# Patient Record
Sex: Male | Born: 1970 | Race: White | Hispanic: No | Marital: Single | Smoking: Former smoker
Health system: Southern US, Community
[De-identification: ages and names within clinical notes are randomized; demographics above are authoritative.]

## PROBLEM LIST (undated history)

## (undated) DIAGNOSIS — G893 Neoplasm related pain (acute) (chronic): Secondary | ICD-10-CM

## (undated) DIAGNOSIS — Z5111 Encounter for antineoplastic chemotherapy: Secondary | ICD-10-CM

## (undated) DIAGNOSIS — J189 Pneumonia, unspecified organism: Secondary | ICD-10-CM

## (undated) DIAGNOSIS — J9819 Other pulmonary collapse: Secondary | ICD-10-CM

## (undated) DIAGNOSIS — Z7189 Other specified counseling: Secondary | ICD-10-CM

## (undated) DIAGNOSIS — C801 Malignant (primary) neoplasm, unspecified: Secondary | ICD-10-CM

## (undated) HISTORY — PX: CHEST TUBE INSERTION: SHX231

## (undated) HISTORY — DX: Neoplasm related pain (acute) (chronic): G89.3

## (undated) HISTORY — DX: Encounter for antineoplastic chemotherapy: Z51.11

## (undated) HISTORY — DX: Other specified counseling: Z71.89

## (undated) HISTORY — PX: TYMPANOSTOMY TUBE PLACEMENT: SHX32

## (undated) HISTORY — PX: FRACTURE SURGERY: SHX138

## (undated) HISTORY — PX: HERNIA REPAIR: SHX51

## (undated) SURGERY — VIDEO BRONCHOSCOPY WITHOUT FLUORO
Anesthesia: Moderate Sedation

---

## 2001-09-20 ENCOUNTER — Emergency Department (HOSPITAL_COMMUNITY): Admission: EM | Admit: 2001-09-20 | Discharge: 2001-09-20 | Payer: Self-pay | Admitting: Emergency Medicine

## 2001-09-20 ENCOUNTER — Encounter: Payer: Self-pay | Admitting: Emergency Medicine

## 2002-08-23 ENCOUNTER — Emergency Department (HOSPITAL_COMMUNITY): Admission: EM | Admit: 2002-08-23 | Discharge: 2002-08-23 | Payer: Self-pay | Admitting: Emergency Medicine

## 2012-11-03 ENCOUNTER — Emergency Department (HOSPITAL_BASED_OUTPATIENT_CLINIC_OR_DEPARTMENT_OTHER)
Admission: EM | Admit: 2012-11-03 | Discharge: 2012-11-03 | Disposition: A | Discharge status: Home / Self Care | Payer: Self-pay | Attending: Emergency Medicine | Admitting: Emergency Medicine

## 2012-11-03 ENCOUNTER — Encounter (HOSPITAL_BASED_OUTPATIENT_CLINIC_OR_DEPARTMENT_OTHER): Payer: Self-pay | Admitting: *Deleted

## 2012-11-03 ENCOUNTER — Emergency Department (HOSPITAL_BASED_OUTPATIENT_CLINIC_OR_DEPARTMENT_OTHER): Payer: Self-pay

## 2012-11-03 DIAGNOSIS — R0781 Pleurodynia: Secondary | ICD-10-CM

## 2012-11-03 DIAGNOSIS — T148XXA Other injury of unspecified body region, initial encounter: Secondary | ICD-10-CM

## 2012-11-03 DIAGNOSIS — S62639A Displaced fracture of distal phalanx of unspecified finger, initial encounter for closed fracture: Secondary | ICD-10-CM | POA: Insufficient documentation

## 2012-11-03 DIAGNOSIS — S0190XA Unspecified open wound of unspecified part of head, initial encounter: Secondary | ICD-10-CM | POA: Insufficient documentation

## 2012-11-03 DIAGNOSIS — S6990XA Unspecified injury of unspecified wrist, hand and finger(s), initial encounter: Secondary | ICD-10-CM | POA: Insufficient documentation

## 2012-11-03 DIAGNOSIS — IMO0002 Reserved for concepts with insufficient information to code with codable children: Secondary | ICD-10-CM | POA: Insufficient documentation

## 2012-11-03 DIAGNOSIS — F17200 Nicotine dependence, unspecified, uncomplicated: Secondary | ICD-10-CM | POA: Insufficient documentation

## 2012-11-03 DIAGNOSIS — Z23 Encounter for immunization: Secondary | ICD-10-CM | POA: Insufficient documentation

## 2012-11-03 DIAGNOSIS — S298XXA Other specified injuries of thorax, initial encounter: Secondary | ICD-10-CM | POA: Insufficient documentation

## 2012-11-03 DIAGNOSIS — S0990XA Unspecified injury of head, initial encounter: Secondary | ICD-10-CM | POA: Insufficient documentation

## 2012-11-03 MED ORDER — TETANUS-DIPHTH-ACELL PERTUSSIS 5-2.5-18.5 LF-MCG/0.5 IM SUSP
0.5000 mL | Freq: Once | INTRAMUSCULAR | Status: AC
  Administered 2012-11-03: 0.5 mL via INTRAMUSCULAR
  Filled 2012-11-03: qty 0.5

## 2012-11-03 MED ORDER — OXYCODONE-ACETAMINOPHEN 5-325 MG PO TABS: 1.0000 | ORAL_TABLET | ORAL | Status: DC | PRN

## 2012-11-03 MED ORDER — OXYCODONE-ACETAMINOPHEN 5-325 MG PO TABS
1.0000 | ORAL_TABLET | Freq: Once | ORAL | Status: AC
  Administered 2012-11-03: 1 via ORAL
  Filled 2012-11-03 (×2): qty 1
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
## 2012-11-04 NOTE — ED Provider Notes (Signed)
Medical screening examination/treatment/procedure(s) were performed by non-physician practitioner and as supervising physician I was immediately available for consultation/collaboration.   Dierdre Mccalip W. Eleen Litz, MD 11/04/12 0927 

## 2016-04-15 ENCOUNTER — Encounter (HOSPITAL_BASED_OUTPATIENT_CLINIC_OR_DEPARTMENT_OTHER): Payer: Self-pay | Admitting: Emergency Medicine

## 2016-04-15 ENCOUNTER — Emergency Department (HOSPITAL_BASED_OUTPATIENT_CLINIC_OR_DEPARTMENT_OTHER): Payer: Medicaid Other

## 2016-04-15 ENCOUNTER — Inpatient Hospital Stay (HOSPITAL_BASED_OUTPATIENT_CLINIC_OR_DEPARTMENT_OTHER)
Admission: EM | Admit: 2016-04-15 | Discharge: 2016-04-23 | DRG: 871 | Disposition: A | Discharge status: Home / Self Care | Payer: Medicaid Other | Attending: Internal Medicine | Admitting: Internal Medicine

## 2016-04-15 DIAGNOSIS — D72829 Elevated white blood cell count, unspecified: Secondary | ICD-10-CM | POA: Diagnosis present

## 2016-04-15 DIAGNOSIS — R509 Fever, unspecified: Secondary | ICD-10-CM | POA: Diagnosis present

## 2016-04-15 DIAGNOSIS — F40240 Claustrophobia: Secondary | ICD-10-CM | POA: Diagnosis present

## 2016-04-15 DIAGNOSIS — J9819 Other pulmonary collapse: Secondary | ICD-10-CM | POA: Diagnosis present

## 2016-04-15 DIAGNOSIS — R918 Other nonspecific abnormal finding of lung field: Secondary | ICD-10-CM

## 2016-04-15 DIAGNOSIS — R05 Cough: Secondary | ICD-10-CM | POA: Diagnosis present

## 2016-04-15 DIAGNOSIS — J9809 Other diseases of bronchus, not elsewhere classified: Secondary | ICD-10-CM | POA: Diagnosis present

## 2016-04-15 DIAGNOSIS — R634 Abnormal weight loss: Secondary | ICD-10-CM | POA: Diagnosis present

## 2016-04-15 DIAGNOSIS — R627 Adult failure to thrive: Secondary | ICD-10-CM | POA: Diagnosis present

## 2016-04-15 DIAGNOSIS — R945 Abnormal results of liver function studies: Secondary | ICD-10-CM | POA: Diagnosis present

## 2016-04-15 DIAGNOSIS — F17210 Nicotine dependence, cigarettes, uncomplicated: Secondary | ICD-10-CM | POA: Diagnosis present

## 2016-04-15 DIAGNOSIS — J181 Lobar pneumonia, unspecified organism: Secondary | ICD-10-CM | POA: Diagnosis not present

## 2016-04-15 DIAGNOSIS — R7989 Other specified abnormal findings of blood chemistry: Secondary | ICD-10-CM | POA: Diagnosis present

## 2016-04-15 DIAGNOSIS — D473 Essential (hemorrhagic) thrombocythemia: Secondary | ICD-10-CM | POA: Diagnosis present

## 2016-04-15 DIAGNOSIS — A419 Sepsis, unspecified organism: Secondary | ICD-10-CM | POA: Diagnosis present

## 2016-04-15 DIAGNOSIS — Y95 Nosocomial condition: Secondary | ICD-10-CM | POA: Diagnosis present

## 2016-04-15 DIAGNOSIS — R0602 Shortness of breath: Secondary | ICD-10-CM | POA: Diagnosis not present

## 2016-04-15 DIAGNOSIS — D630 Anemia in neoplastic disease: Secondary | ICD-10-CM | POA: Diagnosis present

## 2016-04-15 DIAGNOSIS — C3492 Malignant neoplasm of unspecified part of left bronchus or lung: Secondary | ICD-10-CM

## 2016-04-15 DIAGNOSIS — J440 Chronic obstructive pulmonary disease with acute lower respiratory infection: Secondary | ICD-10-CM | POA: Diagnosis present

## 2016-04-15 DIAGNOSIS — G893 Neoplasm related pain (acute) (chronic): Secondary | ICD-10-CM | POA: Diagnosis present

## 2016-04-15 DIAGNOSIS — E43 Unspecified severe protein-calorie malnutrition: Secondary | ICD-10-CM | POA: Insufficient documentation

## 2016-04-15 DIAGNOSIS — C3482 Malignant neoplasm of overlapping sites of left bronchus and lung: Secondary | ICD-10-CM | POA: Diagnosis not present

## 2016-04-15 DIAGNOSIS — R059 Cough, unspecified: Secondary | ICD-10-CM | POA: Diagnosis present

## 2016-04-15 DIAGNOSIS — Z510 Encounter for antineoplastic radiation therapy: Secondary | ICD-10-CM | POA: Diagnosis present

## 2016-04-15 DIAGNOSIS — D649 Anemia, unspecified: Secondary | ICD-10-CM | POA: Diagnosis present

## 2016-04-15 DIAGNOSIS — D638 Anemia in other chronic diseases classified elsewhere: Secondary | ICD-10-CM | POA: Diagnosis present

## 2016-04-15 DIAGNOSIS — Z6823 Body mass index (BMI) 23.0-23.9, adult: Secondary | ICD-10-CM | POA: Diagnosis not present

## 2016-04-15 DIAGNOSIS — J189 Pneumonia, unspecified organism: Secondary | ICD-10-CM | POA: Diagnosis present

## 2016-04-15 DIAGNOSIS — F17200 Nicotine dependence, unspecified, uncomplicated: Secondary | ICD-10-CM | POA: Diagnosis present

## 2016-04-15 DIAGNOSIS — J9811 Atelectasis: Secondary | ICD-10-CM

## 2016-04-15 DIAGNOSIS — D72828 Other elevated white blood cell count: Secondary | ICD-10-CM

## 2016-04-15 HISTORY — DX: Pneumonia, unspecified organism: J18.9

## 2016-04-15 LAB — URINALYSIS, ROUTINE W REFLEX MICROSCOPIC
Bilirubin Urine: NEGATIVE
Glucose, UA: NEGATIVE mg/dL
Hgb urine dipstick: NEGATIVE
Ketones, ur: NEGATIVE mg/dL
Leukocytes, UA: NEGATIVE
Nitrite: NEGATIVE
Protein, ur: NEGATIVE mg/dL
Specific Gravity, Urine: >1.046 — ABNORMAL HIGH (ref 1.005–1.030)
pH: 7.5 (ref 5.0–8.0)

## 2016-04-15 LAB — TROPONIN I

## 2016-04-15 LAB — CBC WITH DIFFERENTIAL/PLATELET
BASOS PCT: 0 %
Basophils Absolute: 0.1 10*3/uL (ref 0.0–0.1)
Eosinophils Absolute: 0 10*3/uL (ref 0.0–0.7)
Eosinophils Relative: 0 %
HEMATOCRIT: 31.2 % — AB (ref 39.0–52.0)
Hemoglobin: 10.2 g/dL — ABNORMAL LOW (ref 13.0–17.0)
LYMPHS PCT: 8 %
Lymphs Abs: 1.8 10*3/uL (ref 0.7–4.0)
MCH: 30.4 pg (ref 26.0–34.0)
MCHC: 32.7 g/dL (ref 30.0–36.0)
MCV: 93.1 fL (ref 78.0–100.0)
MONO ABS: 1.4 10*3/uL — AB (ref 0.1–1.0)
MONOS PCT: 6 %
NEUTROS ABS: 20 10*3/uL — AB (ref 1.7–7.7)
Neutrophils Relative %: 86 %
Platelets: 585 10*3/uL — ABNORMAL HIGH (ref 150–400)
RBC: 3.35 MIL/uL — ABNORMAL LOW (ref 4.22–5.81)
RDW: 13.1 % (ref 11.5–15.5)
WBC: 23.2 10*3/uL — ABNORMAL HIGH (ref 4.0–10.5)

## 2016-04-15 LAB — CREATININE, SERUM: CREATININE: 0.74 mg/dL (ref 0.61–1.24)

## 2016-04-15 LAB — COMPREHENSIVE METABOLIC PANEL
ALT: 139 U/L — ABNORMAL HIGH (ref 17–63)
ANION GAP: 10 (ref 5–15)
AST: 74 U/L — ABNORMAL HIGH (ref 15–41)
Albumin: 2.3 g/dL — ABNORMAL LOW (ref 3.5–5.0)
Alkaline Phosphatase: 124 U/L (ref 38–126)
BILIRUBIN TOTAL: 0.4 mg/dL (ref 0.3–1.2)
BUN: 13 mg/dL (ref 6–20)
CO2: 24 mmol/L (ref 22–32)
Calcium: 8.5 mg/dL — ABNORMAL LOW (ref 8.9–10.3)
Chloride: 98 mmol/L — ABNORMAL LOW (ref 101–111)
Creatinine, Ser: 0.69 mg/dL (ref 0.61–1.24)
GFR calc Af Amer: >60 mL/min (ref >60)
Glucose, Bld: 170 mg/dL — ABNORMAL HIGH (ref 65–99)
POTASSIUM: 4.3 mmol/L (ref 3.5–5.1)
Sodium: 132 mmol/L — ABNORMAL LOW (ref 135–145)
TOTAL PROTEIN: 6.6 g/dL (ref 6.5–8.1)

## 2016-04-15 LAB — CBC
HEMATOCRIT: 27.8 % — AB (ref 39.0–52.0)
HEMOGLOBIN: 9.2 g/dL — AB (ref 13.0–17.0)
MCH: 30.6 pg (ref 26.0–34.0)
MCHC: 33.1 g/dL (ref 30.0–36.0)
MCV: 92.4 fL (ref 78.0–100.0)
Platelets: 541 10*3/uL — ABNORMAL HIGH (ref 150–400)
RBC: 3.01 MIL/uL — AB (ref 4.22–5.81)
RDW: 13.6 % (ref 11.5–15.5)
WBC: 20.7 10*3/uL — AB (ref 4.0–10.5)

## 2016-04-15 LAB — I-STAT CG4 LACTIC ACID, ED: Lactic Acid, Venous: 2.13 mmol/L (ref 0.5–1.9)

## 2016-04-15 MED ORDER — ALBUTEROL SULFATE (2.5 MG/3ML) 0.083% IN NEBU
5.0000 mg | INHALATION_SOLUTION | Freq: Once | RESPIRATORY_TRACT | Status: AC
  Administered 2016-04-15: 5 mg via RESPIRATORY_TRACT
  Filled 2016-04-15: qty 6

## 2016-04-15 MED ORDER — ENOXAPARIN SODIUM 40 MG/0.4ML ~~LOC~~ SOLN
40.0000 mg | SUBCUTANEOUS | Status: DC
  Administered 2016-04-16 – 2016-04-23 (×7): 40 mg via SUBCUTANEOUS
  Filled 2016-04-15 (×7): qty 0.4

## 2016-04-15 MED ORDER — VANCOMYCIN HCL 500 MG IV SOLR
INTRAVENOUS | Status: AC
  Filled 2016-04-15: qty 500

## 2016-04-15 MED ORDER — PIPERACILLIN-TAZOBACTAM 3.375 G IVPB
3.3750 g | Freq: Three times a day (TID) | INTRAVENOUS | Status: AC
  Administered 2016-04-15 – 2016-04-22 (×22): 3.375 g via INTRAVENOUS
  Filled 2016-04-15 (×24): qty 50

## 2016-04-15 MED ORDER — SODIUM CHLORIDE 0.9 % IV SOLN
1000.0000 mL | INTRAVENOUS | Status: DC
  Administered 2016-04-15 – 2016-04-18 (×7): 1000 mL via INTRAVENOUS

## 2016-04-15 MED ORDER — PIPERACILLIN-TAZOBACTAM 3.375 G IVPB 30 MIN
3.3750 g | Freq: Once | INTRAVENOUS | Status: AC
  Administered 2016-04-15: 3.375 g via INTRAVENOUS
  Filled 2016-04-15 (×2): qty 50

## 2016-04-15 MED ORDER — VANCOMYCIN HCL 500 MG IV SOLR
INTRAVENOUS | Status: AC
  Administered 2016-04-15: 500 mg
  Filled 2016-04-15: qty 500

## 2016-04-15 MED ORDER — OXYCODONE-ACETAMINOPHEN 5-325 MG PO TABS
1.0000 | ORAL_TABLET | ORAL | Status: DC | PRN
  Administered 2016-04-15 – 2016-04-17 (×7): 1 via ORAL
  Filled 2016-04-15 (×7): qty 1

## 2016-04-15 MED ORDER — VANCOMYCIN HCL IN DEXTROSE 1-5 GM/200ML-% IV SOLN
INTRAVENOUS | Status: AC
  Administered 2016-04-15: 1 g
  Filled 2016-04-15: qty 200

## 2016-04-15 MED ORDER — VANCOMYCIN HCL 10 G IV SOLR
20.0000 mg/kg | Freq: Once | INTRAVENOUS | Status: DC
Start: 2016-04-15 — End: 2016-04-16
  Filled 2016-04-15: qty 1270

## 2016-04-15 MED ORDER — SODIUM CHLORIDE 0.9 % IV BOLUS (SEPSIS)
1000.0000 mL | Freq: Once | INTRAVENOUS | Status: AC
  Administered 2016-04-15: 1000 mL via INTRAVENOUS

## 2016-04-15 MED ORDER — IOPAMIDOL (ISOVUE-370) INJECTION 76%
100.0000 mL | Freq: Once | INTRAVENOUS | Status: AC | PRN
  Administered 2016-04-15: 100 mL via INTRAVENOUS

## 2016-04-15 NOTE — ED Notes (Signed)
Report to Pamala Hurry, Therapist, sports at [employer].

## 2016-04-15 NOTE — Progress Notes (Signed)
Accepted this patient to [facility] from [facility], has no chron medical conditions, on no medications , hx PNA earlier this year, here now with fevers 103, cough and white-out L chest on CXR. CT shows prob lobar collapse on L and ? concern for endobronchial mass.  WBC 23k, prob has PNA as well.  BP's ok, HR up, admitting to tele bed/ inpatient, has rec'd IV vanc/ zosyn so far.   Kelly Splinter MD [facility] pgr [phone] 04/15/2016, 7:19 PM

## 2016-04-15 NOTE — ED Notes (Signed)
Patient transported to CT 

## 2016-04-15 NOTE — Progress Notes (Signed)
Admitted to room 1413 from [facility], patient A/O x4, noted on 3 liters of oxygen, dry cough noted. Patient reports " having high grade temps at night for at least a month.

## 2016-04-15 NOTE — ED Notes (Signed)
RESP 24, DISREGARD "0" in validated VS

## 2016-04-15 NOTE — ED Notes (Signed)
Critical Lactic Acid 2.13 results reported to MD and Vincente Liberty, RN

## 2016-04-15 NOTE — ED Triage Notes (Signed)
Patient states that he has had a cough x 1 month, lost 15 pounds in that last month. He is coughing up phglem. Patient reports that he has cut back on smoking because of this, Patient states that he is always SOB
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
## 2016-04-15 NOTE — ED Provider Notes (Signed)
[facility] DEPT [facility] Provider Note   CSN: [id number] Arrival date & time: 04/15/16  1548  By signing my name below, I, Emmanuella Mensah, attest that this documentation has been prepared under the direction and in the presence of [employer], PA-C. Electronically Signed: Judithann Sauger, ED Scribe. 04/15/16. 4:32 PM.   History   Chief Complaint Chief Complaint  Patient presents with  . Cough    HPI Comments: Douglas Kelly is a 45 y.o. male with a hx of pneumonia (May 2017) who presents to the Emergency Department complaining of gradually worsening persistent moderate productive cough with non-bloody, clear sputum onset one month ago. He reports associated left lower chest pain he describes as stabbing, shortness of breath, and persistent fever (highest 103.9 PTA). He notes that the left chest pain is worse with movement and deep breathing. He adds that he had two episodes non-bloody vomiting several days ago and since onset of his cough, he has a decreased appetite. No alleviating factors noted. Pt states that he has tried aspirin PTA with no relief. He has NKDA. He reports that he had these symptoms in May 2017 when he was diagnosed with pneumonia but these symptoms are worse than then. He denies any recent long car/plane trips, immobilizations, surgeries, or hx of cancer. He reports that he is a current smoker but has cut back from 2 pack/day to several cigarettes/day. He denies any abdominal pain, dysuria, hematuria, leg swelling, ear pain, generalized rash, or any other symptoms. Patient reports a 15 pound weight loss in the last month.  The history is provided by the patient. No language interpreter was used.    Past Medical History:  Diagnosis Date  . Pneumonia     Patient Active Problem List   Diagnosis Date Noted  . Cough 04/15/2016  . Fever 04/15/2016  . Leukocytosis 04/15/2016  . Pneumonia 04/15/2016  . Loss of weight 04/15/2016  . Normocytic anemia 04/15/2016  .  LFTs abnormal 04/15/2016  . Smoker 04/15/2016    History reviewed. No pertinent surgical history.     Home Medications    Prior to Admission medications   Medication Sig Start Date End Date Taking? Authorizing Provider  oxyCODONE-acetaminophen (PERCOCET/ROXICET) 5-325 MG per tablet Take 1 tablet by mouth every 4 (four) hours as needed for pain. Patient not taking: Reported on 04/15/2016 11/03/12   Glendell Docker, NP    Family History History reviewed. No pertinent family history.  Social History Social History  Substance Use Topics  . Smoking status: Current Every Day Smoker    Packs/day: 1.00    Types: Cigarettes  . Smokeless tobacco: Never Used  . Alcohol use No     Allergies   Patient has no known allergies.   Review of Systems Review of Systems  Constitutional: Positive for fever. Negative for chills.  HENT: Negative for ear pain, facial swelling and sore throat.   Respiratory: Positive for cough and shortness of breath.   Cardiovascular: Positive for chest pain. Negative for leg swelling.  Gastrointestinal: Positive for vomiting. Negative for abdominal pain and nausea.  Genitourinary: Negative for dysuria and hematuria.  Musculoskeletal: Positive for arthralgias. Negative for back pain.  Skin: Negative for rash and wound.  Neurological: Negative for headaches.  Psychiatric/Behavioral: The patient is not nervous/anxious.      Physical Exam Updated Vital Signs BP (!) 120/56 (BP Location: Right Arm)   Pulse 100   Temp (!) 100.5 F (38.1 C) (Oral)   Resp (!) 22   Ht  $'5\' 8"'G$  (1.727 m)   Wt 68.1 kg   SpO2 97%   BMI 22.83 kg/m   Physical Exam  Constitutional: He appears well-developed and well-nourished. No distress.  HENT:  Head: Normocephalic and atraumatic.  Mouth/Throat: Oropharynx is clear and moist. No oropharyngeal exudate.  Eyes: Conjunctivae are normal. Pupils are equal, round, and reactive to light. Right eye exhibits no discharge. Left eye  exhibits no discharge. No scleral icterus.  Neck: Normal range of motion. Neck supple. No thyromegaly present.  Cardiovascular: Normal rate, regular rhythm, normal heart sounds and intact distal pulses.  Exam reveals no gallop and no friction rub.   No murmur heard. Pulmonary/Chest: Effort normal. No stridor. No respiratory distress. He has decreased breath sounds (L lung). He has no wheezes. He has no rales.    Abdominal: Soft. Bowel sounds are normal. He exhibits no distension. There is no tenderness. There is no rebound and no guarding.  Musculoskeletal: He exhibits no edema.  Lymphadenopathy:    He has no cervical adenopathy.  Neurological: He is alert. Coordination normal.  Skin: Skin is warm and dry. No rash noted. He is not diaphoretic. No pallor.  Psychiatric: He has a normal mood and affect.  Nursing note and vitals reviewed.    ED Treatments / Results  DIAGNOSTIC STUDIES: Oxygen Saturation is 95% on RA, adequate by my interpretation.    COORDINATION OF CARE: 4:20 PM- Pt advised of plan for treatment and pt agrees. Pt will receive lab work, chest x-ray, and EKG for further evaluation.    Labs (all labs ordered are listed, but only abnormal results are displayed) Labs Reviewed  CBC WITH DIFFERENTIAL/PLATELET - Abnormal; Notable for the following:       Result Value   WBC 23.2 (*)    RBC 3.35 (*)    Hemoglobin 10.2 (*)    HCT 31.2 (*)    Platelets 585 (*)    Neutro Abs 20.0 (*)    Monocytes Absolute 1.4 (*)    All other components within normal limits  COMPREHENSIVE METABOLIC PANEL - Abnormal; Notable for the following:    Sodium 132 (*)    Chloride 98 (*)    Glucose, Bld 170 (*)    Calcium 8.5 (*)    Albumin 2.3 (*)    AST 74 (*)    ALT 139 (*)    All other components within normal limits  URINALYSIS, ROUTINE W REFLEX MICROSCOPIC - Abnormal; Notable for the following:    Specific Gravity, Urine >1.046 (*)    All other components within normal limits  CBC -  Abnormal; Notable for the following:    WBC 20.7 (*)    RBC 3.01 (*)    Hemoglobin 9.2 (*)    HCT 27.8 (*)    Platelets 541 (*)    All other components within normal limits  I-STAT CG4 LACTIC ACID, ED - Abnormal; Notable for the following:    Lactic Acid, Venous 2.13 (*)    All other components within normal limits  CULTURE, BLOOD (ROUTINE X 2)  CULTURE, BLOOD (ROUTINE X 2)  URINE CULTURE  CULTURE, BLOOD (ROUTINE X 2)  CULTURE, BLOOD (ROUTINE X 2)  CULTURE, EXPECTORATED SPUTUM-ASSESSMENT  GRAM STAIN  TROPONIN I  CREATININE, SERUM  HIV ANTIBODY (ROUTINE TESTING)  STREP PNEUMONIAE URINARY ANTIGEN  VITAMIN B12  FOLATE  IRON AND TIBC  FERRITIN  RETICULOCYTES  BASIC METABOLIC PANEL    EKG  EKG Interpretation None       Radiology  Dg Chest 2 View  Result Date: 04/15/2016 CLINICAL DATA:  Left-sided chest pain and dyspnea x5 months. History of pneumonia 7 months ago that filled entire left lung. EXAM: CHEST  2 VIEW COMPARISON:  11/03/2012 FINDINGS: There is volume loss with tracheal deviation to the left and near complete whiteout of the left hemithorax. Compensatory hyperinflation of the right lung. Only a few aerated portions of left upper lobe are noted near the apex. The entire heart is obscured as is the aorta. No suspicious osseous abnormality. IMPRESSION: Near complete whiteout of the left lung with volume loss and mediastinal shift to the left. Compensatory hyperinflation of the normal-appearing right lung. Electronically Signed   By: Ashley Royalty M.D.   On: 04/15/2016 16:19   Ct Angio Chest Pe W And/or Wo Contrast  Result Date: 04/15/2016 CLINICAL DATA:  Cough for 5 months with dyspnea and weight loss. Left-sided chest pain. Current smoker. EXAM: CT ANGIOGRAPHY CHEST WITH CONTRAST TECHNIQUE: Multidetector CT imaging of the chest was performed using the standard protocol during bolus administration of intravenous contrast. Multiplanar CT image reconstructions and MIPs were  obtained to evaluate the vascular anatomy. CONTRAST:  100 cc of Isovue 370 COMPARISON:  Same day CXR FINDINGS: Cardiovascular: The heart shifted toward the left hemithorax secondary to left lung collapse and volume loss. No acute pulmonary embolus. No aortic aneurysm or dissection. No pericardial effusion. Mediastinum/Nodes: There is prevascular lymphadenopathy measuring up to 12 mm short axis. No paratracheal, supraclavicular nor axillary lymphadenopathy. Lungs/Pleura: There is volume loss secondary to atelectasis and left lung collapse of the left upper lobe, lingula and left lower lobe. Tubular branching air like opacities are seen within the left lower consistent with air within bronchi and bronchioles. No confluent areas of cavitation noted to suggest pulmonary necrosis on current exam. Soft tissue densities are noted abruptly terminating the distal left main stem bronchus. An endoluminal mass or mucous causing the left lung collapse is not excluded. Faint ground-glass 7 mm in average and 5 mm in average nodular densities in the right upper lobe are noted, series 4 image 48 and 49. No effusion or pneumothorax on the right. Upper Abdomen: No acute abnormality within the abdomen. No enhancing hepatic mass. No definite adrenal nodule to the extent that they are included. Musculoskeletal: Nonacute Review of the MIP images confirms the above findings. IMPRESSION: 1. Left lung atelectasis/collapse with volume loss and shift of the mediastinum to the left. Soft tissue densities in the distal mainstem bronchus may be secondary to is inspissation. Endobronchial mass or lesion is not entirely excluded. No definite evidence of pulmonary necrosis. Mild prevascular lymphadenopathy. 2. No acute pulmonary embolus. 3. 7 mm and 5 mm right upper lobe ground-glass and nodular opacities. Initial follow-up with CT at 6-12 months is recommended to confirm persistence. If persistent, repeat CT is recommended every 2 years until 5  years of stability has been established. This recommendation follows the consensus statement: Guidelines for Management of Incidental Pulmonary Nodules Detected on CT Images: From the Fleischner Society 2017; Radiology 2017; 284:228-243. Electronically Signed   By: Ashley Royalty M.D.   On: 04/15/2016 18:17    Procedures Procedures (including critical care time)  Medications Ordered in ED Medications  vancomycin (VANCOCIN) 1,270 mg in sodium chloride 0.9 % 500 mL IVPB (1,270 mg Intravenous Not Given 04/15/16 1728)  vancomycin (VANCOCIN) 500 MG powder (not administered)  0.9 %  sodium chloride infusion (1,000 mLs Intravenous New Bag/Given 04/15/16 2333)  oxyCODONE-acetaminophen (PERCOCET/ROXICET) 5-325 MG per tablet  1 tablet (1 tablet Oral Given 04/15/16 2332)  enoxaparin (LOVENOX) injection 40 mg (not administered)  piperacillin-tazobactam (ZOSYN) IVPB 3.375 g (3.375 g Intravenous Given 04/15/16 2332)  albuterol (PROVENTIL) (2.5 MG/3ML) 0.083% nebulizer solution 5 mg (5 mg Nebulization Given 04/15/16 1648)  piperacillin-tazobactam (ZOSYN) IVPB 3.375 g (0 g Intravenous Stopped 04/15/16 1723)  vancomycin (VANCOCIN) 500 MG powder (500 mg  Given 04/15/16 1830)  vancomycin (VANCOCIN) 1-5 GM/200ML-% IVPB (  Stopped 04/15/16 1830)  iopamidol (ISOVUE-370) 76 % injection 100 mL (100 mLs Intravenous Contrast Given 04/15/16 1733)  sodium chloride 0.9 % bolus 1,000 mL (1,000 mLs Intravenous New Bag/Given 04/15/16 1905)    And  sodium chloride 0.9 % bolus 1,000 mL (1,000 mLs Intravenous Given 04/15/16 2110)     Initial Impression / Assessment and Plan / ED Course  Eliezer Mccoy, PA-C has reviewed the triage vital signs and the nursing notes.  Pertinent labs & imaging results that were available during my care of the patient were reviewed by me and considered in my medical decision making (see chart for details).  Clinical Course     CBC shows WBC 23.2. Hemoglobin 10.2. Lactate 2.13. CMP shows sodium 132,  chloride 98, glucose 170, AST 74, ALT 139, calcium 8.5, albumin 2.3. Troponin <0.03. UA shows specific gravity 1.046. CXR shows near complete white out of the left lung with volume loss and mediastinal shift to the left; compensatory hyperinflation of the normal-appearing right lung. CT angio shows "1. Left lung atelectasis/collapse with volume loss and shift of the mediastinum to the left. Soft tissue densities in the distal mainstem bronchus may be secondary to is inspissation. Endobronchial mass or lesion is not entirely excluded. No definite evidence of pulmonary necrosis. Mild prevascular lymphadenopathy. 2. No acute pulmonary embolus. 3. 7 mm and 5 mm right upper lobe ground-glass and nodular opacities. Initial follow-up with CT at 6-12 months is recommended to confirm persistence. If persistent, repeat CT is recommended every 2 years until 5 years of stability has been established." Vancomycin and Zosyn initiated in the ED. Concern for pneumonia versus possible underlying malignancy. I spoke with Dr. Lorrin Jackson who will admit the patient to [facility] telemetry for further evaluation and treatment. Patient will be transported via [place]. I discussed patient with Dr. Billy Fischer who guided the patient's management and agrees with plan.  Final Clinical Impressions(s) / ED Diagnoses   Final diagnoses:  Cough  Shortness of breath    New Prescriptions Current Discharge Medication List     I personally performed the services described in this documentation, which was scribed in my presence. The recorded information has been reviewed and is accurate.    [address] PA-C 04/16/16 5638    Gareth Morgan, MD 04/16/16 1329    Gareth Morgan, MD 04/16/16 1331

## 2016-04-16 DIAGNOSIS — J181 Lobar pneumonia, unspecified organism: Secondary | ICD-10-CM

## 2016-04-16 DIAGNOSIS — J9811 Atelectasis: Secondary | ICD-10-CM

## 2016-04-16 DIAGNOSIS — J189 Pneumonia, unspecified organism: Secondary | ICD-10-CM

## 2016-04-16 LAB — FOLATE: FOLATE: 14.1 ng/mL (ref >5.9)

## 2016-04-16 LAB — EXPECTORATED SPUTUM ASSESSMENT W GRAM STAIN, RFLX TO RESP C

## 2016-04-16 LAB — HIV ANTIBODY (ROUTINE TESTING W REFLEX): HIV Screen 4th Generation wRfx: NONREACTIVE

## 2016-04-16 LAB — BASIC METABOLIC PANEL
ANION GAP: 7 (ref 5–15)
BUN: 9 mg/dL (ref 6–20)
CALCIUM: 8 mg/dL — AB (ref 8.9–10.3)
CO2: 26 mmol/L (ref 22–32)
Chloride: 104 mmol/L (ref 101–111)
Creatinine, Ser: 0.72 mg/dL (ref 0.61–1.24)
GFR calc non Af Amer: >60 mL/min (ref >60)
Glucose, Bld: 156 mg/dL — ABNORMAL HIGH (ref 65–99)
POTASSIUM: 3.8 mmol/L (ref 3.5–5.1)
Sodium: 137 mmol/L (ref 135–145)

## 2016-04-16 LAB — IRON AND TIBC
IRON: 12 ug/dL — AB (ref 45–182)
SATURATION RATIOS: 10 % — AB (ref 17.9–39.5)
TIBC: 119 ug/dL — AB (ref 250–450)
UIBC: 107 ug/dL

## 2016-04-16 LAB — RETICULOCYTES
RBC.: 3.14 MIL/uL — AB (ref 4.22–5.81)
Retic Count, Absolute: 25.1 10*3/uL (ref 19.0–186.0)
Retic Ct Pct: 0.8 % (ref 0.4–3.1)

## 2016-04-16 LAB — STREP PNEUMONIAE URINARY ANTIGEN: STREP PNEUMO URINARY ANTIGEN: NEGATIVE

## 2016-04-16 LAB — FERRITIN: Ferritin: 918 ng/mL — ABNORMAL HIGH (ref 24–336)

## 2016-04-16 LAB — EXPECTORATED SPUTUM ASSESSMENT W REFEX TO RESP CULTURE

## 2016-04-16 LAB — APTT: APTT: 36 s (ref 24–36)

## 2016-04-16 LAB — PROTIME-INR
INR: 1.21
Prothrombin Time: 15.4 seconds — ABNORMAL HIGH (ref 11.4–15.2)

## 2016-04-16 LAB — VITAMIN B12: VITAMIN B 12: 330 pg/mL (ref 180–914)

## 2016-04-16 MED ORDER — ACETYLCYSTEINE 20 % IN SOLN
2.0000 mL | Freq: Three times a day (TID) | RESPIRATORY_TRACT | Status: DC
  Administered 2016-04-16 (×2): 2 mL via RESPIRATORY_TRACT
  Filled 2016-04-16 (×3): qty 4

## 2016-04-16 MED ORDER — BUDESONIDE 0.5 MG/2ML IN SUSP
0.5000 mg | Freq: Two times a day (BID) | RESPIRATORY_TRACT | Status: DC
  Administered 2016-04-16 – 2016-04-23 (×12): 0.5 mg via RESPIRATORY_TRACT
  Filled 2016-04-16 (×12): qty 2

## 2016-04-16 MED ORDER — NICOTINE 21 MG/24HR TD PT24
21.0000 mg | MEDICATED_PATCH | Freq: Every day | TRANSDERMAL | Status: DC
  Administered 2016-04-16 – 2016-04-23 (×7): 21 mg via TRANSDERMAL
  Filled 2016-04-16 (×7): qty 1

## 2016-04-16 MED ORDER — GUAIFENESIN ER 600 MG PO TB12
1200.0000 mg | ORAL_TABLET | Freq: Two times a day (BID) | ORAL | Status: DC
  Administered 2016-04-16 – 2016-04-23 (×14): 1200 mg via ORAL
  Filled 2016-04-16 (×14): qty 2

## 2016-04-16 MED ORDER — IPRATROPIUM-ALBUTEROL 0.5-2.5 (3) MG/3ML IN SOLN
3.0000 mL | RESPIRATORY_TRACT | Status: DC
  Administered 2016-04-16 (×2): 3 mL via RESPIRATORY_TRACT
  Filled 2016-04-16 (×2): qty 3

## 2016-04-16 MED ORDER — PREMIER PROTEIN SHAKE
11.0000 [oz_av] | Freq: Two times a day (BID) | ORAL | Status: DC
  Administered 2016-04-16 – 2016-04-23 (×9): 11 [oz_av] via ORAL
  Filled 2016-04-16 (×15): qty 325.31

## 2016-04-16 MED ORDER — ACETYLCYSTEINE 10 % IN SOLN
2.0000 mL | Freq: Three times a day (TID) | RESPIRATORY_TRACT | Status: DC
Start: 2016-04-16 — End: 2016-04-16
  Filled 2016-04-16: qty 4

## 2016-04-16 MED ORDER — VANCOMYCIN HCL IN DEXTROSE 1-5 GM/200ML-% IV SOLN
1000.0000 mg | Freq: Three times a day (TID) | INTRAVENOUS | Status: DC
  Administered 2016-04-16 – 2016-04-19 (×11): 1000 mg via INTRAVENOUS
  Filled 2016-04-16 (×10): qty 200

## 2016-04-16 NOTE — Consult Note (Signed)
Name: Douglas Kelly MRN: [id number] DOB: 11/09/1970    ADMISSION DATE:  04/15/2016 CONSULTATION DATE: 12/8  REFERRING MD :  [facility] Dr. 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 He was treated for Pna in 09/2015 with abx and dis improve but he has continued to cough with gray sputum, febrile to 103, night sweats, 20 lb weight loss.  Admitted and CT scan reveals near complete whiteout of left lung, air bronchogram's and 2 5-7 mm rt lung nodules.  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 He is relatively healthy otherwise. PCCM asked to evaluate. PAST MEDICAL HISTORY :   has a past medical history of Pneumonia.  has no past surgical history on file. Prior to Admission medications   Medication Sig Start Date End Date Taking? Authorizing Provider  oxyCODONE-acetaminophen (PERCOCET/ROXICET) 5-325 MG per tablet Take 1 tablet by mouth every 4 (four) hours as needed for pain. Patient not taking: Reported on 04/15/2016 11/03/12   Glendell Docker, NP   No Known Allergies  FAMILY HISTORY:  family history is not on file. SOCIAL HISTORY:  reports that he has been smoking Cigarettes.  He has been smoking about 1.00 pack per day. He has never used smokeless tobacco. He reports that he does not drink alcohol or use drugs.  REVIEW OF SYSTEMS:   10 point review of system taken, please see HPI for positives and negatives. .  SUBJECTIVE: NAD at rest  VITAL SIGNS: Temp:  [98 F (36.7 C)-101.4 F (38.6 C)] 98.4 F (36.9 C) (12/08 0630) Pulse Rate:  [80-118] 80 (12/08 0630) Resp:  [0-24] 22 (12/08 0630) BP: (110-124)/(56-70) 113/67 (12/08 0630) SpO2:  [90  %-98 %] 98 % (12/08 1020) Weight:  [140 lb (63.5 kg)-150 lb 2.1 oz (68.1 kg)] 150 lb 2.1 oz (68.1 kg) (12/07 2050)  PHYSICAL EXAMINATION: General:  Thin wm nad at rest Neuro:  No jvd/lan Cardiovascular:  HSR RRR Lungs:  Decreased bs left with dull to percussion left Abdomen:  Soft +bs Musculoskeletal:  Intact Skin:  Warm and dry   Recent Labs Lab 04/15/16 1616 04/15/16 2252 04/16/16 0454  NA 132*  --  137  K 4.3  --  3.8  CL 98*  --  104  CO2 24  --  26  BUN 13  --  9  CREATININE 0.69 0.74 0.72  GLUCOSE 170*  --  156*    Recent Labs Lab 04/15/16 1616 04/15/16 2252  HGB 10.2* 9.2*  HCT 31.2* 27.8*  WBC 23.2* 20.7*  PLT 585* 541*   Dg Chest 2 View  Result Date: 04/15/2016 CLINICAL DATA:  Left-sided chest pain and dyspnea x5 months. History of pneumonia 7 months ago that filled entire left lung. EXAM: CHEST  2 VIEW COMPARISON:  11/03/2012 FINDINGS: There is volume loss with tracheal deviation to the left and near complete whiteout of the left hemithorax. Compensatory hyperinflation of the right lung. Only a few aerated portions of left upper lobe are noted near the apex. The entire heart is obscured as is the aorta. No suspicious osseous abnormality. IMPRESSION: Near  complete whiteout of the left lung with volume loss and mediastinal shift to the left. Compensatory hyperinflation of the normal-appearing right lung. Electronically Signed   By: Ashley Royalty M.D.   On: 04/15/2016 16:19   Ct Angio Chest Pe W And/or Wo Contrast  Result Date: 04/15/2016 CLINICAL DATA:  Cough for 5 months with dyspnea and weight loss. Left-sided chest pain. Current smoker. EXAM: CT ANGIOGRAPHY CHEST WITH CONTRAST TECHNIQUE: Multidetector CT imaging of the chest was performed using the standard protocol during bolus administration of intravenous contrast. Multiplanar CT image reconstructions and MIPs were obtained to evaluate the vascular anatomy. CONTRAST:  100 cc of Isovue 370 COMPARISON:  Same day  CXR FINDINGS: Cardiovascular: The heart shifted toward the left hemithorax secondary to left lung collapse and volume loss. No acute pulmonary embolus. No aortic aneurysm or dissection. No pericardial effusion. Mediastinum/Nodes: There is prevascular lymphadenopathy measuring up to 12 mm short axis. No paratracheal, supraclavicular nor axillary lymphadenopathy. Lungs/Pleura: There is volume loss secondary to atelectasis and left lung collapse of the left upper lobe, lingula and left lower lobe. Tubular branching air like opacities are seen within the left lower consistent with air within bronchi and bronchioles. No confluent areas of cavitation noted to suggest pulmonary necrosis on current exam. Soft tissue densities are noted abruptly terminating the distal left main stem bronchus. An endoluminal mass or mucous causing the left lung collapse is not excluded. Faint ground-glass 7 mm in average and 5 mm in average nodular densities in the right upper lobe are noted, series 4 image 48 and 49. No effusion or pneumothorax on the right. Upper Abdomen: No acute abnormality within the abdomen. No enhancing hepatic mass. No definite adrenal nodule to the extent that they are included. Musculoskeletal: Nonacute Review of the MIP images confirms the above findings. IMPRESSION: 1. Left lung atelectasis/collapse with volume loss and shift of the mediastinum to the left. Soft tissue densities in the distal mainstem bronchus may be secondary to is inspissation. Endobronchial mass or lesion is not entirely excluded. No definite evidence of pulmonary necrosis. Mild prevascular lymphadenopathy. 2. No acute pulmonary embolus. 3. 7 mm and 5 mm right upper lobe ground-glass and nodular opacities. Initial follow-up with CT at 6-12 months is recommended to confirm persistence. If persistent, repeat CT is recommended every 2 years until 5 years of stability has been established. This recommendation follows the consensus statement:  Guidelines for Management of Incidental Pulmonary Nodules Detected on CT Images: From the Fleischner Society 2017; Radiology 2017; 284:228-243. Electronically Signed   By: Ashley Royalty M.D.   On: 04/15/2016 18:17    ASSESSMENT  Principal Problem:   Collapse of left lung, hx of pneumonia 5 months ago Active Problems:   Cough   Fever   Leukocytosis   Loss of weight   Normocytic anemia   LFTs abnormal   Smoker since age 14 up to 2 ppd now 5 cigarettes a day.   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 He was treated for Pna in 09/2015 with abx and dis improve but he has continued to cough with gray sputum, febrile to 103, night sweats, 20 lb weight loss.  Admitted and CT scan reveals near complete whiteout of left lung, air bronchogram's and 2 5-7 mm rt lung nodules.  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 He is relatively healthy otherwise. PCCM asked to evaluate.   PLAN: Agree with abx. Pulmonary toilet Sputum culture if obtainable He will FOB and possibly a Vats in future. MD to review.    Richardson Landry Minor ACNP Maryanna Shape PCCM Pager [phone] till 3 pm If no answer page [phone] 04/16/2016, 10:27 AM   ATTENDING NOTE / ATTESTATION NOTE :   I have discussed the case with the resident/APP  Richardson Landry Minor NP.   I agree with the resident/APP's  history, physical examination, assessment, and plans.    I have edited the above note and modified it according to our agreed history, physical examination, assessment and plan.   Briefly, patient with significant smoking history, 50 PY, down to 5 cigs/d,  treated with pneumonia as an outpatient in May 2017. He clinically improved but symptoms of cough and SOB recurred. He started coughing with yellow sputum last 2-3 months. He was more SOB than usual the last week  with associated  fevers.  30 lb weight loss over 2-3 mos 2/2 anorexia.  He presents with worsening shortness of breath. Chest CT scan with whiteout of the left lung. He has air bronchograms at the left mid to lower lung zones. Likely with mucous plug in the left upper lung zone. It's hard to decipher whether there is an endobronchial lesion in the left upper lung.   Patient seen, comfortable, not in distress. Vital signs stable. Decreased to absent breath sounds in the left lung. Good S1 and S2. Abdomen was benign. No edema. Rest of exam per Richardson Landry.   Assement/Plan : Left lung collapse in a smoker. History of pneumonia in May 2017. Differentials include: Underlying malignancy causing airway obstruction and postobstructive pneumonia. He can just be having mucous plug and subsequent PNA. Pt likely with COPD but not in exacerbation.  - Pt is currently comfortable and not requiring a lot of o2.  He is just on 2L and o2 sats are > 90%.  - Plan on doing diagnostic bronchoscopy on Monday, Dec 11, 9am c/o Dr. Lake Bells.  - Cont abx pending culture (Vanc/Zosyn) - CPT q4, Mucomyst q8, Duoneb q4, pulmicort BID - CXR on Mon day prior to bronch.  - will check coags now prior to bronch   Family :Family updated at length today.  Plan discussed with patient and his girlfriend and pt's sister.   PCCM will not follow him over the weekend unless if with clinical deterioration.   Monica Becton, MD 04/16/2016, 1:13 PM [place] [facility] Pager [phone] After 3 pm or if no answer, call [phone]

## 2016-04-16 NOTE — Progress Notes (Signed)
Initial Nutrition Assessment  DOCUMENTATION CODES:   Severe malnutrition in context of acute illness/injury  INTERVENTION:  - Will order Premier Protein BID, each supplement provides 160 kcal and 30 grams of protein.  - Continue to encourage PO intakes of meals and supplements. - RD will continue to monitor for additional nutrition-related needs.  NUTRITION DIAGNOSIS:   Unintentional weight loss related to poor appetite, chronic illness as evidenced by per patient/family report, percent weight loss.  GOAL:   Patient will meet greater than or equal to 90% of their needs  MONITOR:   PO intake, Supplement acceptance, Weight trends, Labs, I & O's  REASON FOR ASSESSMENT:   Malnutrition Screening Tool  ASSESSMENT:   45 y.o. male with no sig PMHx, on no medications presented to [facility] with 3-5 month history of cough, sweats , wt loss and poor appetite.  CXR showed complete white out of L chest with mediastinal L shift consistent with some lung collapse.  CT chest showed L-sided lobar collapse with L shift, air bronchograms.  Pt seen for MST. BMI indicates normal weight. No intakes documented since admission, but pt reports almost 100% completion of breakfast (omelete, grits, white toast, banana, strawberry yogurt, coffee, and hot chocolate). Pt received lunch tray as RD was entering his room. Significant other is at bedside and has ordered a guest tray for lunch; unsure if some of the intake of breakfast meal could have been shared rather than fully consumed by the patient.   He states that over the past "few months" he has had symptoms outlined above, from H&P. He states that he usually has a very good appetite and would eat a large breakfast and have 2 plates of food each for lunch and for dinner. He states that over the past 1 month he has lost 20 lbs d/t symptoms and decreasing appetite and intakes. He reports that for the past ~1 month he mainly has only eaten a few bites at  each meal which is very unusual for him. Pt reports that since admission appetite has begun to return. He denies abdominal pain or nausea with or without PO intakes PTA. He states he was having L-sided flank pain but that this did not affect abdominal area.   Physical assessment unable to be performed per request while pt was eating lunch; will complete at follow-up and document findings at that time. Per reported weight loss, pt has lost 12% body weight which is significant for both 1 month and 5 month time frames. No previous weight hx available for comparison to pt's report.   Medications reviewed. Labs reviewed; Ca: 8 mg/dL, AST and ALT elevated.   IVF: NS @ 125 mL/hr.    Diet Order:  Diet regular Room service appropriate? Yes; Fluid consistency: Thin  Skin:  Reviewed, no issues  Last BM:  12/7  Height:   Ht Readings from Last 1 Encounters:  04/15/16 '5\' 8"'$  (1.727 m)    Weight:   Wt Readings from Last 1 Encounters:  04/15/16 150 lb 2.1 oz (68.1 kg)    Ideal Body Weight:  70 kg  BMI:  Body mass index is 22.83 kg/m.  Estimated Nutritional Needs:   Kcal:  1700-1905 (25-28 kcal/kg)  Protein:  68-81 grams (1-1.2 grams/kg)  Fluid:  1.8 L/day  EDUCATION NEEDS:   No education needs identified at this time    Jarome Matin, MS, RD, LDN, CNSC Inpatient Clinical Dietitian Pager # [phone] After hours/weekend pager # [phone]

## 2016-04-16 NOTE — Progress Notes (Signed)
PROGRESS NOTE                                                                                                                                                                                                             Patient Demographics:    Douglas Kelly, is a 45 y.o. male, DOB - [birth year], QAS:[id number]  Admit date - 04/15/2016   Admitting Physician Roney Jaffe, MD  Outpatient Primary MD for the patient is No primary care provider on file.  LOS - 1   Chief Complaint  Patient presents with  . Cough       Brief Narrative   45 y.o. male with no sig PMHx, on no medications presents with of cough, sweats , wt loss and poor appetite.  CXR showed complete white out of L chest w mediastinal L shift c/w some lung collapse.  CT chest showed Lsided lobar collapse with L shift, air bronchograms.  Asked to see for admission.     Subjective:    Douglas Kelly today has, No headache, No chest pain, No abdominal pain - No Nausea, Report generalized weakness and cough.  Assessment  & Plan :    Principal Problem:   Pneumonia Active Problems:   Cough   Fever   Leukocytosis   Loss of weight   Normocytic anemia   LFTs abnormal   Smoker  Sepsis due to Pneumonia with left lung collapse - Patient presents with fever, tachycardia, tachypnea, elevated lactic acid and leukocytosis, sepsis criteria met on admission. - This is secondary to pneumonia with left lung collapse, follow on cultures and sputum cultures. - Continue with IV fluids,  - Continue with broad-spectrum antibiotics including IV vancomycin and Zosyn giving severity of illness. - reports weight loss, night sweats, and significant weight loss, pulmonary consulted for further evaluation. - Continue chest PT including flutter valve and fist, Mucinex.  Right lung nodules - Pulm  consulted  Tobacco abuse - Counseled, requesting a nicotine patch  Anemia - Anemia of chronic illness, B12 level is  borderline, will start on supplement  Elevated LFTs - Most likely to sepsis, will monitor closely      Code Status : Full  Family Communication  : wife at bedside  Disposition Plan  : pending further work up.   Consults  :  PCCM  Procedures  :  none  DVT Prophylaxis  :  Lovenox   Lab Results  Component Value Date   PLT 541 (H) 04/15/2016    Antibiotics  :   Anti-infectives    Start     Dose/Rate Route Frequency Ordered Stop   04/16/16 0600  vancomycin (VANCOCIN) IVPB 1000 mg/200 mL premix     1,000 mg 200 mL/hr over 60 Minutes Intravenous Every 8 hours 04/16/16 0242     04/15/16 2215  piperacillin-tazobactam (ZOSYN) IVPB 3.375 g     3.375 g 12.5 mL/hr over 240 Minutes Intravenous Every 8 hours 04/15/16 2210     04/15/16 1828  vancomycin (VANCOCIN) 500 MG powder    Comments:  Peel, Adrienne   : cabinet override      04/15/16 1828 04/16/16 0644   04/15/16 1649  vancomycin (VANCOCIN) 500 MG powder    Comments:  Peel, Adrienne   : cabinet override      04/15/16 1649 04/15/16 1830   04/15/16 1649  vancomycin (VANCOCIN) 1-5 GM/200ML-% IVPB    Comments:  Peel, Adrienne   : cabinet override      04/15/16 1649 04/15/16 1830   04/15/16 1645  piperacillin-tazobactam (ZOSYN) IVPB 3.375 g     3.375 g 100 mL/hr over 30 Minutes Intravenous  Once 04/15/16 1638 04/15/16 1723   04/15/16 1645  vancomycin (VANCOCIN) 1,270 mg in sodium chloride 0.9 % 500 mL IVPB  Status:  Discontinued     20 mg/kg  63.5 kg 250 mL/hr over 120 Minutes Intravenous  Once 04/15/16 1638 04/16/16 0241        Objective:   Vitals:   04/15/16 2050 04/15/16 2200 04/16/16 0207 04/16/16 0630  BP: (!) 120/56  120/68 113/67  Pulse: 100  89 80  Resp: (!) 22  (!) 21 (!) 22  Temp: (!) 101.1 F (38.4 C) (!) 100.5 F (38.1 C) 99.6 F (37.6 C) 98.4 F (36.9 C)  TempSrc: Oral Oral Oral Oral  SpO2: 97%  98% 97%  Weight: 68.1 kg (150 lb 2.1 oz)     Height: '5\' 8"'$  (1.727 m)       Wt Readings from Last 3  Encounters:  04/15/16 68.1 kg (150 lb 2.1 oz)  11/03/12 67.1 kg (148 lb)     Intake/Output Summary (Last 24 hours) at 04/16/16 1000 Last data filed at 04/16/16 0630  Gross per 24 hour  Intake          1606.25 ml  Output             1000 ml  Net           606.25 ml     Physical Exam  Awake Alert, Oriented X 3, No new F.N deficits, Normal affect Supple Neck,No JVD,  Symmetrical Chest wall movement, decreased  air movement on left, no wheezing. RRR,No Gallops,Rubs or new Murmurs, No Parasternal Heave +ve B.Sounds, Abd Soft, No tenderness, No rebound - guarding or rigidity. No Cyanosis, Clubbing or edema, No new Rash or bruise     Data Review:    CBC  Recent Labs Lab 04/15/16 1616 04/15/16 2252  WBC 23.2* 20.7*  HGB 10.2* 9.2*  HCT 31.2* 27.8*  PLT 585* 541*  MCV 93.1 92.4  MCH 30.4 30.6  MCHC 32.7 33.1  RDW 13.1 13.6  LYMPHSABS 1.8  --   MONOABS 1.4*  --   EOSABS 0.0  --   BASOSABS 0.1  --     Chemistries   Recent Labs Lab 04/15/16  1616 04/15/16 2252 04/16/16 0454  NA 132*  --  137  K 4.3  --  3.8  CL 98*  --  104  CO2 24  --  26  GLUCOSE 170*  --  156*  BUN 13  --  9  CREATININE 0.69 0.74 0.72  CALCIUM 8.5*  --  8.0*  AST 74*  --   --   ALT 139*  --   --   ALKPHOS 124  --   --   BILITOT 0.4  --   --    ------------------------------------------------------------------------------------------------------------------ No results for input(s): CHOL, HDL, LDLCALC, TRIG, CHOLHDL, LDLDIRECT in the last 72 hours.  No results found for: HGBA1C ------------------------------------------------------------------------------------------------------------------ No results for input(s): TSH, T4TOTAL, T3FREE, THYROIDAB in the last 72 hours.  Invalid input(s): FREET3 ------------------------------------------------------------------------------------------------------------------  Recent Labs  04/16/16 0454  VITAMINB12 330  FOLATE 14.1  FERRITIN 918*    TIBC 119*  IRON 12*  RETICCTPCT 0.8    Coagulation profile No results for input(s): INR, PROTIME in the last 168 hours.  No results for input(s): DDIMER in the last 72 hours.  Cardiac Enzymes  Recent Labs Lab 04/15/16 1616  TROPONINI <0.03   ------------------------------------------------------------------------------------------------------------------ No results found for: BNP  Inpatient Medications  Scheduled Meds: . enoxaparin (LOVENOX) injection  40 mg Subcutaneous Q24H  . guaiFENesin  1,200 mg Oral BID  . nicotine  21 mg Transdermal Daily  . piperacillin-tazobactam (ZOSYN)  IV  3.375 g Intravenous Q8H  . vancomycin  1,000 mg Intravenous Q8H   Continuous Infusions: . sodium chloride 1,000 mL (04/16/16 0802)   PRN Meds:.oxyCODONE-acetaminophen  Micro Results Recent Results (from the past 240 hour(s))  Blood culture (routine x 2)     Status: None (Preliminary result)   Collection Time: 04/15/16  4:40 PM  Result Value Ref Range Status   Specimen Description BLOOD LEFT HAND  Final   Special Requests BOTTLES DRAWN AEROBIC AND ANAEROBIC 5CC EACH  Final   Culture   Final    NO GROWTH < 24 HOURS Performed at [facility]    Report Status PENDING  Incomplete  Blood culture (routine x 2)     Status: None (Preliminary result)   Collection Time: 04/15/16  4:55 PM  Result Value Ref Range Status   Specimen Description BLOOD RIGHT HAND  Final   Special Requests BOTTLES DRAWN AEROBIC AND ANAEROBIC 5CC EACH  Final   Culture   Final    NO GROWTH < 24 HOURS Performed at [facility]    Report Status PENDING  Incomplete    Radiology Reports Dg Chest 2 View  Result Date: 04/15/2016 CLINICAL DATA:  Left-sided chest pain and dyspnea x5 months. History of pneumonia 7 months ago that filled entire left lung. EXAM: CHEST  2 VIEW COMPARISON:  11/03/2012 FINDINGS: There is volume loss with tracheal deviation to the left and near complete whiteout of the left  hemithorax. Compensatory hyperinflation of the right lung. Only a few aerated portions of left upper lobe are noted near the apex. The entire heart is obscured as is the aorta. No suspicious osseous abnormality. IMPRESSION: Near complete whiteout of the left lung with volume loss and mediastinal shift to the left. Compensatory hyperinflation of the normal-appearing right lung. Electronically Signed   By: Ashley Royalty M.D.   On: 04/15/2016 16:19   Ct Angio Chest Pe W And/or Wo Contrast  Result Date: 04/15/2016 CLINICAL DATA:  Cough for 5 months with dyspnea and weight loss. Left-sided  chest pain. Current smoker. EXAM: CT ANGIOGRAPHY CHEST WITH CONTRAST TECHNIQUE: Multidetector CT imaging of the chest was performed using the standard protocol during bolus administration of intravenous contrast. Multiplanar CT image reconstructions and MIPs were obtained to evaluate the vascular anatomy. CONTRAST:  100 cc of Isovue 370 COMPARISON:  Same day CXR FINDINGS: Cardiovascular: The heart shifted toward the left hemithorax secondary to left lung collapse and volume loss. No acute pulmonary embolus. No aortic aneurysm or dissection. No pericardial effusion. Mediastinum/Nodes: There is prevascular lymphadenopathy measuring up to 12 mm short axis. No paratracheal, supraclavicular nor axillary lymphadenopathy. Lungs/Pleura: There is volume loss secondary to atelectasis and left lung collapse of the left upper lobe, lingula and left lower lobe. Tubular branching air like opacities are seen within the left lower consistent with air within bronchi and bronchioles. No confluent areas of cavitation noted to suggest pulmonary necrosis on current exam. Soft tissue densities are noted abruptly terminating the distal left main stem bronchus. An endoluminal mass or mucous causing the left lung collapse is not excluded. Faint ground-glass 7 mm in average and 5 mm in average nodular densities in the right upper lobe are noted, series 4  image 48 and 49. No effusion or pneumothorax on the right. Upper Abdomen: No acute abnormality within the abdomen. No enhancing hepatic mass. No definite adrenal nodule to the extent that they are included. Musculoskeletal: Nonacute Review of the MIP images confirms the above findings. IMPRESSION: 1. Left lung atelectasis/collapse with volume loss and shift of the mediastinum to the left. Soft tissue densities in the distal mainstem bronchus may be secondary to is inspissation. Endobronchial mass or lesion is not entirely excluded. No definite evidence of pulmonary necrosis. Mild prevascular lymphadenopathy. 2. No acute pulmonary embolus. 3. 7 mm and 5 mm right upper lobe ground-glass and nodular opacities. Initial follow-up with CT at 6-12 months is recommended to confirm persistence. If persistent, repeat CT is recommended every 2 years until 5 years of stability has been established. This recommendation follows the consensus statement: Guidelines for Management of Incidental Pulmonary Nodules Detected on CT Images: From the Fleischner Society 2017; Radiology 2017; 284:228-243. Electronically Signed   By: Ashley Royalty M.D.   On: 04/15/2016 18:17     Alazia Crocket M.D on 04/16/2016 at 10:00 AM  Between 7am to 7pm - Pager - [phone]  After 7pm go to [email or web address] - password [facility]  [facility] -  Office  [phone]

## 2016-04-16 NOTE — Progress Notes (Signed)
Pharmacy Antibiotic Note  Douglas Kelly is a 44 y.o. male admitted on 04/15/2016 with pneumonia.  Pharmacy has been consulted for Vancomycin and Zosyn dosing.  Plan: Vancomycin '1000mg'$  IV every 8 hours.  Goal trough 15-20 mcg/mL. Zosyn 3.375g IV q8h (4 hour infusion).  Height: '5\' 8"'$  (172.7 cm) Weight: 150 lb 2.1 oz (68.1 kg) IBW/kg (Calculated) : 68.4  Temp (24hrs), Avg:100 F (37.8 C), Min:98 F (36.7 C), Max:101.4 F (38.6 C)   Recent Labs Lab 04/15/16 1616 04/15/16 1659 04/15/16 2252  WBC 23.2*  --  20.7*  CREATININE 0.69  --  0.74  LATICACIDVEN  --  2.13*  --     Estimated Creatinine Clearance: 110 mL/min (by C-G formula based on SCr of 0.74 mg/dL).    No Known Allergies  Antimicrobials this admission: Vancomycin 04/15/2016 >> Zosyn 04/15/2016 >>   Dose adjustments this admission: -  Microbiology results: pending  Thank you for allowing pharmacy to be a part of this patient's care.  Douglas Kelly 04/16/2016 2:42 AM

## 2016-04-17 DIAGNOSIS — E43 Unspecified severe protein-calorie malnutrition: Secondary | ICD-10-CM | POA: Insufficient documentation

## 2016-04-17 LAB — COMPREHENSIVE METABOLIC PANEL
ALK PHOS: 147 U/L — AB (ref 38–126)
ALT: 205 U/L — AB (ref 17–63)
ANION GAP: 7 (ref 5–15)
AST: 81 U/L — ABNORMAL HIGH (ref 15–41)
Albumin: 2.2 g/dL — ABNORMAL LOW (ref 3.5–5.0)
BILIRUBIN TOTAL: 0.3 mg/dL (ref 0.3–1.2)
BUN: 7 mg/dL (ref 6–20)
CALCIUM: 8.3 mg/dL — AB (ref 8.9–10.3)
CO2: 27 mmol/L (ref 22–32)
CREATININE: 0.71 mg/dL (ref 0.61–1.24)
Chloride: 103 mmol/L (ref 101–111)
Glucose, Bld: 124 mg/dL — ABNORMAL HIGH (ref 65–99)
Potassium: 4.1 mmol/L (ref 3.5–5.1)
Sodium: 137 mmol/L (ref 135–145)
TOTAL PROTEIN: 6.1 g/dL — AB (ref 6.5–8.1)

## 2016-04-17 LAB — CBC
HEMATOCRIT: 30.3 % — AB (ref 39.0–52.0)
HEMOGLOBIN: 9.9 g/dL — AB (ref 13.0–17.0)
MCH: 30.5 pg (ref 26.0–34.0)
MCHC: 32.7 g/dL (ref 30.0–36.0)
MCV: 93.2 fL (ref 78.0–100.0)
Platelets: 610 10*3/uL — ABNORMAL HIGH (ref 150–400)
RBC: 3.25 MIL/uL — ABNORMAL LOW (ref 4.22–5.81)
RDW: 13.5 % (ref 11.5–15.5)
WBC: 17.9 10*3/uL — ABNORMAL HIGH (ref 4.0–10.5)

## 2016-04-17 LAB — URINE CULTURE: Culture: NO GROWTH

## 2016-04-17 LAB — EXPECTORATED SPUTUM ASSESSMENT W REFEX TO RESP CULTURE

## 2016-04-17 LAB — EXPECTORATED SPUTUM ASSESSMENT W GRAM STAIN, RFLX TO RESP C

## 2016-04-17 MED ORDER — ALBUTEROL SULFATE (2.5 MG/3ML) 0.083% IN NEBU
2.5000 mg | INHALATION_SOLUTION | RESPIRATORY_TRACT | Status: DC | PRN
  Administered 2016-04-17: 2.5 mg via RESPIRATORY_TRACT
  Filled 2016-04-17: qty 3

## 2016-04-17 MED ORDER — IBUPROFEN 200 MG PO TABS
400.0000 mg | ORAL_TABLET | Freq: Four times a day (QID) | ORAL | Status: DC | PRN
  Administered 2016-04-17: 400 mg via ORAL
  Filled 2016-04-17: qty 2

## 2016-04-17 MED ORDER — OXYCODONE-ACETAMINOPHEN 5-325 MG PO TABS
1.0000 | ORAL_TABLET | ORAL | Status: DC | PRN
  Administered 2016-04-17 – 2016-04-19 (×12): 2 via ORAL
  Filled 2016-04-17 (×12): qty 2

## 2016-04-17 MED ORDER — ACETYLCYSTEINE 20 % IN SOLN: 2.0000 mL | Freq: Three times a day (TID) | RESPIRATORY_TRACT | Status: DC

## 2016-04-17 MED ORDER — ACETYLCYSTEINE 20 % IN SOLN
2.0000 mL | Freq: Three times a day (TID) | RESPIRATORY_TRACT | Status: DC
  Administered 2016-04-17: 15:00:00 via RESPIRATORY_TRACT
  Administered 2016-04-17 – 2016-04-19 (×5): 2 mL via RESPIRATORY_TRACT
  Filled 2016-04-17 (×8): qty 4

## 2016-04-17 MED ORDER — IPRATROPIUM-ALBUTEROL 0.5-2.5 (3) MG/3ML IN SOLN
3.0000 mL | Freq: Three times a day (TID) | RESPIRATORY_TRACT | Status: DC
  Administered 2016-04-17 – 2016-04-23 (×17): 3 mL via RESPIRATORY_TRACT
  Filled 2016-04-17 (×19): qty 3

## 2016-04-17 MED ORDER — ENSURE ENLIVE PO LIQD
237.0000 mL | Freq: Two times a day (BID) | ORAL | Status: DC
  Administered 2016-04-17 – 2016-04-21 (×6): 237 mL via ORAL

## 2016-04-17 NOTE — Progress Notes (Signed)
Midnight chest vest treatment held due to patient sleeping.

## 2016-04-17 NOTE — Progress Notes (Addendum)
PROGRESS NOTE                                                                                                                                                                                                             Patient Demographics:    Douglas Kelly, is a 45 y.o. male, DOB - Apr 03, 1971, KJZ:[id number]  Admit date - 04/15/2016   Admitting Physician Roney Jaffe, MD  Outpatient Primary MD for the patient is No primary care provider on file.  LOS - 2   Chief Complaint  Patient presents with  . Cough       Brief Narrative   45 y.o. male with no sig PMHx, on no medications presents with of cough, sweats , wt loss and poor appetite.  CXR showed complete white out of L chest w mediastinal L shift c/w some lung collapse.  CT chest showed Lsided lobar collapse with L shift, air bronchograms.  Asked to see for admission.     Subjective:    Douglas Kelly today has, No headache, No chest pain, No abdominal pain - No Nausea, Report generalized weakness and cough,   Assessment  & Plan :    Principal Problem:   CAP (community acquired pneumonia) Active Problems:   Cough   Fever   Leukocytosis   Loss of weight   Normocytic anemia   LFTs abnormal   Smoker   Collapse of left lung, hx of pneumonia 5 months ago   Pneumonia of left upper lobe due to infectious organism (HCC)   Protein-calorie malnutrition, severe  Sepsis due to Pneumonia with left lung collapse - Patient presents with fever, tachycardia, tachypnea, elevated lactic acid and leukocytosis, sepsis criteria met on admission. - This is secondary to pneumonia with left lung collapse, follow on cultures and sputum cultures. - Continue with IV fluids,  - Continue with broad-spectrum antibiotics including IV vancomycin and Zosyn giving severity of illness. - reports weight loss, night sweats, and significant weight loss, pulmonary consulted for further evaluation, plan for bronchoscopy on  Monday - follow on urine and sputum cultures, so far no growth to date. - Continue chest PT including flutter valve , vest and Mucinex.  Right lung nodules - Pulm  Consulted, plan for bronch on Monday.  Elevated LFTs - Most likely to sepsis, continue to monitor closely closely  Tobacco  abuse - Counseled, requesting a nicotine patch  Anemia - Anemia of chronic illness, B12 level is borderline, will start on supplement  Severe protein calorie malnutrition - we'll start on supplements    Code Status : Full  Family Communication  : wife at bedside  Disposition Plan  : pending further work up.   Consults  :  PCCM  Procedures  : none  DVT Prophylaxis  :  Lovenox   Lab Results  Component Value Date   PLT 610 (H) 04/17/2016    Antibiotics  :   Anti-infectives    Start     Dose/Rate Route Frequency Ordered Stop   04/16/16 0600  vancomycin (VANCOCIN) IVPB 1000 mg/200 mL premix     1,000 mg 200 mL/hr over 60 Minutes Intravenous Every 8 hours 04/16/16 0242     04/15/16 2215  piperacillin-tazobactam (ZOSYN) IVPB 3.375 g     3.375 g 12.5 mL/hr over 240 Minutes Intravenous Every 8 hours 04/15/16 2210     04/15/16 1828  vancomycin (VANCOCIN) 500 MG powder    Comments:  Peel, Adrienne   : cabinet override      04/15/16 1828 04/16/16 0644   04/15/16 1649  vancomycin (VANCOCIN) 500 MG powder    Comments:  Peel, Adrienne   : cabinet override      04/15/16 1649 04/15/16 1830   04/15/16 1649  vancomycin (VANCOCIN) 1-5 GM/200ML-% IVPB    Comments:  Peel, Adrienne   : cabinet override      04/15/16 1649 04/15/16 1830   04/15/16 1645  piperacillin-tazobactam (ZOSYN) IVPB 3.375 g     3.375 g 100 mL/hr over 30 Minutes Intravenous  Once 04/15/16 1638 04/15/16 1723   04/15/16 1645  vancomycin (VANCOCIN) 1,270 mg in sodium chloride 0.9 % 500 mL IVPB  Status:  Discontinued     20 mg/kg  63.5 kg 250 mL/hr over 120 Minutes Intravenous  Once 04/15/16 1638 04/16/16 0241         Objective:   Vitals:   04/16/16 1430 04/16/16 1712 04/16/16 1852 04/17/16 0849  BP: 113/63     Pulse: 88     Resp: 20     Temp: 98 F (36.7 C)  100 F (37.8 C)   TempSrc: Oral  Oral   SpO2: 96% 95%  96%  Weight:      Height:        Wt Readings from Last 3 Encounters:  04/15/16 68.1 kg (150 lb 2.1 oz)  11/03/12 67.1 kg (148 lb)     Intake/Output Summary (Last 24 hours) at 04/17/16 0946 Last data filed at 04/17/16 0600  Gross per 24 hour  Intake             3700 ml  Output                0 ml  Net             3700 ml     Physical Exam  Awake Alert, Oriented X 3, No new F.N deficits, Normal affect Supple Neck,No JVD,  Symmetrical Chest wall movement, decreased  air movement on left, no wheezing. RRR,No Gallops,Rubs or new Murmurs, No Parasternal Heave +ve B.Sounds, Abd Soft, No tenderness, No rebound - guarding or rigidity. No Cyanosis, Clubbing or edema, No new Rash or bruise     Data Review:    CBC  Recent Labs Lab 04/15/16 1616 04/15/16 2252 04/17/16 0530  WBC 23.2* 20.7* 17.9*  HGB 10.2* 9.2*  9.9*  HCT 31.2* 27.8* 30.3*  PLT 585* 541* 610*  MCV 93.1 92.4 93.2  MCH 30.4 30.6 30.5  MCHC 32.7 33.1 32.7  RDW 13.1 13.6 13.5  LYMPHSABS 1.8  --   --   MONOABS 1.4*  --   --   EOSABS 0.0  --   --   BASOSABS 0.1  --   --     Chemistries   Recent Labs Lab 04/15/16 1616 04/15/16 2252 04/16/16 0454 04/17/16 0530  NA 132*  --  137 137  K 4.3  --  3.8 4.1  CL 98*  --  104 103  CO2 24  --  26 27  GLUCOSE 170*  --  156* 124*  BUN 13  --  9 7  CREATININE 0.69 0.74 0.72 0.71  CALCIUM 8.5*  --  8.0* 8.3*  AST 74*  --   --  81*  ALT 139*  --   --  205*  ALKPHOS 124  --   --  147*  BILITOT 0.4  --   --  0.3   ------------------------------------------------------------------------------------------------------------------ No results for input(s): CHOL, HDL, LDLCALC, TRIG, CHOLHDL, LDLDIRECT in the last 72 hours.  No results found for:  HGBA1C ------------------------------------------------------------------------------------------------------------------ No results for input(s): TSH, T4TOTAL, T3FREE, THYROIDAB in the last 72 hours.  Invalid input(s): FREET3 ------------------------------------------------------------------------------------------------------------------  Recent Labs  04/16/16 0454  VITAMINB12 330  FOLATE 14.1  FERRITIN 918*  TIBC 119*  IRON 12*  RETICCTPCT 0.8    Coagulation profile  Recent Labs Lab 04/16/16 1421  INR 1.21    No results for input(s): DDIMER in the last 72 hours.  Cardiac Enzymes  Recent Labs Lab 04/15/16 1616  TROPONINI <0.03   ------------------------------------------------------------------------------------------------------------------ No results found for: BNP  Inpatient Medications  Scheduled Meds: . acetylcysteine  2 mL Nebulization TID  . budesonide (PULMICORT) nebulizer solution  0.5 mg Nebulization BID  . enoxaparin (LOVENOX) injection  40 mg Subcutaneous Q24H  . feeding supplement (ENSURE ENLIVE)  237 mL Oral BID BM  . guaiFENesin  1,200 mg Oral BID  . ipratropium-albuterol  3 mL Nebulization TID  . nicotine  21 mg Transdermal Daily  . piperacillin-tazobactam (ZOSYN)  IV  3.375 g Intravenous Q8H  . protein supplement shake  11 oz Oral BID BM  . vancomycin  1,000 mg Intravenous Q8H   Continuous Infusions: . sodium chloride 1,000 mL (04/17/16 0941)   PRN Meds:.albuterol, oxyCODONE-acetaminophen  Micro Results Recent Results (from the past 240 hour(s))  Blood culture (routine x 2)     Status: None (Preliminary result)   Collection Time: 04/15/16  4:40 PM  Result Value Ref Range Status   Specimen Description BLOOD LEFT HAND  Final   Special Requests BOTTLES DRAWN AEROBIC AND ANAEROBIC 5CC EACH  Final   Culture   Final    NO GROWTH < 24 HOURS Performed at [facility]    Report Status PENDING  Incomplete  Blood culture (routine x  2)     Status: None (Preliminary result)   Collection Time: 04/15/16  4:55 PM  Result Value Ref Range Status   Specimen Description BLOOD RIGHT HAND  Final   Special Requests BOTTLES DRAWN AEROBIC AND ANAEROBIC 5CC EACH  Final   Culture   Final    NO GROWTH < 24 HOURS Performed at [facility]    Report Status PENDING  Incomplete  Urine culture     Status: None   Collection Time: 04/15/16  7:08 PM  Result Value Ref Range Status   Specimen Description URINE, RANDOM  Final   Special Requests NONE  Final   Culture NO GROWTH Performed at [facility]   Final   Report Status 04/17/2016 FINAL  Final  Culture, sputum-assessment     Status: None   Collection Time: 04/15/16 10:01 PM  Result Value Ref Range Status   Specimen Description SPUTUM  Final   Special Requests NONE  Final   Sputum evaluation   Final    MICROSCOPIC FINDINGS SUGGEST THAT THIS SPECIMEN IS NOT REPRESENTATIVE OF LOWER RESPIRATORY SECRETIONS. PLEASE RECOLLECT. NOTIFIED RN LISA PATRAM @[id number]  ZANDO,C    Report Status 04/16/2016 FINAL  Final  Culture, expectorated sputum-assessment     Status: None   Collection Time: 04/17/16  5:58 AM  Result Value Ref Range Status   Specimen Description SPUTUM  Final   Special Requests NONE  Final   Sputum evaluation   Final    THIS SPECIMEN IS ACCEPTABLE. RESPIRATORY CULTURE REPORT TO FOLLOW.   Report Status 04/17/2016 FINAL  Final    Radiology Reports Dg Chest 2 View  Result Date: 04/15/2016 CLINICAL DATA:  Left-sided chest pain and dyspnea x5 months. History of pneumonia 7 months ago that filled entire left lung. EXAM: CHEST  2 VIEW COMPARISON:  11/03/2012 FINDINGS: There is volume loss with tracheal deviation to the left and near complete whiteout of the left hemithorax. Compensatory hyperinflation of the right lung. Only a few aerated portions of left upper lobe are noted near the apex. The entire heart is obscured as is the aorta. No suspicious osseous abnormality.  IMPRESSION: Near complete whiteout of the left lung with volume loss and mediastinal shift to the left. Compensatory hyperinflation of the normal-appearing right lung. Electronically Signed   By: Ashley Royalty M.D.   On: 04/15/2016 16:19   Ct Angio Chest Pe W And/or Wo Contrast  Result Date: 04/15/2016 CLINICAL DATA:  Cough for 5 months with dyspnea and weight loss. Left-sided chest pain. Current smoker. EXAM: CT ANGIOGRAPHY CHEST WITH CONTRAST TECHNIQUE: Multidetector CT imaging of the chest was performed using the standard protocol during bolus administration of intravenous contrast. Multiplanar CT image reconstructions and MIPs were obtained to evaluate the vascular anatomy. CONTRAST:  100 cc of Isovue 370 COMPARISON:  Same day CXR FINDINGS: Cardiovascular: The heart shifted toward the left hemithorax secondary to left lung collapse and volume loss. No acute pulmonary embolus. No aortic aneurysm or dissection. No pericardial effusion. Mediastinum/Nodes: There is prevascular lymphadenopathy measuring up to 12 mm short axis. No paratracheal, supraclavicular nor axillary lymphadenopathy. Lungs/Pleura: There is volume loss secondary to atelectasis and left lung collapse of the left upper lobe, lingula and left lower lobe. Tubular branching air like opacities are seen within the left lower consistent with air within bronchi and bronchioles. No confluent areas of cavitation noted to suggest pulmonary necrosis on current exam. Soft tissue densities are noted abruptly terminating the distal left main stem bronchus. An endoluminal mass or mucous causing the left lung collapse is not excluded. Faint ground-glass 7 mm in average and 5 mm in average nodular densities in the right upper lobe are noted, series 4 image 48 and 49. No effusion or pneumothorax on the right. Upper Abdomen: No acute abnormality within the abdomen. No enhancing hepatic mass. No definite adrenal nodule to the extent that they are included.  Musculoskeletal: Nonacute Review of the MIP images confirms the above findings. IMPRESSION: 1. Left lung atelectasis/collapse with volume loss and shift  of the mediastinum to the left. Soft tissue densities in the distal mainstem bronchus may be secondary to is inspissation. Endobronchial mass or lesion is not entirely excluded. No definite evidence of pulmonary necrosis. Mild prevascular lymphadenopathy. 2. No acute pulmonary embolus. 3. 7 mm and 5 mm right upper lobe ground-glass and nodular opacities. Initial follow-up with CT at 6-12 months is recommended to confirm persistence. If persistent, repeat CT is recommended every 2 years until 5 years of stability has been established. This recommendation follows the consensus statement: Guidelines for Management of Incidental Pulmonary Nodules Detected on CT Images: From the Fleischner Society 2017; Radiology 2017; 284:228-243. Electronically Signed   By: Ashley Royalty M.D.   On: 04/15/2016 18:17     Joshalyn Ancheta M.D on 04/17/2016 at 9:46 AM  Between 7am to 7pm - Pager - [phone]  After 7pm go to [email or web address] - password [email or web address]  [facility] -  Office  [phone]

## 2016-04-18 ENCOUNTER — Inpatient Hospital Stay (HOSPITAL_COMMUNITY): Payer: Medicaid Other

## 2016-04-18 LAB — COMPREHENSIVE METABOLIC PANEL
ALBUMIN: 2.2 g/dL — AB (ref 3.5–5.0)
ALK PHOS: 139 U/L — AB (ref 38–126)
ALT: 204 U/L — ABNORMAL HIGH (ref 17–63)
AST: 69 U/L — AB (ref 15–41)
Anion gap: 8 (ref 5–15)
BILIRUBIN TOTAL: 0.4 mg/dL (ref 0.3–1.2)
BUN: 8 mg/dL (ref 6–20)
CALCIUM: 8.5 mg/dL — AB (ref 8.9–10.3)
CO2: 26 mmol/L (ref 22–32)
CREATININE: 0.63 mg/dL (ref 0.61–1.24)
Chloride: 102 mmol/L (ref 101–111)
GFR calc Af Amer: >60 mL/min (ref >60)
GLUCOSE: 126 mg/dL — AB (ref 65–99)
POTASSIUM: 4.2 mmol/L (ref 3.5–5.1)
Sodium: 136 mmol/L (ref 135–145)
TOTAL PROTEIN: 6.1 g/dL — AB (ref 6.5–8.1)

## 2016-04-18 LAB — VANCOMYCIN, TROUGH: VANCOMYCIN TR: 14 ug/mL — AB (ref 15–20)

## 2016-04-18 MED ORDER — CYANOCOBALAMIN 1000 MCG/ML IJ SOLN
1000.0000 ug | Freq: Every day | INTRAMUSCULAR | Status: AC
  Administered 2016-04-20: 1000 ug via INTRAMUSCULAR
  Filled 2016-04-18 (×3): qty 1

## 2016-04-18 MED ORDER — SODIUM CHLORIDE 0.9 % IV SOLN
1000.0000 mL | INTRAVENOUS | Status: DC
  Administered 2016-04-18 – 2016-04-19 (×3): 1000 mL via INTRAVENOUS

## 2016-04-18 NOTE — Progress Notes (Signed)
Pharmacy Antibiotic Note  Douglas Kelly is a 45 y.o. male admitted on 04/15/2016 with pneumonia and L lung collapse. Pharmacy has been consulted for Vancomycin and Zosyn dosing.  Today, 04/18/2016:  BCx remain negative, early growth in resp Cx (after abx) but not suggestive of Staphylococcus  Satting > 95% on RA  Afebrile since 12/8  WBC elevated but trending down  Repeat CXR 12/10 with persistent near-complete collapse of L lung, R lung normal  Vanc trough slightly subtherapeutic on 1g q8 hr  Plan:  Continue vancomycin 1g IV q8 hr as no signs of MRSA, appearing overall improved, and I suspect vancomycin can be stopped in the next 48 hr as WBC normalize  Continue Zosyn by extended infusion as ordered  Consider MRSA PCR screen as this can help to r/o MRSA pneumonia with NPV > 98%   Height: '5\' 8"'$  (172.7 cm) Weight: 150 lb 2.1 oz (68.1 kg) IBW/kg (Calculated) : 68.4  Temp (24hrs), Avg:98 F (36.7 C), Min:97.6 F (36.4 C), Max:98.3 F (36.8 C)   Recent Labs Lab 04/15/16 1616 04/15/16 1659 04/15/16 2252 04/16/16 0454 04/17/16 0530 04/18/16 0525 04/18/16 1314  WBC 23.2*  --  20.7*  --  17.9*  --   --   CREATININE 0.69  --  0.74 0.72 0.71 0.63  --   LATICACIDVEN  --  2.13*  --   --   --   --   --   VANCOTROUGH  --   --   --   --   --   --  14*    Estimated Creatinine Clearance: 110 mL/min (by C-G formula based on SCr of 0.63 mg/dL).    No Known Allergies  Antimicrobials this admission:  Vancomycin 04/15/2016 >> Zosyn 04/15/2016 >>   Dose adjustments this admission:  ---  Microbiology results:  12/7 BCx: ng3d 12/7 BCx repeat: ng2d 12/7 UCx: NGF  12/7 Sputum: poor sample 12/9 Sputum: Rare GPC and GNC in pairs S. pneumo UAg: neg HIV: neg  Thank you for allowing pharmacy to be a part of this patient's care.  Reuel Boom, PharmD, BCPS Pager: [phone] 04/18/2016, 3:17 PM

## 2016-04-18 NOTE — Progress Notes (Signed)
PROGRESS NOTE                                                                                                                                                                                                             Patient Demographics:    Douglas Kelly, is a 45 y.o. male, DOB - 07-06-1970, MGQ:[id number]  Admit date - 04/15/2016   Admitting Physician Roney Jaffe, MD  Outpatient Primary MD for the patient is No primary care provider on file.  LOS - 3   Chief Complaint  Patient presents with  . Cough       Brief Narrative   45 y.o. male with no sig PMHx, on no medications presents with of cough, sweats , wt loss and poor appetite.  CXR showed complete white out of L chest w mediastinal L shift c/w some lung collapse.  CT chest showed Lsided lobar collapse with L shift, air bronchograms. Admitted for HCAP, PCCM Consulted and planned for bronchoscopy on 12/11     Subjective:    [name] today has, No headache, No chest pain, No abdominal pain - No Nausea, Report generalized weakness and cough,   Assessment  & Plan :    Principal Problem:   CAP (community acquired pneumonia) Active Problems:   Cough   Fever   Leukocytosis   Loss of weight   Normocytic anemia   LFTs abnormal   Smoker   Collapse of left lung, hx of pneumonia 5 months ago   Pneumonia of left upper lobe due to infectious organism ([place])   Protein-calorie malnutrition, severe  Sepsis due to Pneumonia with left lung collapse - Patient presents with fever, tachycardia, tachypnea, elevated lactic acid and leukocytosis, sepsis criteria met on admission. - This is secondary to pneumonia with left lung collapse, follow on cultures and sputum cultures. - Continue with IV fluids,  - Continue with broad-spectrum antibiotics including IV vancomycin and Zosyn giving severity of illness. - reports weight loss, night sweats, and significant weight loss, pulmonary consulted for further  evaluation, plan for bronchoscopy on Monday - follow on urine and sputum cultures, so far no growth to date. - Continue chest PT including flutter valve , vest and Mucinex.  Right lung nodules - Pulm  Consulted, plan for bronch on Monday.  Elevated LFTs - Most likely to sepsis, remained stable,  will check hepatitis panel, will check right upper quadrant ultrasound  Tobacco abuse - Counseled, requesting a nicotine patch  Anemia - Anemia of chronic illness, B12 level is borderline, active, I am supplement for next 3 days, then was transitioned to oral  Severe protein calorie malnutrition - started on supplements    Code Status : Full  Family Communication  : wife at bedside  Disposition Plan  : pending further work up.   Consults  :  PCCM  Procedures  : none  DVT Prophylaxis  :  Lovenox   Lab Results  Component Value Date   PLT 610 (H) 04/17/2016    Antibiotics  :   Anti-infectives    Start     Dose/Rate Route Frequency Ordered Stop   04/16/16 0600  vancomycin (VANCOCIN) IVPB 1000 mg/200 mL premix     1,000 mg 200 mL/hr over 60 Minutes Intravenous Every 8 hours 04/16/16 0242     04/15/16 2215  piperacillin-tazobactam (ZOSYN) IVPB 3.375 g     3.375 g 12.5 mL/hr over 240 Minutes Intravenous Every 8 hours 04/15/16 2210     04/15/16 1828  vancomycin (VANCOCIN) 500 MG powder    Comments:  Peel, Adrienne   : cabinet override      04/15/16 1828 04/16/16 0644   04/15/16 1649  vancomycin (VANCOCIN) 500 MG powder    Comments:  Peel, Adrienne   : cabinet override      04/15/16 1649 04/15/16 1830   04/15/16 1649  vancomycin (VANCOCIN) 1-5 GM/200ML-% IVPB    Comments:  Peel, Adrienne   : cabinet override      04/15/16 1649 04/15/16 1830   04/15/16 1645  piperacillin-tazobactam (ZOSYN) IVPB 3.375 g     3.375 g 100 mL/hr over 30 Minutes Intravenous  Once 04/15/16 1638 04/15/16 1723   04/15/16 1645  vancomycin (VANCOCIN) 1,270 mg in sodium chloride 0.9 % 500 mL IVPB  Status:   Discontinued     20 mg/kg  63.5 kg 250 mL/hr over 120 Minutes Intravenous  Once 04/15/16 1638 04/16/16 0241        Objective:   Vitals:   04/17/16 1459 04/17/16 2052 04/17/16 2147 04/18/16 0514  BP:   121/70 115/70  Pulse: 88  87 72  Resp: _0 Temp:   98.3 F (36.8 C) 98.2 F (36.8 C)  TempSrc:   Oral Oral  SpO2: 97% 94% 96% 98%  Weight:      Height:        Wt Readings from Last 3 Encounters:  04/15/16 68.1 kg (150 lb 2.1 oz)  11/03/12 67.1 kg (148 lb)     Intake/Output Summary (Last 24 hours) at 04/18/16 1106 Last data filed at 04/18/16 0200  Gross per 24 hour  Intake             3000 ml  Output                0 ml  Net             3000 ml     Physical Exam  Awake Alert, Oriented X 3, No new F.N deficits, Normal affect Supple Neck,No JVD,  Symmetrical Chest wall movement, decreased  air movement on left, no wheezing. RRR,No Gallops,Rubs or new Murmurs, No Parasternal Heave +ve B.Sounds, Abd Soft, No tenderness, No rebound - guarding or rigidity. No Cyanosis, Clubbing or edema, No new Rash or bruise     Data Review:  CBC  Recent Labs Lab 04/15/16 1616 04/15/16 2252 04/17/16 0530  WBC 23.2* 20.7* 17.9*  HGB 10.2* 9.2* 9.9*  HCT 31.2* 27.8* 30.3*  PLT 585* 541* 610*  MCV 93.1 92.4 93.2  MCH 30.4 30.6 30.5  MCHC 32.7 33.1 32.7  RDW 13.1 13.6 13.5  LYMPHSABS 1.8  --   --   MONOABS 1.4*  --   --   EOSABS 0.0  --   --   BASOSABS 0.1  --   --     Chemistries   Recent Labs Lab 04/15/16 1616 04/15/16 2252 04/16/16 0454 04/17/16 0530 04/18/16 0525  NA 132*  --  137 137 136  K 4.3  --  3.8 4.1 4.2  CL 98*  --  104 103 102  CO2 24  --  _0 GLUCOSE 170*  --  156* 124* 126*  BUN 13  --  _1 CREATININE 0.69 0.74 0.72 0.71 0.63  CALCIUM 8.5*  --  8.0* 8.3* 8.5*  AST 74*  --   --  81* 69*  ALT 139*  --   --  205* 204*  ALKPHOS 124  --   --  147* 139*  BILITOT 0.4  --   --  0.3 0.4    ------------------------------------------------------------------------------------------------------------------ No results for input(s): CHOL, HDL, LDLCALC, TRIG, CHOLHDL, LDLDIRECT in the last 72 hours.  No results found for: HGBA1C ------------------------------------------------------------------------------------------------------------------ No results for input(s): TSH, T4TOTAL, T3FREE, THYROIDAB in the last 72 hours.  Invalid input(s): FREET3 ------------------------------------------------------------------------------------------------------------------  Recent Labs  04/16/16 0454  VITAMINB12 330  FOLATE 14.1  FERRITIN 918*  TIBC 119*  IRON 12*  RETICCTPCT 0.8    Coagulation profile  Recent Labs Lab 04/16/16 1421  INR 1.21    No results for input(s): DDIMER in the last 72 hours.  Cardiac Enzymes  Recent Labs Lab 04/15/16 1616  TROPONINI <0.03   ------------------------------------------------------------------------------------------------------------------ No results found for: BNP  Inpatient Medications  Scheduled Meds: . acetylcysteine  2 mL Nebulization TID  . budesonide (PULMICORT) nebulizer solution  0.5 mg Nebulization BID  . enoxaparin (LOVENOX) injection  40 mg Subcutaneous Q24H  . feeding supplement (ENSURE ENLIVE)  237 mL Oral BID BM  . guaiFENesin  1,200 mg Oral BID  . ipratropium-albuterol  3 mL Nebulization TID  . nicotine  21 mg Transdermal Daily  . piperacillin-tazobactam (ZOSYN)  IV  3.375 g Intravenous Q8H  . protein supplement shake  11 oz Oral BID BM  . vancomycin  1,000 mg Intravenous Q8H   Continuous Infusions: . sodium chloride 1,000 mL (04/18/16 0238)   PRN Meds:.albuterol, ibuprofen, oxyCODONE-acetaminophen  Micro Results Recent Results (from the past 240 hour(s))  Blood culture (routine x 2)     Status: None (Preliminary result)   Collection Time: 04/15/16  4:40 PM  Result Value Ref Range Status   Specimen  Description BLOOD LEFT HAND  Final   Special Requests BOTTLES DRAWN AEROBIC AND ANAEROBIC 5CC EACH  Final   Culture   Final    NO GROWTH 2 DAYS Performed at [facility]    Report Status PENDING  Incomplete  Blood culture (routine x 2)     Status: None (Preliminary result)   Collection Time: 04/15/16  4:55 PM  Result Value Ref Range Status   Specimen Description BLOOD RIGHT HAND  Final   Special Requests BOTTLES DRAWN AEROBIC AND ANAEROBIC 5CC EACH  Final   Culture   Final    NO GROWTH  2 DAYS Performed at [facility]    Report Status PENDING  Incomplete  Urine culture     Status: None   Collection Time: 04/15/16  7:08 PM  Result Value Ref Range Status   Specimen Description URINE, RANDOM  Final   Special Requests NONE  Final   Culture NO GROWTH Performed at [facility]   Final   Report Status 04/17/2016 FINAL  Final  Culture, sputum-assessment     Status: None   Collection Time: 04/15/16 10:01 PM  Result Value Ref Range Status   Specimen Description SPUTUM  Final   Special Requests NONE  Final   Sputum evaluation   Final    MICROSCOPIC FINDINGS SUGGEST THAT THIS SPECIMEN IS NOT REPRESENTATIVE OF LOWER RESPIRATORY SECRETIONS. PLEASE RECOLLECT. NOTIFIED RN LISA PATRAM _0  ZANDO,C    Report Status 04/16/2016 FINAL  Final  Culture, blood (routine x 2) Call MD if unable to obtain prior to antibiotics being given     Status: None (Preliminary result)   Collection Time: 04/15/16 10:52 PM  Result Value Ref Range Status   Specimen Description BLOOD RIGHT ARM  Final   Special Requests BOTTLES DRAWN AEROBIC AND ANAEROBIC 6 CC  Final   Culture   Final    NO GROWTH 1 DAY Performed at [facility]    Report Status PENDING  Incomplete  Culture, blood (routine x 2) Call MD if unable to obtain prior to antibiotics being given     Status: None (Preliminary result)   Collection Time: 04/15/16 10:52 PM  Result Value Ref Range Status   Specimen Description  BLOOD RIGHT HAND  Final   Special Requests BOTTLES DRAWN AEROBIC AND ANAEROBIC 6 CC  Final   Culture   Final    NO GROWTH 1 DAY Performed at [facility]    Report Status PENDING  Incomplete  Culture, expectorated sputum-assessment     Status: None   Collection Time: 04/17/16  5:58 AM  Result Value Ref Range Status   Specimen Description SPUTUM  Final   Special Requests NONE  Final   Sputum evaluation   Final    THIS SPECIMEN IS ACCEPTABLE. RESPIRATORY CULTURE REPORT TO FOLLOW.   Report Status 04/17/2016 FINAL  Final  Culture, respiratory (NON-Expectorated)     Status: None (Preliminary result)   Collection Time: 04/17/16  5:58 AM  Result Value Ref Range Status   Specimen Description SPUTUM  Final   Special Requests NONE  Final   Gram Stain   Final    ABUNDANT WBC PRESENT, PREDOMINANTLY PMN FEW SQUAMOUS EPITHELIAL CELLS PRESENT RARE GRAM POSITIVE COCCI IN PAIRS RARE GRAM NEGATIVE COCCI IN PAIRS    Culture   Final    TOO YOUNG TO READ Performed at [facility]    Report Status PENDING  Incomplete    Radiology Reports Dg Chest 2 View  Result Date: 04/18/2016 CLINICAL DATA:  April 15, 2016 EXAM: CHEST  2 VIEW COMPARISON:  None. FINDINGS: Near complete collapse of left lung with minimal aeration in the left apex, similar in the interval. Abrupt cut off of the left mainstem bronchus, as appreciated on recent CT. The heart mediastinum are shifted into the left chest. Hyperexpansion of the right lung is compensatory. The right lung is otherwise clear. No other interval changes. IMPRESSION: Near complete collapse left lung with abrupt cut off of left mainstem bronchus, similar in the interval. Electronically Signed   By: Dorise Bullion III M.D  On: 04/18/2016 09:05   Dg Chest 2 View  Result Date: 04/15/2016 CLINICAL DATA:  Left-sided chest pain and dyspnea x5 months. History of pneumonia 7 months ago that filled entire left lung. EXAM: CHEST  2 VIEW COMPARISON:   11/03/2012 FINDINGS: There is volume loss with tracheal deviation to the left and near complete whiteout of the left hemithorax. Compensatory hyperinflation of the right lung. Only a few aerated portions of left upper lobe are noted near the apex. The entire heart is obscured as is the aorta. No suspicious osseous abnormality. IMPRESSION: Near complete whiteout of the left lung with volume loss and mediastinal shift to the left. Compensatory hyperinflation of the normal-appearing right lung. Electronically Signed   By: Ashley Royalty M.D.   On: 04/15/2016 16:19   Ct Angio Chest Pe W And/or Wo Contrast  Result Date: 04/15/2016 CLINICAL DATA:  Cough for 5 months with dyspnea and weight loss. Left-sided chest pain. Current smoker. EXAM: CT ANGIOGRAPHY CHEST WITH CONTRAST TECHNIQUE: Multidetector CT imaging of the chest was performed using the standard protocol during bolus administration of intravenous contrast. Multiplanar CT image reconstructions and MIPs were obtained to evaluate the vascular anatomy. CONTRAST:  100 cc of Isovue 370 COMPARISON:  Same day CXR FINDINGS: Cardiovascular: The heart shifted toward the left hemithorax secondary to left lung collapse and volume loss. No acute pulmonary embolus. No aortic aneurysm or dissection. No pericardial effusion. Mediastinum/Nodes: There is prevascular lymphadenopathy measuring up to 12 mm short axis. No paratracheal, supraclavicular nor axillary lymphadenopathy. Lungs/Pleura: There is volume loss secondary to atelectasis and left lung collapse of the left upper lobe, lingula and left lower lobe. Tubular branching air like opacities are seen within the left lower consistent with air within bronchi and bronchioles. No confluent areas of cavitation noted to suggest pulmonary necrosis on current exam. Soft tissue densities are noted abruptly terminating the distal left main stem bronchus. An endoluminal mass or mucous causing the left lung collapse is not excluded.  Faint ground-glass 7 mm in average and 5 mm in average nodular densities in the right upper lobe are noted, series 4 image 48 and 49. No effusion or pneumothorax on the right. Upper Abdomen: No acute abnormality within the abdomen. No enhancing hepatic mass. No definite adrenal nodule to the extent that they are included. Musculoskeletal: Nonacute Review of the MIP images confirms the above findings. IMPRESSION: 1. Left lung atelectasis/collapse with volume loss and shift of the mediastinum to the left. Soft tissue densities in the distal mainstem bronchus may be secondary to is inspissation. Endobronchial mass or lesion is not entirely excluded. No definite evidence of pulmonary necrosis. Mild prevascular lymphadenopathy. 2. No acute pulmonary embolus. 3. 7 mm and 5 mm right upper lobe ground-glass and nodular opacities. Initial follow-up with CT at 6-12 months is recommended to confirm persistence. If persistent, repeat CT is recommended every 2 years until 5 years of stability has been established. This recommendation follows the consensus statement: Guidelines for Management of Incidental Pulmonary Nodules Detected on CT Images: From the Fleischner Society 2017; Radiology 2017; 284:228-243. Electronically Signed   By: Ashley Royalty M.D.   On: 04/15/2016 18:17     ,  M.D on 04/18/2016 at 11:06 AM  Between 7am to 7pm - Pager - [phone]  After 7pm go to [email or web address] - password [email or web address]  [facility] -  Office  [phone]

## 2016-04-19 ENCOUNTER — Encounter (HOSPITAL_COMMUNITY)
Admission: EM | Disposition: A | Discharge status: Home / Self Care | Payer: Self-pay | Source: Home / Self Care | Attending: Internal Medicine

## 2016-04-19 ENCOUNTER — Inpatient Hospital Stay (HOSPITAL_COMMUNITY): Payer: Medicaid Other

## 2016-04-19 DIAGNOSIS — J189 Pneumonia, unspecified organism: Secondary | ICD-10-CM

## 2016-04-19 HISTORY — PX: VIDEO BRONCHOSCOPY: SHX5072

## 2016-04-19 LAB — CULTURE, RESPIRATORY

## 2016-04-19 LAB — BASIC METABOLIC PANEL
ANION GAP: 8 (ref 5–15)
BUN: 10 mg/dL (ref 6–20)
CALCIUM: 8.6 mg/dL — AB (ref 8.9–10.3)
CO2: 28 mmol/L (ref 22–32)
CREATININE: 0.72 mg/dL (ref 0.61–1.24)
Chloride: 101 mmol/L (ref 101–111)
Glucose, Bld: 114 mg/dL — ABNORMAL HIGH (ref 65–99)
Potassium: 4.8 mmol/L (ref 3.5–5.1)
Sodium: 137 mmol/L (ref 135–145)

## 2016-04-19 LAB — CBC
HCT: 31.7 % — ABNORMAL LOW (ref 39.0–52.0)
Hemoglobin: 10.1 g/dL — ABNORMAL LOW (ref 13.0–17.0)
MCH: 29.8 pg (ref 26.0–34.0)
MCHC: 31.9 g/dL (ref 30.0–36.0)
MCV: 93.5 fL (ref 78.0–100.0)
PLATELETS: 677 10*3/uL — AB (ref 150–400)
RBC: 3.39 MIL/uL — ABNORMAL LOW (ref 4.22–5.81)
RDW: 13.8 % (ref 11.5–15.5)
WBC: 12.5 10*3/uL — AB (ref 4.0–10.5)

## 2016-04-19 LAB — CULTURE, RESPIRATORY W GRAM STAIN: Culture: NORMAL

## 2016-04-19 LAB — HEPATITIS PANEL, ACUTE
HEP A IGM: NEGATIVE
HEP B C IGM: NEGATIVE
HEP B S AG: NEGATIVE

## 2016-04-19 SURGERY — VIDEO BRONCHOSCOPY WITHOUT FLUORO
Anesthesia: Moderate Sedation | Laterality: Bilateral

## 2016-04-19 MED ORDER — FENTANYL CITRATE (PF) 100 MCG/2ML IJ SOLN
INTRAMUSCULAR | Status: DC | PRN
  Administered 2016-04-19 (×4): 25 ug via INTRAVENOUS
  Administered 2016-04-19: 50 ug via INTRAVENOUS

## 2016-04-19 MED ORDER — PHENYLEPHRINE HCL 0.25 % NA SOLN
NASAL | Status: DC | PRN
  Administered 2016-04-19: 2 via NASAL

## 2016-04-19 MED ORDER — LIDOCAINE HCL 2 % EX GEL
CUTANEOUS | Status: DC | PRN
  Administered 2016-04-19: 1

## 2016-04-19 MED ORDER — OXYCODONE-ACETAMINOPHEN 5-325 MG PO TABS
1.0000 | ORAL_TABLET | Freq: Four times a day (QID) | ORAL | Status: DC | PRN
  Administered 2016-04-19 – 2016-04-21 (×8): 2 via ORAL
  Administered 2016-04-22: 1 via ORAL
  Administered 2016-04-23 (×2): 2 via ORAL
  Filled 2016-04-19: qty 1
  Filled 2016-04-19 (×6): qty 2
  Filled 2016-04-19: qty 1
  Filled 2016-04-19 (×4): qty 2

## 2016-04-19 MED ORDER — LIDOCAINE HCL 1 % IJ SOLN
INTRAMUSCULAR | Status: DC | PRN
  Administered 2016-04-19: 6 mL via RESPIRATORY_TRACT

## 2016-04-19 MED ORDER — FENTANYL CITRATE (PF) 100 MCG/2ML IJ SOLN
INTRAMUSCULAR | Status: AC
  Filled 2016-04-19: qty 4

## 2016-04-19 MED ORDER — IOPAMIDOL (ISOVUE-300) INJECTION 61%
100.0000 mL | Freq: Once | INTRAVENOUS | Status: AC | PRN
  Administered 2016-04-19: 100 mL via INTRAVENOUS

## 2016-04-19 MED ORDER — MORPHINE SULFATE (PF) 2 MG/ML IV SOLN
2.0000 mg | INTRAVENOUS | Status: DC | PRN
  Administered 2016-04-19: 4 mg via INTRAVENOUS
  Administered 2016-04-20 (×4): 2 mg via INTRAVENOUS
  Administered 2016-04-21 – 2016-04-22 (×6): 4 mg via INTRAVENOUS
  Administered 2016-04-22 – 2016-04-23 (×3): 2 mg via INTRAVENOUS
  Filled 2016-04-19: qty 2
  Filled 2016-04-19 (×2): qty 1
  Filled 2016-04-19: qty 2
  Filled 2016-04-19: qty 1
  Filled 2016-04-19 (×2): qty 2
  Filled 2016-04-19: qty 1
  Filled 2016-04-19 (×3): qty 2
  Filled 2016-04-19: qty 1
  Filled 2016-04-19: qty 2
  Filled 2016-04-19: qty 1

## 2016-04-19 MED ORDER — MIDAZOLAM HCL 10 MG/2ML IJ SOLN
INTRAMUSCULAR | Status: DC | PRN
Start: 2016-04-19 — End: 2016-04-19
  Administered 2016-04-19: 1 mg via INTRAVENOUS
  Administered 2016-04-19: 2 mg via INTRAVENOUS
  Administered 2016-04-19 (×3): 1 mg via INTRAVENOUS

## 2016-04-19 MED ORDER — LIDOCAINE HCL 2 % EX GEL: 1.0000 "application " | Freq: Once | CUTANEOUS | Status: DC

## 2016-04-19 MED ORDER — SODIUM CHLORIDE 0.9 % IJ SOLN
INTRAMUSCULAR | Status: AC
  Filled 2016-04-19: qty 50

## 2016-04-19 MED ORDER — SODIUM CHLORIDE 0.9 % IV SOLN
INTRAVENOUS | Status: DC
  Administered 2016-04-19: 09:00:00 via INTRAVENOUS

## 2016-04-19 MED ORDER — IOPAMIDOL (ISOVUE-300) INJECTION 61%: 30.0000 mL | Freq: Once | INTRAVENOUS | Status: DC | PRN

## 2016-04-19 MED ORDER — IOPAMIDOL (ISOVUE-300) INJECTION 61%
INTRAVENOUS | Status: AC
  Filled 2016-04-19: qty 100

## 2016-04-19 MED ORDER — BUTAMBEN-TETRACAINE-BENZOCAINE 2-2-14 % EX AERO: 1.0000 | INHALATION_SPRAY | Freq: Once | CUTANEOUS | Status: DC

## 2016-04-19 MED ORDER — IOPAMIDOL (ISOVUE-300) INJECTION 61%
INTRAVENOUS | Status: AC
  Filled 2016-04-19: qty 30

## 2016-04-19 MED ORDER — MIDAZOLAM HCL 5 MG/ML IJ SOLN
INTRAMUSCULAR | Status: AC
  Filled 2016-04-19: qty 2

## 2016-04-19 MED ORDER — PHENYLEPHRINE HCL 0.25 % NA SOLN: 1.0000 | Freq: Four times a day (QID) | NASAL | Status: DC | PRN

## 2016-04-19 NOTE — Progress Notes (Signed)
PROGRESS NOTE                                                                                                                                                                                                             Patient Demographics:    Douglas Kelly, is a 45 y.o. male, DOB - 13-Jul-1970, RWE:[id number]  Admit date - 04/15/2016   Admitting Physician Roney Jaffe, MD  Outpatient Primary MD for the patient is No primary care provider on file.  LOS - 4   Chief Complaint  Patient presents with  . Cough       Brief Narrative   45 y.o. male with no sig PMHx, on no medications presents with of cough, sweats , wt loss and poor appetite.  CXR showed complete white out of L chest w mediastinal L shift c/w some lung collapse.  CT chest showed Lsided lobar collapse with L shift, air bronchograms. Admitted for HCAP, PCCM Consulted and planned for bronchoscopy on 12/11     Subjective:    [name] today has, No headache, No chest pain, No abdominal pain - No Nausea, Report generalized weakness and cough,   Assessment  & Plan :    Principal Problem:   CAP (community acquired pneumonia) Active Problems:   Cough   Fever   Leukocytosis   Loss of weight   Normocytic anemia   LFTs abnormal   Smoker   Collapse of left lung, hx of pneumonia 5 months ago   Pneumonia of left upper lobe due to infectious organism ([place])   Protein-calorie malnutrition, severe  Sepsis due to Postobstructive Pneumonia with left lung mass - Patient presents with fever, tachycardia, tachypnea, elevated lactic acid and leukocytosis, sepsis criteria met on admission. - This is secondary to pneumonia with left lung collapse, follow on cultures and sputum cultures. - Continue with broad-spectrum antibiotics including IV vancomycin and Zosyn giving severity of illness. Will discontinue IV vancomycin 12/11 - reports weight loss, night sweats, and significant weight loss, pulmonary  consulted for further evaluation, high probability for malignancy, awaiting brush biopsy results, meanwhile will obtain MRI brain and CT abdomen and pelvis to rule out metastasis - Status post bronchoscopy 12/11, with evidence of mass in the left bronchus. - Continue chest PT including flutter valve , vest and Mucinex.  Right lung nodules - Pulm  Consulted  Elevated LFTs - Most likely to sepsis, remained stable, negative hepatitis panel, no acute finding in right upper quadrant ultrasound .   Tobacco abuse - Counseled, requesting a nicotine patch  Anemia - Anemia of chronic illness, B12 level is borderline, active, will supplement for t 3 days, then was transitioned to oral  Severe protein calorie malnutrition - started on supplements    Code Status : Full  Family Communication  : wife at bedside  Disposition Plan  : pending further work up.   Consults  :  PCCM  Procedures  :  bronchoscopy 12/11 by Dr. Curt Jews  DVT Prophylaxis  :  Lovenox   Lab Results  Component Value Date   PLT 677 (H) 04/19/2016    Antibiotics  :   Anti-infectives    Start     Dose/Rate Route Frequency Ordered Stop   04/16/16 0600  vancomycin (VANCOCIN) IVPB 1000 mg/200 mL premix  Status:  Discontinued     1,000 mg 200 mL/hr over 60 Minutes Intravenous Every 8 hours 04/16/16 0242 04/19/16 1513   04/15/16 2215  piperacillin-tazobactam (ZOSYN) IVPB 3.375 g     3.375 g 12.5 mL/hr over 240 Minutes Intravenous Every 8 hours 04/15/16 2210     04/15/16 1828  vancomycin (VANCOCIN) 500 MG powder    Comments:  Peel, Adrienne   : cabinet override      04/15/16 1828 04/16/16 0644   04/15/16 1649  vancomycin (VANCOCIN) 500 MG powder    Comments:  Peel, Adrienne   : cabinet override      04/15/16 1649 04/15/16 1830   04/15/16 1649  vancomycin (VANCOCIN) 1-5 GM/200ML-% IVPB    Comments:  Peel, Adrienne   : cabinet override      04/15/16 1649 04/15/16 1830   04/15/16 1645  piperacillin-tazobactam (ZOSYN)  IVPB 3.375 g     3.375 g 100 mL/hr over 30 Minutes Intravenous  Once 04/15/16 1638 04/15/16 1723   04/15/16 1645  vancomycin (VANCOCIN) 1,270 mg in sodium chloride 0.9 % 500 mL IVPB  Status:  Discontinued     20 mg/kg  63.5 kg 250 mL/hr over 120 Minutes Intravenous  Once 04/15/16 1638 04/16/16 0241        Objective:   Vitals:   04/19/16 1010 04/19/16 1015 04/19/16 1020 04/19/16 1447  BP: 118/61 (!) 91/55 105/66 113/62  Pulse:    93  Resp: 16 16 (!) 24 18  Temp:    97.7 F (36.5 C)  TempSrc:    Oral  SpO2: 94% 94% 92% 96%  Weight:      Height:        Wt Readings from Last 3 Encounters:  04/15/16 68.1 kg (150 lb 2.1 oz)  11/03/12 67.1 kg (148 lb)     Intake/Output Summary (Last 24 hours) at 04/19/16 1514 Last data filed at 04/19/16 1013  Gross per 24 hour  Intake             2550 ml  Output                0 ml  Net             2550 ml     Physical Exam  Awake Alert, Oriented X 3, No new F.N deficits, Normal affect Supple Neck,No JVD,  Symmetrical Chest wall movement, decreased  air movement on left, no wheezing. RRR,No Gallops,Rubs or new Murmurs, No Parasternal Heave +ve B.Sounds, Abd Soft, No tenderness, No rebound - guarding  or rigidity. No Cyanosis, Clubbing or edema, No new Rash or bruise     Data Review:    CBC  Recent Labs Lab 04/15/16 1616 04/15/16 2252 04/17/16 0530 04/19/16 0520  WBC 23.2* 20.7* 17.9* 12.5*  HGB 10.2* 9.2* 9.9* 10.1*  HCT 31.2* 27.8* 30.3* 31.7*  PLT 585* 541* 610* 677*  MCV 93.1 92.4 93.2 93.5  MCH 30.4 30.6 30.5 29.8  MCHC 32.7 33.1 32.7 31.9  RDW 13.1 13.6 13.5 13.8  LYMPHSABS 1.8  --   --   --   MONOABS 1.4*  --   --   --   EOSABS 0.0  --   --   --   BASOSABS 0.1  --   --   --     Chemistries   Recent Labs Lab 04/15/16 1616 04/15/16 2252 04/16/16 0454 04/17/16 0530 04/18/16 0525 04/19/16 0520  NA 132*  --  137 137 136 137  K 4.3  --  3.8 4.1 4.2 4.8  CL 98*  --  104 103 102 101  CO2 24  --  26 27 26  28   GLUCOSE 170*  --  156* 124* 126* 114*  BUN 13  --  9 7 8 10   CREATININE 0.69 0.74 0.72 0.71 0.63 0.72  CALCIUM 8.5*  --  8.0* 8.3* 8.5* 8.6*  AST 74*  --   --  81* 69*  --   ALT 139*  --   --  205* 204*  --   ALKPHOS 124  --   --  147* 139*  --   BILITOT 0.4  --   --  0.3 0.4  --    ------------------------------------------------------------------------------------------------------------------ No results for input(s): CHOL, HDL, LDLCALC, TRIG, CHOLHDL, LDLDIRECT in the last 72 hours.  No results found for: HGBA1C ------------------------------------------------------------------------------------------------------------------ No results for input(s): TSH, T4TOTAL, T3FREE, THYROIDAB in the last 72 hours.  Invalid input(s): FREET3 ------------------------------------------------------------------------------------------------------------------ No results for input(s): VITAMINB12, FOLATE, FERRITIN, TIBC, IRON, RETICCTPCT in the last 72 hours.  Coagulation profile  Recent Labs Lab 04/16/16 1421  INR 1.21    No results for input(s): DDIMER in the last 72 hours.  Cardiac Enzymes  Recent Labs Lab 04/15/16 1616  TROPONINI <0.03   ------------------------------------------------------------------------------------------------------------------ No results found for: BNP  Inpatient Medications  Scheduled Meds: . budesonide (PULMICORT) nebulizer solution  0.5 mg Nebulization BID  . cyanocobalamin  1,000 mcg Intramuscular Daily  . enoxaparin (LOVENOX) injection  40 mg Subcutaneous Q24H  . feeding supplement (ENSURE ENLIVE)  237 mL Oral BID BM  . guaiFENesin  1,200 mg Oral BID  . ipratropium-albuterol  3 mL Nebulization TID  . nicotine  21 mg Transdermal Daily  . piperacillin-tazobactam (ZOSYN)  IV  3.375 g Intravenous Q8H  . protein supplement shake  11 oz Oral BID BM   Continuous Infusions: . sodium chloride 1,000 mL (04/19/16 1253)   PRN Meds:.albuterol, ibuprofen,  oxyCODONE-acetaminophen  Micro Results Recent Results (from the past 240 hour(s))  Blood culture (routine x 2)     Status: None (Preliminary result)   Collection Time: 04/15/16  4:40 PM  Result Value Ref Range Status   Specimen Description BLOOD LEFT HAND  Final   Special Requests BOTTLES DRAWN AEROBIC AND ANAEROBIC 5CC EACH  Final   Culture   Final    NO GROWTH 4 DAYS Performed at [facility]    Report Status PENDING  Incomplete  Blood culture (routine x 2)     Status: None (Preliminary result)  Collection Time: 04/15/16  4:55 PM  Result Value Ref Range Status   Specimen Description BLOOD RIGHT HAND  Final   Special Requests BOTTLES DRAWN AEROBIC AND ANAEROBIC 5CC EACH  Final   Culture   Final    NO GROWTH 4 DAYS Performed at [facility]    Report Status PENDING  Incomplete  Urine culture     Status: None   Collection Time: 04/15/16  7:08 PM  Result Value Ref Range Status   Specimen Description URINE, RANDOM  Final   Special Requests NONE  Final   Culture NO GROWTH Performed at [facility]   Final   Report Status 04/17/2016 FINAL  Final  Culture, sputum-assessment     Status: None   Collection Time: 04/15/16 10:01 PM  Result Value Ref Range Status   Specimen Description SPUTUM  Final   Special Requests NONE  Final   Sputum evaluation   Final    MICROSCOPIC FINDINGS SUGGEST THAT THIS SPECIMEN IS NOT REPRESENTATIVE OF LOWER RESPIRATORY SECRETIONS. PLEASE RECOLLECT. NOTIFIED RN LISA PATRAM @2336  ZANDO,C    Report Status 04/16/2016 FINAL  Final  Culture, blood (routine x 2) Call MD if unable to obtain prior to antibiotics being given     Status: None (Preliminary result)   Collection Time: 04/15/16 10:52 PM  Result Value Ref Range Status   Specimen Description BLOOD RIGHT ARM  Final   Special Requests BOTTLES DRAWN AEROBIC AND ANAEROBIC 6 CC  Final   Culture   Final    NO GROWTH 3 DAYS Performed at [facility]    Report Status PENDING   Incomplete  Culture, blood (routine x 2) Call MD if unable to obtain prior to antibiotics being given     Status: None (Preliminary result)   Collection Time: 04/15/16 10:52 PM  Result Value Ref Range Status   Specimen Description BLOOD RIGHT HAND  Final   Special Requests BOTTLES DRAWN AEROBIC AND ANAEROBIC 6 CC  Final   Culture   Final    NO GROWTH 3 DAYS Performed at [facility]    Report Status PENDING  Incomplete  Culture, expectorated sputum-assessment     Status: None   Collection Time: 04/17/16  5:58 AM  Result Value Ref Range Status   Specimen Description SPUTUM  Final   Special Requests NONE  Final   Sputum evaluation   Final    THIS SPECIMEN IS ACCEPTABLE. RESPIRATORY CULTURE REPORT TO FOLLOW.   Report Status 04/17/2016 FINAL  Final  Culture, respiratory (NON-Expectorated)     Status: None   Collection Time: 04/17/16  5:58 AM  Result Value Ref Range Status   Specimen Description SPUTUM  Final   Special Requests NONE  Final   Gram Stain   Final    ABUNDANT WBC PRESENT, PREDOMINANTLY PMN FEW SQUAMOUS EPITHELIAL CELLS PRESENT RARE GRAM POSITIVE COCCI IN PAIRS RARE GRAM NEGATIVE COCCI IN PAIRS    Culture   Final    Consistent with normal respiratory flora. Performed at [facility]    Report Status 04/19/2016 FINAL  Final    Radiology Reports Dg Chest 2 View  Result Date: 04/18/2016 CLINICAL DATA:  April 15, 2016 EXAM: CHEST  2 VIEW COMPARISON:  None. FINDINGS: Near complete collapse of left lung with minimal aeration in the left apex, similar in the interval. Abrupt cut off of the left mainstem bronchus, as appreciated on recent CT. The heart mediastinum are shifted into the left chest. Hyperexpansion  of the right lung is compensatory. The right lung is otherwise clear. No other interval changes. IMPRESSION: Near complete collapse left lung with abrupt cut off of left mainstem bronchus, similar in the interval. Electronically Signed   By: Dorise Bullion III M.D   On: 04/18/2016 09:05   Dg Chest 2 View  Result Date: 04/15/2016 CLINICAL DATA:  Left-sided chest pain and dyspnea x5 months. History of pneumonia 7 months ago that filled entire left lung. EXAM: CHEST  2 VIEW COMPARISON:  11/03/2012 FINDINGS: There is volume loss with tracheal deviation to the left and near complete whiteout of the left hemithorax. Compensatory hyperinflation of the right lung. Only a few aerated portions of left upper lobe are noted near the apex. The entire heart is obscured as is the aorta. No suspicious osseous abnormality. IMPRESSION: Near complete whiteout of the left lung with volume loss and mediastinal shift to the left. Compensatory hyperinflation of the normal-appearing right lung. Electronically Signed   By: Ashley Royalty M.D.   On: 04/15/2016 16:19   Ct Angio Chest Pe W And/or Wo Contrast  Result Date: 04/15/2016 CLINICAL DATA:  Cough for 5 months with dyspnea and weight loss. Left-sided chest pain. Current smoker. EXAM: CT ANGIOGRAPHY CHEST WITH CONTRAST TECHNIQUE: Multidetector CT imaging of the chest was performed using the standard protocol during bolus administration of intravenous contrast. Multiplanar CT image reconstructions and MIPs were obtained to evaluate the vascular anatomy. CONTRAST:  100 cc of Isovue 370 COMPARISON:  Same day CXR FINDINGS: Cardiovascular: The heart shifted toward the left hemithorax secondary to left lung collapse and volume loss. No acute pulmonary embolus. No aortic aneurysm or dissection. No pericardial effusion. Mediastinum/Nodes: There is prevascular lymphadenopathy measuring up to 12 mm short axis. No paratracheal, supraclavicular nor axillary lymphadenopathy. Lungs/Pleura: There is volume loss secondary to atelectasis and left lung collapse of the left upper lobe, lingula and left lower lobe. Tubular branching air like opacities are seen within the left lower consistent with air within bronchi and bronchioles. No confluent  areas of cavitation noted to suggest pulmonary necrosis on current exam. Soft tissue densities are noted abruptly terminating the distal left main stem bronchus. An endoluminal mass or mucous causing the left lung collapse is not excluded. Faint ground-glass 7 mm in average and 5 mm in average nodular densities in the right upper lobe are noted, series 4 image 48 and 49. No effusion or pneumothorax on the right. Upper Abdomen: No acute abnormality within the abdomen. No enhancing hepatic mass. No definite adrenal nodule to the extent that they are included. Musculoskeletal: Nonacute Review of the MIP images confirms the above findings. IMPRESSION: 1. Left lung atelectasis/collapse with volume loss and shift of the mediastinum to the left. Soft tissue densities in the distal mainstem bronchus may be secondary to is inspissation. Endobronchial mass or lesion is not entirely excluded. No definite evidence of pulmonary necrosis. Mild prevascular lymphadenopathy. 2. No acute pulmonary embolus. 3. 7 mm and 5 mm right upper lobe ground-glass and nodular opacities. Initial follow-up with CT at 6-12 months is recommended to confirm persistence. If persistent, repeat CT is recommended every 2 years until 5 years of stability has been established. This recommendation follows the consensus statement: Guidelines for Management of Incidental Pulmonary Nodules Detected on CT Images: From the Fleischner Society 2017; Radiology 2017; 284:228-243. Electronically Signed   By: Ashley Royalty M.D.   On: 04/15/2016 18:17   US Abdomen Limited Ruq  Result Date: 04/18/2016 CLINICAL DATA:  Elevated LFTs. EXAM: US ABDOMEN LIMITED - RIGHT UPPER QUADRANT COMPARISON:  None. FINDINGS: Gallbladder: No gallstones or wall thickening visualized. No sonographic Murphy sign noted by sonographer. Two small nonshadowing and non mobile echogenic foci along the gallbladder wall near the gallbladder neck with the largest measuring 4 mm likely polyps.  Common bile duct: Diameter: 1.7 mm. Liver: No focal lesion identified. Within normal limits in parenchymal echogenicity. IMPRESSION: No acute hepatobiliary disease. Two probable small polyps near the gallbladder base with the larger measuring 4 mm. Electronically Signed   By: Marin Olp M.D.   On: 04/18/2016 14:40     Timberlee Roblero M.D on 04/19/2016 at 3:14 PM  Between 7am to 7pm - Pager - [phone]  After 7pm go to [email or web address] - password [facility]  [facility] -  Office  [phone]

## 2016-04-19 NOTE — Care Management Note (Signed)
Case Management Note  Patient Details  Name: Douglas Kelly MRN: [id number] Date of Birth: 1970/09/09  Subjective/Objective:  45 y/o m admitted w/PNA. From home.                  Action/Plan:d/c plan home.  Expected Discharge Date:                Expected Discharge Plan:  Home/Self Care  In-House Referral:     Discharge planning Services  CM Consult  Post Acute Care Choice:    Choice offered to:     DME Arranged:    DME Agency:     HH Arranged:    HH Agency:     Status of Service:  In process, will continue to follow  If discussed at Long Length of Stay Meetings, dates discussed:    Additional Comments:  Dessa Phi, RN 04/19/2016, 3:10 PM

## 2016-04-19 NOTE — H&P (Signed)
  [facility] PCCM  HPI: Douglas Kelly presents with left lung collapse in the setting of cough, weight loss, and heavy tobacco use prior. He is here for a bronchoscopy. He feels OK now.  Past Medical History:  Diagnosis Date  . Pneumonia      History reviewed. No pertinent family history.   Social History   Social History  . Marital status: Single    Spouse name: N/A  . Number of children: N/A  . Years of education: N/A   Occupational History  . Not on file.   Social History Main Topics  . Smoking status: Current Every Day Smoker    Packs/day: 1.00    Types: Cigarettes  . Smokeless tobacco: Never Used  . Alcohol use No  . Drug use: No  . Sexual activity: No   Other Topics Concern  . Not on file   Social History Narrative  . No narrative on file     No Known Allergies   '@encmedstart'$ @  Vitals:   04/19/16 0855 04/19/16 0900 04/19/16 0905 04/19/16 0910  BP: 119/72 123/70 119/71 120/66  Pulse:      Resp: '20 16 20 '$ (!) 21  Temp:      TempSrc:      SpO2: 93% 96% 96% 96%  Weight:      Height:       RA  Gen: well appearing HENT: OP clear, neck supple PULM: no breaths sounds left lung CV: RRR, no mgr, trace edema GI: BS+, soft, nontender Derm: no cyanosis or rash Psyche: normal mood and affect  CBC    Component Value Date/Time   WBC 12.5 (H) 04/19/2016 0520   RBC 3.39 (L) 04/19/2016 0520   HGB 10.1 (L) 04/19/2016 0520   HCT 31.7 (L) 04/19/2016 0520   PLT 677 (H) 04/19/2016 0520   MCV 93.5 04/19/2016 0520   MCH 29.8 04/19/2016 0520   MCHC 31.9 04/19/2016 0520   RDW 13.8 04/19/2016 0520   LYMPHSABS 1.8 04/15/2016 1616   MONOABS 1.4 (H) 04/15/2016 1616   EOSABS 0.0 04/15/2016 1616   BASOSABS 0.1 04/15/2016 1616   Images from his CT chest and CXR reviewed> left lung white out, no clear mass but abrupt cut off of left mainstem bronchus noted  Impression/Plan: Left lung/mainstem bronchus obstruction in a smoker, very worrisome for lung cancer, ddx includes mucus  plugging but this seems less likely.  Plan bronchoscopy today, he was informed of the risks and benefits.  Roselie Awkward, MD [place] P[facility]M Pager: [phone] Cell: [phone] After 3pm or if no response, call [phone]

## 2016-04-19 NOTE — Progress Notes (Signed)
Video bronchoscopy performed Intervention bronchial washings Intervention bronchial brushings Intervention bronchial biopsies Pt tolerated well  Geanna Divirgilio David RRT  

## 2016-04-19 NOTE — Op Note (Signed)
[facility] Cardiopulmonary Patient Name: Douglas Kelly Procedure Date: 04/19/2016 MRN: [id number] Attending MD: Juanito Doom , MD Date of Birth: 11-27-1970 CSN: [id number] Age: 45 Admit Type: Inpatient Ethnicity: Not Hispanic or Latino Procedure:            Bronchoscopy Indications:          Left mainstem mass, Lung collapse Providers:            Nathaneil Canary B. Lake Bells, MD, Andre Lefort RRT,RCP,                        Ashley Mariner RRT,RCP Referring MD:          Medicines:            Lidocaine 1% applied to cords 6 mL, Fentanyl 150 mcg                        IV, Midazolam 6 mg IV, Lidocaine 1% subglottic space 6                        mL Complications:        No immediate complications Estimated Blood Loss: Estimated blood loss was minimal. Procedure:      Pre-Anesthesia Assessment:      - A History and Physical has been performed. Patient meds and allergies       have been reviewed. The risks and benefits of the procedure and the       sedation options and risks were discussed with the patient. All       questions were answered and informed consent was obtained. Patient       identification and proposed procedure were verified prior to the       procedure by the physician and the technician in the procedure room.       Mental Status Examination: alert and oriented. Airway Examination:       normal oropharyngeal airway. Respiratory Examination: poor air movement       in the left lung. CV Examination: normal. ASA Grade Assessment: II - A       patient with mild systemic disease. After reviewing the risks and       benefits, the patient was deemed in satisfactory condition to undergo       the procedure. The anesthesia plan was to use moderate sedation /       analgesia (conscious sedation). Immediately prior to administration of       medications, the patient was re-assessed for adequacy to receive       sedatives. The heart rate, respiratory rate, oxygen  saturations, blood       pressure, adequacy of pulmonary ventilation, and response to care were       monitored throughout the procedure. The physical status of the patient       was re-assessed after the procedure.      After obtaining informed consent, the bronchoscope was passed under       direct vision. Throughout the procedure, the patient's blood pressure,       pulse, and oxygen saturations were monitored continuously. the [id number]       [id number] scope was introduced through the mouth and advanced to the       tracheobronchial tree of both lungs. The procedure was accomplished       without difficulty. The patient tolerated the procedure fairly well.  The       total duration of the procedure was 17 minutes. Findings:      The nasopharynx/oropharynx appears normal. The larynx appears normal.       The vocal cords appear normal. The subglottic space is normal. The       trachea is of normal caliber. The carina is sharp. The tracheobronchial       tree of the right lung was examined to at least the first subsegmental       level. Bronchial mucosa and anatomy in the right lung are normal; there       are no endobronchial lesions, and no secretions.      Left Lung Abnormalities: A completely obstructing mass was found       distally in the left mainstem bronchus. The mass was large and       exophytic, friable and polypoid. The lesion was not traversed.      Washings were obtained in the left mainstem bronchus and sent for       routine cytology. The return was blood-tinged.      Brushings of a mass were obtained in the left mainstem bronchus with a       cytology brush and sent for routine cytology. Two samples were obtained.      Endobronchial biopsies of a mass were performed in the left mainstem       bronchus using a forceps and sent for histopathology examination. Three       samples were obtained. Impression:      - Left mainstem mass      - Lung collapse      - The right lung  was normal.      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      - Washings were obtained.      - Brushings were obtained.      - An endobronchial biopsy was performed. Moderate Sedation:      Moderate (conscious) sedation was personally administered by the       endoscopist. The following parameters were monitored: oxygen saturation,       heart rate, blood pressure, and response to care. Total physician       intraservice time was 22 minutes. Recommendation:      - Await test results. Procedure Code(s):      --- Professional ---      [phone], Bronchoscopy, rigid or flexible, including fluoroscopic guidance,       when performed; with bronchial or endobronchial biopsy(s), single or       multiple sites      31623, Bronchoscopy, rigid or flexible, including fluoroscopic guidance,       when performed; with brushing or protected brushings      99152, Moderate sedation services provided by the same physician or       other qualified health care professional performing the diagnostic or       therapeutic service that the sedation supports, requiring the presence       of an independent trained observer to assist in the monitoring of the       patient's level of consciousness and physiological status; initial 15       minutes of intraservice time, patient age 83 years or older Diagnosis Code(s):      --- Professional ---      R91.8, Other  nonspecific abnormal finding of lung field      J98.19, Other pulmonary collapse      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 All rights reserved. The codes documented in this report are preliminary and upon coder review may  be revised to meet current compliance requirements. Norlene Campbell, MD Juanito Doom, MD 04/19/2016 10:00:56 AM This report has been signed electronically. Number of Addenda: 0 Scope In:  9:31:16 AM Scope Out: 9:48:47 AM

## 2016-04-19 NOTE — Progress Notes (Signed)
Pt refuse CPT vest at this time. Breath sounds decrease aeration throughout. Pt has a strong cough noted, non-productive cough, no secretions noted at this time. Family at bedside

## 2016-04-20 ENCOUNTER — Encounter (HOSPITAL_COMMUNITY): Payer: Self-pay | Admitting: Pulmonary Disease

## 2016-04-20 ENCOUNTER — Inpatient Hospital Stay (HOSPITAL_COMMUNITY): Payer: Medicaid Other

## 2016-04-20 DIAGNOSIS — C3482 Malignant neoplasm of overlapping sites of left bronchus and lung: Secondary | ICD-10-CM

## 2016-04-20 DIAGNOSIS — C3492 Malignant neoplasm of unspecified part of left bronchus or lung: Secondary | ICD-10-CM

## 2016-04-20 DIAGNOSIS — R05 Cough: Secondary | ICD-10-CM

## 2016-04-20 DIAGNOSIS — R918 Other nonspecific abnormal finding of lung field: Secondary | ICD-10-CM

## 2016-04-20 DIAGNOSIS — R0602 Shortness of breath: Secondary | ICD-10-CM

## 2016-04-20 LAB — COMPREHENSIVE METABOLIC PANEL
ALK PHOS: 107 U/L (ref 38–126)
ALT: 117 U/L — AB (ref 17–63)
AST: 41 U/L (ref 15–41)
Albumin: 2.4 g/dL — ABNORMAL LOW (ref 3.5–5.0)
Anion gap: 7 (ref 5–15)
BUN: 12 mg/dL (ref 6–20)
CALCIUM: 8.4 mg/dL — AB (ref 8.9–10.3)
CHLORIDE: 102 mmol/L (ref 101–111)
CO2: 26 mmol/L (ref 22–32)
CREATININE: 0.67 mg/dL (ref 0.61–1.24)
GFR calc non Af Amer: >60 mL/min (ref >60)
GLUCOSE: 142 mg/dL — AB (ref 65–99)
Potassium: 4.2 mmol/L (ref 3.5–5.1)
SODIUM: 135 mmol/L (ref 135–145)
Total Bilirubin: 0.6 mg/dL (ref 0.3–1.2)
Total Protein: 6.4 g/dL — ABNORMAL LOW (ref 6.5–8.1)

## 2016-04-20 LAB — CULTURE, BLOOD (ROUTINE X 2)
Culture: NO GROWTH
Culture: NO GROWTH

## 2016-04-20 LAB — CBC
HCT: 31.7 % — ABNORMAL LOW (ref 39.0–52.0)
Hemoglobin: 10.2 g/dL — ABNORMAL LOW (ref 13.0–17.0)
MCH: 29.9 pg (ref 26.0–34.0)
MCHC: 32.2 g/dL (ref 30.0–36.0)
MCV: 93 fL (ref 78.0–100.0)
PLATELETS: 679 10*3/uL — AB (ref 150–400)
RBC: 3.41 MIL/uL — ABNORMAL LOW (ref 4.22–5.81)
RDW: 13.8 % (ref 11.5–15.5)
WBC: 13.3 10*3/uL — ABNORMAL HIGH (ref 4.0–10.5)

## 2016-04-20 MED ORDER — GADOBENATE DIMEGLUMINE 529 MG/ML IV SOLN: 15.0000 mL | Freq: Once | INTRAVENOUS | Status: DC | PRN

## 2016-04-20 MED ORDER — SODIUM CHLORIDE 0.9 % IJ SOLN
INTRAMUSCULAR | Status: AC
  Filled 2016-04-20: qty 50

## 2016-04-20 MED ORDER — IOPAMIDOL (ISOVUE-300) INJECTION 61%
INTRAVENOUS | Status: AC
  Filled 2016-04-20: qty 100

## 2016-04-20 MED ORDER — HYDROCOD POLST-CPM POLST ER 10-8 MG/5ML PO SUER
5.0000 mL | Freq: Once | ORAL | Status: AC
  Administered 2016-04-20: 5 mL via ORAL
  Filled 2016-04-20: qty 5

## 2016-04-20 MED ORDER — HYDROCOD POLST-CPM POLST ER 10-8 MG/5ML PO SUER
5.0000 mL | Freq: Two times a day (BID) | ORAL | Status: DC | PRN
  Administered 2016-04-21: 5 mL via ORAL
  Filled 2016-04-20: qty 5

## 2016-04-20 MED ORDER — LORAZEPAM 2 MG/ML IJ SOLN
1.0000 mg | Freq: Once | INTRAMUSCULAR | Status: AC | PRN
  Administered 2016-04-20: 1 mg via INTRAVENOUS
  Filled 2016-04-20: qty 1

## 2016-04-20 NOTE — Care Management Note (Signed)
Case Management Note  Patient Details  Name: RONNY KORFF MRN: [id number] Date of Birth: 12/30/1970  Subjective/Objective:Spoke to patient in rm about d/c plans. No pcp, no health insurance-Provided patient w/health insurance info, will provide pcp listing for [employer], [employer] med list given.                    Action/Plan:d/c plan home.   Expected Discharge Date:                Expected Discharge Plan:  Home/Self Care  In-House Referral:     Discharge planning Services  CM Consult  Post Acute Care Choice:    Choice offered to:     DME Arranged:    DME Agency:     HH Arranged:    HH Agency:     Status of Service:  In process, will continue to follow  If discussed at Long Length of Stay Meetings, dates discussed:    Additional Comments:  Dessa Phi, RN 04/20/2016, 1:04 PM

## 2016-04-20 NOTE — Progress Notes (Signed)
PROGRESS NOTE                                                                                                                                                                                                             Patient Demographics:    Douglas Kelly, is a 45 y.o. male, DOB - 12-16-1970, LAG:[id number]  Admit date - 04/15/2016   Admitting Physician Roney Jaffe, MD  Outpatient Primary MD for the patient is No primary care provider on file.  LOS - 5   Chief Complaint  Patient presents with  . Cough       Brief Narrative   45 y.o. male with no sig PMHx, on no medications presents with of cough, sweats , wt loss and poor appetite.  CXR showed complete white out of L chest w mediastinal L shift c/w some lung collapse.  CT chest showed Lsided lobar collapse with L shift, air bronchograms. 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    Subjective:    [name] today has, No headache, No chest pain, No abdominal pain - No Nausea, Report generalized weakness and cough,   Assessment  & Plan :    Principal Problem:   CAP (community acquired pneumonia) Active Problems:   Cough   Fever   Leukocytosis   Loss of weight   Normocytic anemia   LFTs abnormal   Smoker   Collapse of left lung, hx of pneumonia 5 months ago   Pneumonia of left upper lobe due to infectious organism (HCC)   Protein-calorie malnutrition, severe  Sepsis due to Postobstructive Pneumonia with left lung mass - Patient presents with fever, tachycardia, tachypnea, elevated lactic acid and leukocytosis, sepsis criteria met on admission. - This is secondary to pneumonia with left lung collapse, follow on cultures and sputum cultures. - Continue with broad-spectrum antibiotics including IV vancomycin and Zosyn giving severity of illness.  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 Earlie Server regarding further recommendation(deviation versus repeat bronchoscopy for further sampling for testing) - CT abdomen and  pelvis 10 for staging with no evidence of metastasis, unable to tolerate MRI even with Ativan, so obtain CT head with and without contrast to rule out metastases in brain. - Status post bronchoscopy 12/11, with evidence of mass in the left bronchus. - Continue chest PT including flutter valve , vest and Mucinex.  Right lung nodules - Pulm  Consulted  Elevated LFTs - Most likely to sepsis, remained stable, negative hepatitis panel, no acute finding in right upper quadrant ultrasound .   Tobacco abuse - Counseled, requesting a nicotine patch  Anemia - Anemia of chronic illness, B12 level is borderline, active, will supplement for t 3 days, then was transitioned to oral  Severe protein calorie malnutrition - started on supplements    Code Status : Full  Family Communication  : wife at bedside  Disposition Plan  : pending further work up.   Consults  :  PCCM  Procedures  :  bronchoscopy 12/11 by Dr. Curt Jews, oncology consult pending  DVT Prophylaxis  :  Lovenox   Lab Results  Component Value Date   PLT 679 (H) 04/20/2016    Antibiotics  :   Anti-infectives    Start     Dose/Rate Route Frequency Ordered Stop   04/16/16 0600  vancomycin (VANCOCIN) IVPB 1000 mg/200 mL premix  Status:  Discontinued     1,000 mg 200 mL/hr over 60 Minutes Intravenous Kelly 8 hours 04/16/16 0242 04/19/16 1513   04/15/16 2215  piperacillin-tazobactam (ZOSYN) IVPB 3.375 g     3.375 g 12.5 mL/hr over 240 Minutes Intravenous Kelly 8 hours 04/15/16 2210     04/15/16  1828  vancomycin (VANCOCIN) 500 MG powder    Comments:  Peel, Adrienne   : cabinet override      04/15/16 1828 04/16/16 0644   04/15/16 1649  vancomycin (VANCOCIN) 500 MG powder    Comments:  Peel, Adrienne   : cabinet override      04/15/16 1649 04/15/16 1830   04/15/16 1649  vancomycin (VANCOCIN) 1-5 GM/200ML-% IVPB    Comments:  Peel, Adrienne   : cabinet override      04/15/16 1649 04/15/16 1830   04/15/16 1645  piperacillin-tazobactam (ZOSYN) IVPB 3.375 g     3.375 g 100 mL/hr over 30 Minutes Intravenous  Once 04/15/16 1638 04/15/16 1723   04/15/16 1645  vancomycin (VANCOCIN) 1,270 mg in sodium chloride 0.9 % 500 mL IVPB  Status:  Discontinued     20 mg/kg  63.5 kg 250 mL/hr over 120 Minutes Intravenous  Once 04/15/16 1638 04/16/16 0241        Objective:   Vitals:   04/19/16 2040 04/20/16 0459 04/20/16 1300 04/20/16 1407  BP: (!) 105/59 113/62 118/62   Pulse: 90 79 98   Resp: 17 18 16    Temp: 98.2 F (36.8 C) 97.8 F (36.6 C) 98.6 F (37 C)   TempSrc: Oral Oral Oral   SpO2: 96% 96% 98% 99%  Weight:      Height:        Wt Readings from Last 3 Encounters:  04/15/16 68.1 kg (150 lb 2.1 oz)  11/03/12 67.1 kg (148 lb)     Intake/Output Summary (Last 24 hours) at 04/20/16 1417 Last data filed at 04/20/16 0200  Gross per 24 hour  Intake             1970 ml  Output                0  ml  Net             1970 ml     Physical Exam  Awake Alert, Oriented X 3, No new F.N deficits, Normal affect Supple Neck,No JVD,  Symmetrical Chest wall movement, decreased  air movement on left, no wheezing. RRR,No Gallops,Rubs or new Murmurs, No Parasternal Heave +ve B.Sounds, Abd Soft, No tenderness, No rebound - guarding or rigidity. No Cyanosis, Clubbing or edema, No new Rash or bruise     Data Review:    CBC  Recent Labs Lab 04/15/16 1616 04/15/16 2252 04/17/16 0530 04/19/16 0520 04/20/16 0534  WBC 23.2* 20.7* 17.9* 12.5* 13.3*  HGB 10.2* 9.2* 9.9* 10.1* 10.2*    HCT 31.2* 27.8* 30.3* 31.7* 31.7*  PLT 585* 541* 610* 677* 679*  MCV 93.1 92.4 93.2 93.5 93.0  MCH 30.4 30.6 30.5 29.8 29.9  MCHC 32.7 33.1 32.7 31.9 32.2  RDW 13.1 13.6 13.5 13.8 13.8  LYMPHSABS 1.8  --   --   --   --   MONOABS 1.4*  --   --   --   --   EOSABS 0.0  --   --   --   --   BASOSABS 0.1  --   --   --   --     Chemistries   Recent Labs Lab 04/15/16 1616  04/16/16 0454 04/17/16 0530 04/18/16 0525 04/19/16 0520 04/20/16 0534  NA 132*  --  137 137 136 137 135  K 4.3  --  3.8 4.1 4.2 4.8 4.2  CL 98*  --  104 103 102 101 102  CO2 24  --  26 27 26 28 26   GLUCOSE 170*  --  156* 124* 126* 114* 142*  BUN 13  --  9 7 8 10 12   CREATININE 0.69  < > 0.72 0.71 0.63 0.72 0.67  CALCIUM 8.5*  --  8.0* 8.3* 8.5* 8.6* 8.4*  AST 74*  --   --  81* 69*  --  41  ALT 139*  --   --  205* 204*  --  117*  ALKPHOS 124  --   --  147* 139*  --  107  BILITOT 0.4  --   --  0.3 0.4  --  0.6  < > = values in this interval not displayed. ------------------------------------------------------------------------------------------------------------------ No results for input(s): CHOL, HDL, LDLCALC, TRIG, CHOLHDL, LDLDIRECT in the last 72 hours.  No results found for: HGBA1C ------------------------------------------------------------------------------------------------------------------ No results for input(s): TSH, T4TOTAL, T3FREE, THYROIDAB in the last 72 hours.  Invalid input(s): FREET3 ------------------------------------------------------------------------------------------------------------------ No results for input(s): VITAMINB12, FOLATE, FERRITIN, TIBC, IRON, RETICCTPCT in the last 72 hours.  Coagulation profile  Recent Labs Lab 04/16/16 1421  INR 1.21    No results for input(s): DDIMER in the last 72 hours.  Cardiac Enzymes  Recent Labs Lab 04/15/16 1616  TROPONINI <0.03    ------------------------------------------------------------------------------------------------------------------ No results found for: BNP  Inpatient Medications  Scheduled Meds: . budesonide (PULMICORT) nebulizer solution  0.5 mg Nebulization BID  . cyanocobalamin  1,000 mcg Intramuscular Daily  . enoxaparin (LOVENOX) injection  40 mg Subcutaneous Q24H  . feeding supplement (ENSURE ENLIVE)  237 mL Oral BID BM  . guaiFENesin  1,200 mg Oral BID  . ipratropium-albuterol  3 mL Nebulization TID  . nicotine  21 mg Transdermal Daily  . piperacillin-tazobactam (ZOSYN)  IV  3.375 g Intravenous Q8H  . protein supplement shake  11 oz Oral BID BM   Continuous  Infusions: . sodium chloride 1,000 mL (04/19/16 2202)   PRN Meds:.albuterol, gadobenate dimeglumine, ibuprofen, iopamidol, morphine injection, oxyCODONE-acetaminophen  Micro Results Recent Results (from the past 240 hour(s))  Blood culture (routine x 2)     Status: None   Collection Time: 04/15/16  4:40 PM  Result Value Ref Range Status   Specimen Description BLOOD LEFT HAND  Final   Special Requests BOTTLES DRAWN AEROBIC AND ANAEROBIC 5CC EACH  Final   Culture   Final    NO GROWTH 5 DAYS Performed at [facility]    Report Status 04/20/2016 FINAL  Final  Blood culture (routine x 2)     Status: None   Collection Time: 04/15/16  4:55 PM  Result Value Ref Range Status   Specimen Description BLOOD RIGHT HAND  Final   Special Requests BOTTLES DRAWN AEROBIC AND ANAEROBIC 5CC EACH  Final   Culture   Final    NO GROWTH 5 DAYS Performed at [facility]    Report Status 04/20/2016 FINAL  Final  Urine culture     Status: None   Collection Time: 04/15/16  7:08 PM  Result Value Ref Range Status   Specimen Description URINE, RANDOM  Final   Special Requests NONE  Final   Culture NO GROWTH Performed at [facility]   Final   Report Status 04/17/2016 FINAL  Final  Culture, sputum-assessment     Status: None    Collection Time: 04/15/16 10:01 PM  Result Value Ref Range Status   Specimen Description SPUTUM  Final   Special Requests NONE  Final   Sputum evaluation   Final    MICROSCOPIC FINDINGS SUGGEST THAT THIS SPECIMEN IS NOT REPRESENTATIVE OF LOWER RESPIRATORY SECRETIONS. PLEASE RECOLLECT. NOTIFIED RN LISA PATRAM @[id number]  ZANDO,C    Report Status 04/16/2016 FINAL  Final  Culture, blood (routine x 2) Call MD if unable to obtain prior to antibiotics being given     Status: None (Preliminary result)   Collection Time: 04/15/16 10:52 PM  Result Value Ref Range Status   Specimen Description BLOOD RIGHT ARM  Final   Special Requests BOTTLES DRAWN AEROBIC AND ANAEROBIC 6 CC  Final   Culture   Final    NO GROWTH 4 DAYS Performed at [facility]    Report Status PENDING  Incomplete  Culture, blood (routine x 2) Call MD if unable to obtain prior to antibiotics being given     Status: None (Preliminary result)   Collection Time: 04/15/16 10:52 PM  Result Value Ref Range Status   Specimen Description BLOOD RIGHT HAND  Final   Special Requests BOTTLES DRAWN AEROBIC AND ANAEROBIC 6 CC  Final   Culture   Final    NO GROWTH 4 DAYS Performed at [facility]    Report Status PENDING  Incomplete  Culture, expectorated sputum-assessment     Status: None   Collection Time: 04/17/16  5:58 AM  Result Value Ref Range Status   Specimen Description SPUTUM  Final   Special Requests NONE  Final   Sputum evaluation   Final    THIS SPECIMEN IS ACCEPTABLE. RESPIRATORY CULTURE REPORT TO FOLLOW.   Report Status 04/17/2016 FINAL  Final  Culture, respiratory (NON-Expectorated)     Status: None   Collection Time: 04/17/16  5:58 AM  Result Value Ref Range Status   Specimen Description SPUTUM  Final   Special Requests NONE  Final   Gram Stain   Final  ABUNDANT WBC PRESENT, PREDOMINANTLY PMN FEW SQUAMOUS EPITHELIAL CELLS PRESENT RARE GRAM POSITIVE COCCI IN PAIRS RARE GRAM NEGATIVE COCCI IN PAIRS     Culture   Final    Consistent with normal respiratory flora. Performed at [facility]    Report Status 04/19/2016 FINAL  Final    Radiology Reports Dg Chest 2 View  Result Date: 04/18/2016 CLINICAL DATA:  April 15, 2016 EXAM: CHEST  2 VIEW COMPARISON:  None. FINDINGS: Near complete collapse of left lung with minimal aeration in the left apex, similar in the interval. Abrupt cut off of the left mainstem bronchus, as appreciated on recent CT. The heart mediastinum are shifted into the left chest. Hyperexpansion of the right lung is compensatory. The right lung is otherwise clear. No other interval changes. IMPRESSION: Near complete collapse left lung with abrupt cut off of left mainstem bronchus, similar in the interval. Electronically Signed   By: Dorise Bullion III M.D   On: 04/18/2016 09:05   Dg Chest 2 View  Result Date: 04/15/2016 CLINICAL DATA:  Left-sided chest pain and dyspnea x5 months. History of pneumonia 7 months ago that filled entire left lung. EXAM: CHEST  2 VIEW COMPARISON:  11/03/2012 FINDINGS: There is volume loss with tracheal deviation to the left and near complete whiteout of the left hemithorax. Compensatory hyperinflation of the right lung. Only a few aerated portions of left upper lobe are noted near the apex. The entire heart is obscured as is the aorta. No suspicious osseous abnormality. IMPRESSION: Near complete whiteout of the left lung with volume loss and mediastinal shift to the left. Compensatory hyperinflation of the normal-appearing right lung. Electronically Signed   By: Ashley Royalty M.D.   On: 04/15/2016 16:19   Ct Angio Chest Pe W And/or Wo Contrast  Result Date: 04/15/2016 CLINICAL DATA:  Cough for 5 months with dyspnea and weight loss. Left-sided chest pain. Current smoker. EXAM: CT ANGIOGRAPHY CHEST WITH CONTRAST TECHNIQUE: Multidetector CT imaging of the chest was performed using the standard protocol during bolus administration of intravenous  contrast. Multiplanar CT image reconstructions and MIPs were obtained to evaluate the vascular anatomy. CONTRAST:  100 cc of Isovue 370 COMPARISON:  Same day CXR FINDINGS: Cardiovascular: The heart shifted toward the left hemithorax secondary to left lung collapse and volume loss. No acute pulmonary embolus. No aortic aneurysm or dissection. No pericardial effusion. Mediastinum/Nodes: There is prevascular lymphadenopathy measuring up to 12 mm short axis. No paratracheal, supraclavicular nor axillary lymphadenopathy. Lungs/Pleura: There is volume loss secondary to atelectasis and left lung collapse of the left upper lobe, lingula and left lower lobe. Tubular branching air like opacities are seen within the left lower consistent with air within bronchi and bronchioles. No confluent areas of cavitation noted to suggest pulmonary necrosis on current exam. Soft tissue densities are noted abruptly terminating the distal left main stem bronchus. An endoluminal mass or mucous causing the left lung collapse is not excluded. Faint ground-glass 7 mm in average and 5 mm in average nodular densities in the right upper lobe are noted, series 4 image 48 and 49. No effusion or pneumothorax on the right. Upper Abdomen: No acute abnormality within the abdomen. No enhancing hepatic mass. No definite adrenal nodule to the extent that they are included. Musculoskeletal: Nonacute Review of the MIP images confirms the above findings. IMPRESSION: 1. Left lung atelectasis/collapse with volume loss and shift of the mediastinum to the left. Soft tissue densities in the distal mainstem bronchus may be secondary  to is inspissation. Endobronchial mass or lesion is not entirely excluded. No definite evidence of pulmonary necrosis. Mild prevascular lymphadenopathy. 2. No acute pulmonary embolus. 3. 7 mm and 5 mm right upper lobe ground-glass and nodular opacities. Initial follow-up with CT at 6-12 months is recommended to confirm persistence. If  persistent, repeat CT is recommended Kelly 2 years until 5 years of stability has been established. This recommendation follows the consensus statement: Guidelines for Management of Incidental Pulmonary Nodules Detected on CT Images: From the Fleischner Society 2017; Radiology 2017; 284:228-243. Electronically Signed   By: Ashley Royalty M.D.   On: 04/15/2016 18:17   Mr Brain Wo Contrast  Result Date: 04/20/2016 CLINICAL DATA:  Lung mass.  Rule out metastatic disease EXAM: MRI HEAD WITHOUT CONTRAST TECHNIQUE: Multiplanar, multiecho pulse sequences of the brain and surrounding structures were obtained without intravenous contrast. COMPARISON:  CT head 11/03/2012 FINDINGS: Incomplete study. Sagittal T1 and axial diffusion only were obtained. The patient refused any further imaging. Ventricle size normal. Negative for acute infarct. No large mass lesion identified. Pituitary not enlarged. Calvarium is intact right IMPRESSION: Incomplete study. There is not adequate information to rule out metastatic disease. No large mass identified. If the patient refuses further MRI scanning with sedation, CT head without with contrast is suggested for staging. Electronically Signed   By: Franchot Gallo M.D.   On: 04/20/2016 06:59   Ct Abdomen Pelvis W Contrast  Result Date: 04/19/2016 CLINICAL DATA:  45 year old male presents with cough weight loss and poor appetite. EXAM: CT ABDOMEN AND PELVIS WITH CONTRAST TECHNIQUE: Multidetector CT imaging of the abdomen and pelvis was performed using the standard protocol following bolus administration of intravenous contrast. CONTRAST:  124m ISOVUE-300 IOPAMIDOL (ISOVUE-300) INJECTION 61% COMPARISON:  Chest CT dated 04/15/2016 FINDINGS: Lower chest: There is complete consolidative changes of the left lung base as seen on the prior CT with associated volume loss and compensatory expansion of the right lung. There is shift of the mediastinum to the to the left hemithorax as seen on the  prior CT. These findings are better evaluated on the chest CT of 04/15/2016. Correlation with clinical exam and follow-up to resolution recommended. The visualized right lung base is clear. There is no intra-abdominal free air.  No free fluid noted. Hepatobiliary: No focal liver abnormality is seen. No gallstones, gallbladder wall thickening, or biliary dilatation. Pancreas: Unremarkable. No pancreatic ductal dilatation or surrounding inflammatory changes. Spleen: Normal in size without focal abnormality. Adrenals/Urinary Tract: Adrenal glands are unremarkable. Kidneys are normal, without renal calculi, focal lesion, or hydronephrosis. Bladder is unremarkable. Stomach/Bowel: There is moderate amount of stool throughout the colon. There is no evidence of bowel obstruction or active inflammation. Normal appendix. Vascular/Lymphatic: No significant vascular findings are present. No enlarged abdominal or pelvic lymph nodes. Reproductive: The prostate and seminal vesicles are grossly unremarkable. Other: There is mild diffuse subcutaneous edema. No fluid collection. Musculoskeletal: There is diffuse disc bulge with disc desiccation and vacuum phenomena at L5-S1. Mild chronic bilateral sacroiliitis. The osseous structures are otherwise intact. IMPRESSION: No acute intra-abdominopelvic pathology. No findings suspicious of malignancy within the abdomen or pelvis. Complete consolidative changes of the visualized left lung base with volume loss and shift of the mediastinum into the left hemithorax as seen on the prior CT. Correlation with clinical exam and follow-up to resolution is recommended. Electronically Signed   By: AAnner CreteM.D.   On: 04/19/2016 21:53   UKoreaAbdomen Limited Ruq  Result Date: 04/18/2016 CLINICAL DATA:  Elevated LFTs. EXAM: US ABDOMEN LIMITED - RIGHT UPPER QUADRANT COMPARISON:  None. FINDINGS: Gallbladder: No gallstones or wall thickening visualized. No sonographic Murphy sign noted by  sonographer. Two small nonshadowing and non mobile echogenic foci along the gallbladder wall near the gallbladder neck with the largest measuring 4 mm likely polyps. Common bile duct: Diameter: 1.7 mm. Liver: No focal lesion identified. Within normal limits in parenchymal echogenicity. IMPRESSION: No acute hepatobiliary disease. Two probable small polyps near the gallbladder base with the larger measuring 4 mm. Electronically Signed   By: Marin Olp M.D.   On: 04/18/2016 14:40     Misako Roeder M.D on 04/20/2016 at 2:17 PM  Between 7am to 7pm - Pager - [phone]  After 7pm go to [email or web address] - password [email or web address]  [facility] -  Office  [phone]

## 2016-04-20 NOTE — Progress Notes (Signed)
[facility]  Pathology results reviewed: Cytology consistent with non-small cell carcinoma, however endobronchial biopsies were benign.  Await oncology consultation. I would be happy to perform a repeat bronchoscopy if necessary to help facilitate further tissue for further clarification of tissue differentiation and gene mutations. However, he had a severe cough during the case which was very difficult to suppress making the bronchoscopy more difficult. We would need to perform a bronchoscopy under general anesthesia in order to facilitate a repeat biopsy.  Please call us back if further tissue biopsy is desired.  Roselie Awkward, MD [facility] Pager: [phone] Cell: [phone] After 3pm or if no response, call [phone]

## 2016-04-20 NOTE — Progress Notes (Signed)
Neb and vest not done due to family request pt to sleep. No distress noted at this time.

## 2016-04-20 NOTE — Consult Note (Signed)
[place] [phone]   [phone]  CONSULT NOTE  REFERRING PHYSICIAN: Dr. Phillips Climes.  REASON FOR CONSULTATION:  45 years old white male recently diagnosed with lung cancer  HPI Douglas Kelly is a 45 y.o. male with no significant past medical history except for electrocution year ago when he stepped on a power line. He also has a long history of smoking. The patient mentions that since May 2017 he has been complaining of cough and shortness of breath. He was seen at [facility] at that time. He was treated with antibiotics as well as inhaler. He felt better for a while but his symptoms started getting worse. He used over-the-counter medication for a while. He had around 25 pound weight loss in the last few months. He also has night sweats for several months and recently started having more fever and chills at the daytime too. He presented to the emergency department at [facility] for evaluation. Chest x-ray on 04/15/2016 showed near-complete whiteout of the left lung with volume loss and mediastinal shift to the left. CT angiogram of the chest was performed on the same day and it showed volume loss secondary to atelectasis and left lung collapse of the left upper lobe, lingula and left lower lobe. Soft tissue densities are noted abruptly terminating the distal left mainstem bronchus. An endoluminal luminal mass or mucus causing the left lung collapse is not excluded. There was also faint groundglass 7 mm and 5 mm nodular density in the right upper lobe. The patient was transferred to [facility] for evaluation and consideration of radiotherapy. He underwent bronchoscopy under the care of Dr. Lake Bells on 04/19/2016. There was an exophytic, friable and polypoid mass found in the left mainstem bronchus. The patient had an endobronchial biopsy as well as bronchial washing and brushing. The final cytology ([id number]) of the  left upper lobe bronchial brushing showed malignant cells consistent with non-small cell carcinoma, favoring adenocarcinoma. There was insufficient material for molecular testing. CT of the abdomen and pelvis was performed on 04/19/2016 and it showed no acute intra-abdominal pelvic pathology. No suspicious malignancy within the abdomen or pelvis. MRI of the brain was tried today but the patient was unable to complete the study. No large masses were seen. Dr. Waldron Labs kindly ask me to see the patient today for recommendation regarding treatment of his condition. When seen today the patient was accompanied by his girlfriend. He continues to complain of fatigue and weakness as well as shortness of breath and cough. He has no hemoptysis. His weight has been stable since his admission. He has no current fever or chills. He has no nausea or vomiting. Has occasional headache was no visual changes. Family history significant for a father who died from lung cancer at age 110. The patient is single and has 2 children. Used to climb and cut trees. He has a history of smoking 1 pack per day for around 26 years. He quit this admission. He has no history of alcohol or drug abuse.  HPI  Past Medical History:  Diagnosis Date  . Pneumonia     Past Surgical History:  Procedure Laterality Date  . VIDEO BRONCHOSCOPY Bilateral 04/19/2016   Procedure: VIDEO BRONCHOSCOPY WITHOUT FLUORO;  Surgeon: Juanito Doom, MD;  Location: [facility] ENDOSCOPY;  Service: Cardiopulmonary;  Laterality: Bilateral;    History reviewed. No pertinent family history.  Social History Social History  Substance Use Topics  . Smoking status:  Current Every Day Smoker    Packs/day: 1.00    Types: Cigarettes  . Smokeless tobacco: Never Used  . Alcohol use No    No Known Allergies  Current Facility-Administered Medications  Medication Dose Route Frequency Provider Last Rate Last Dose  . iopamidol (ISOVUE-300) 61 % injection             . 0.9 %  sodium chloride infusion  1,000 mL Intravenous Continuous Albertine Patricia, MD 100 mL/hr at 04/19/16 2202 1,000 mL at 04/19/16 2202  . albuterol (PROVENTIL) (2.5 MG/3ML) 0.083% nebulizer solution 2.5 mg  2.5 mg Nebulization Q4H PRN Albertine Patricia, MD   2.5 mg at 04/17/16 1141  . budesonide (PULMICORT) nebulizer solution 0.5 mg  0.5 mg Nebulization BID [place], MD   0.5 mg at 04/19/16 2012  . chlorpheniramine-HYDROcodone (TUSSIONEX) 10-8 MG/5ML suspension 5 mL  5 mL Oral Q12H PRN Albertine Patricia, MD      . cyanocobalamin ((VITAMIN B-12)) injection 1,000 mcg  1,000 mcg Intramuscular Daily Albertine Patricia, MD   1,000 mcg at 04/20/16 0830  . enoxaparin (LOVENOX) injection 40 mg  40 mg Subcutaneous Q24H Roney Jaffe, MD   40 mg at 04/20/16 0748  . feeding supplement (ENSURE ENLIVE) (ENSURE ENLIVE) liquid 237 mL  237 mL Oral BID BM Silver Huguenin Elgergawy, MD   237 mL at 04/20/16 1400  . gadobenate dimeglumine (MULTIHANCE) injection 15 mL  15 mL Intravenous Once PRN Albertine Patricia, MD      . guaiFENesin (MUCINEX) 12 hr tablet 1,200 mg  1,200 mg Oral BID Albertine Patricia, MD   1,200 mg at 04/20/16 0830  . ibuprofen (ADVIL,MOTRIN) tablet 400 mg  400 mg Oral Q6H PRN Albertine Patricia, MD   400 mg at 04/17/16 1048  . iopamidol (ISOVUE-300) 61 % injection 30 mL  30 mL Oral Once PRN Albertine Patricia, MD      . ipratropium-albuterol (DUONEB) 0.5-2.5 (3) MG/3ML nebulizer solution 3 mL  3 mL Nebulization TID Albertine Patricia, MD   3 mL at 04/20/16 1407  . morphine 2 MG/ML injection 2-4 mg  2-4 mg Intravenous Q4H PRN Albertine Patricia, MD   2 mg at 04/20/16 1936  . nicotine (NICODERM CQ - dosed in mg/24 hours) patch 21 mg  21 mg Transdermal Daily Albertine Patricia, MD   21 mg at 04/20/16 0831  . oxyCODONE-acetaminophen (PERCOCET/ROXICET) 5-325 MG per tablet 1-2 tablet  1-2 tablet Oral Q6H PRN Albertine Patricia, MD   2 tablet at 04/20/16 1212  . piperacillin-tazobactam  (ZOSYN) IVPB 3.375 g  3.375 g Intravenous Q8H Roney Jaffe, MD   3.375 g at 04/20/16 1340  . protein supplement (PREMIER PROTEIN) liquid  11 oz Oral BID BM Albertine Patricia, MD   11 oz at 04/20/16 1400  . sodium chloride 0.9 % injection             Review of Systems  Constitutional: positive for fatigue, night sweats and weight loss Eyes: negative Ears, nose, mouth, throat, and face: negative Respiratory: positive for cough and dyspnea on exertion Cardiovascular: negative Gastrointestinal: negative for constipation, diarrhea, nausea and vomiting Genitourinary:negative for dysuria, frequency, hematuria and hesitancy Integument/breast: negative for rash Hematologic/lymphatic: negative Musculoskeletal:negative Neurological: positive for headaches Behavioral/Psych: negative Endocrine: negative Allergic/Immunologic: negative  Physical Exam  IRC:VELFY, healthy, no distress, well developed, ill looking and malnourished SKIN: skin color, texture, turgor are normal, no rashes or significant lesions HEAD:  Normocephalic, No masses, lesions, tenderness or abnormalities EYES: normal, PERRLA, Conjunctiva are pink and non-injected EARS: External ears normal, Canals clear OROPHARYNX:no exudate, no erythema and lips, buccal mucosa, and tongue normal  NECK: supple, no adenopathy, no JVD LYMPH:  no palpable lymphadenopathy, no hepatosplenomegaly LUNGS: clear to auscultation , and palpation on the right, decreased breath sounds and dullness to percussion on the left. HEART: regular rate & rhythm, no murmurs and no gallops ABDOMEN:abdomen soft, non-tender, normal bowel sounds and no masses or organomegaly BACK: Back symmetric, no curvature., No CVA tenderness EXTREMITIES:no joint deformities, effusion, or inflammation, no edema, no skin discoloration  NEURO: alert & oriented x 3 with fluent speech, no focal motor/sensory deficits  PERFORMANCE STATUS: ECOG 1  LABORATORY DATA: Lab Results    Component Value Date   WBC 13.3 (H) 04/20/2016   HGB 10.2 (L) 04/20/2016   HCT 31.7 (L) 04/20/2016   MCV 93.0 04/20/2016   PLT 679 (H) 04/20/2016    '@LASTCHEM'$ @  RADIOGRAPHIC STUDIES: Dg Chest 2 View  Result Date: 04/18/2016 CLINICAL DATA:  April 15, 2016 EXAM: CHEST  2 VIEW COMPARISON:  None. FINDINGS: Near complete collapse of left lung with minimal aeration in the left apex, similar in the interval. Abrupt cut off of the left mainstem bronchus, as appreciated on recent CT. The heart mediastinum are shifted into the left chest. Hyperexpansion of the right lung is compensatory. The right lung is otherwise clear. No other interval changes. IMPRESSION: Near complete collapse left lung with abrupt cut off of left mainstem bronchus, similar in the interval. Electronically Signed   By: Dorise Bullion III M.D   On: 04/18/2016 09:05   Dg Chest 2 View  Result Date: 04/15/2016 CLINICAL DATA:  Left-sided chest pain and dyspnea x5 months. History of pneumonia 7 months ago that filled entire left lung. EXAM: CHEST  2 VIEW COMPARISON:  11/03/2012 FINDINGS: There is volume loss with tracheal deviation to the left and near complete whiteout of the left hemithorax. Compensatory hyperinflation of the right lung. Only a few aerated portions of left upper lobe are noted near the apex. The entire heart is obscured as is the aorta. No suspicious osseous abnormality. IMPRESSION: Near complete whiteout of the left lung with volume loss and mediastinal shift to the left. Compensatory hyperinflation of the normal-appearing right lung. Electronically Signed   By: Ashley Royalty M.D.   On: 04/15/2016 16:19   Ct Angio Chest Pe W And/or Wo Contrast  Result Date: 04/15/2016 CLINICAL DATA:  Cough for 5 months with dyspnea and weight loss. Left-sided chest pain. Current smoker. EXAM: CT ANGIOGRAPHY CHEST WITH CONTRAST TECHNIQUE: Multidetector CT imaging of the chest was performed using the standard protocol during bolus  administration of intravenous contrast. Multiplanar CT image reconstructions and MIPs were obtained to evaluate the vascular anatomy. CONTRAST:  100 cc of Isovue 370 COMPARISON:  Same day CXR FINDINGS: Cardiovascular: The heart shifted toward the left hemithorax secondary to left lung collapse and volume loss. No acute pulmonary embolus. No aortic aneurysm or dissection. No pericardial effusion. Mediastinum/Nodes: There is prevascular lymphadenopathy measuring up to 12 mm short axis. No paratracheal, supraclavicular nor axillary lymphadenopathy. Lungs/Pleura: There is volume loss secondary to atelectasis and left lung collapse of the left upper lobe, lingula and left lower lobe. Tubular branching air like opacities are seen within the left lower consistent with air within bronchi and bronchioles. No confluent areas of cavitation noted to suggest pulmonary necrosis on current exam. Soft tissue densities are noted  abruptly terminating the distal left main stem bronchus. An endoluminal mass or mucous causing the left lung collapse is not excluded. Faint ground-glass 7 mm in average and 5 mm in average nodular densities in the right upper lobe are noted, series 4 image 48 and 49. No effusion or pneumothorax on the right. Upper Abdomen: No acute abnormality within the abdomen. No enhancing hepatic mass. No definite adrenal nodule to the extent that they are included. Musculoskeletal: Nonacute Review of the MIP images confirms the above findings. IMPRESSION: 1. Left lung atelectasis/collapse with volume loss and shift of the mediastinum to the left. Soft tissue densities in the distal mainstem bronchus may be secondary to is inspissation. Endobronchial mass or lesion is not entirely excluded. No definite evidence of pulmonary necrosis. Mild prevascular lymphadenopathy. 2. No acute pulmonary embolus. 3. 7 mm and 5 mm right upper lobe ground-glass and nodular opacities. Initial follow-up with CT at 6-12 months is  recommended to confirm persistence. If persistent, repeat CT is recommended every 2 years until 5 years of stability has been established. This recommendation follows the consensus statement: Guidelines for Management of Incidental Pulmonary Nodules Detected on CT Images: From the Fleischner Society 2017; Radiology 2017; 284:228-243. Electronically Signed   By: Ashley Royalty M.D.   On: 04/15/2016 18:17   Mr Brain Wo Contrast  Result Date: 04/20/2016 CLINICAL DATA:  Lung mass.  Rule out metastatic disease EXAM: MRI HEAD WITHOUT CONTRAST TECHNIQUE: Multiplanar, multiecho pulse sequences of the brain and surrounding structures were obtained without intravenous contrast. COMPARISON:  CT head 11/03/2012 FINDINGS: Incomplete study. Sagittal T1 and axial diffusion only were obtained. The patient refused any further imaging. Ventricle size normal. Negative for acute infarct. No large mass lesion identified. Pituitary not enlarged. Calvarium is intact right IMPRESSION: Incomplete study. There is not adequate information to rule out metastatic disease. No large mass identified. If the patient refuses further MRI scanning with sedation, CT head without with contrast is suggested for staging. Electronically Signed   By: Franchot Gallo M.D.   On: 04/20/2016 06:59   Ct Abdomen Pelvis W Contrast  Result Date: 04/19/2016 CLINICAL DATA:  45 year old male presents with cough weight loss and poor appetite. EXAM: CT ABDOMEN AND PELVIS WITH CONTRAST TECHNIQUE: Multidetector CT imaging of the abdomen and pelvis was performed using the standard protocol following bolus administration of intravenous contrast. CONTRAST:  167m ISOVUE-300 IOPAMIDOL (ISOVUE-300) INJECTION 61% COMPARISON:  Chest CT dated 04/15/2016 FINDINGS: Lower chest: There is complete consolidative changes of the left lung base as seen on the prior CT with associated volume loss and compensatory expansion of the right lung. There is shift of the mediastinum to the  to the left hemithorax as seen on the prior CT. These findings are better evaluated on the chest CT of 04/15/2016. Correlation with clinical exam and follow-up to resolution recommended. The visualized right lung base is clear. There is no intra-abdominal free air.  No free fluid noted. Hepatobiliary: No focal liver abnormality is seen. No gallstones, gallbladder wall thickening, or biliary dilatation. Pancreas: Unremarkable. No pancreatic ductal dilatation or surrounding inflammatory changes. Spleen: Normal in size without focal abnormality. Adrenals/Urinary Tract: Adrenal glands are unremarkable. Kidneys are normal, without renal calculi, focal lesion, or hydronephrosis. Bladder is unremarkable. Stomach/Bowel: There is moderate amount of stool throughout the colon. There is no evidence of bowel obstruction or active inflammation. Normal appendix. Vascular/Lymphatic: No significant vascular findings are present. No enlarged abdominal or pelvic lymph nodes. Reproductive: The prostate and seminal vesicles are  grossly unremarkable. Other: There is mild diffuse subcutaneous edema. No fluid collection. Musculoskeletal: There is diffuse disc bulge with disc desiccation and vacuum phenomena at L5-S1. Mild chronic bilateral sacroiliitis. The osseous structures are otherwise intact. IMPRESSION: No acute intra-abdominopelvic pathology. No findings suspicious of malignancy within the abdomen or pelvis. Complete consolidative changes of the visualized left lung base with volume loss and shift of the mediastinum into the left hemithorax as seen on the prior CT. Correlation with clinical exam and follow-up to resolution is recommended. Electronically Signed   By: Anner Crete M.D.   On: 04/19/2016 21:53   US Abdomen Limited Ruq  Result Date: 04/18/2016 CLINICAL DATA:  Elevated LFTs. EXAM: US ABDOMEN LIMITED - RIGHT UPPER QUADRANT COMPARISON:  None. FINDINGS: Gallbladder: No gallstones or wall thickening visualized. No  sonographic Murphy sign noted by sonographer. Two small nonshadowing and non mobile echogenic foci along the gallbladder wall near the gallbladder neck with the largest measuring 4 mm likely polyps. Common bile duct: Diameter: 1.7 mm. Liver: No focal lesion identified. Within normal limits in parenchymal echogenicity. IMPRESSION: No acute hepatobiliary disease. Two probable small polyps near the gallbladder base with the larger measuring 4 mm. Electronically Signed   By: Marin Olp M.D.   On: 04/18/2016 14:40    ASSESSMENT: This is a very pleasant 45 years old white male recently diagnosed with likely stage IV non-small cell lung cancer, adenocarcinoma with central obstructing left mainstem lung mass and collapse of the left lung. Unfortunately there is insufficient material for molecular studies.   PLAN: I had a lengthy discussion with the patient and his girlfriend about his current disease stage, prognosis and treatment options I explained to the patient that he has incurable condition and all the treatment will be off palliative nature. I discussed with the patient consideration of repeating another bronchoscopy to get more tissue for molecular studies especially with his family history of lung cancer at early age. If there is any concern about repeating the bronchoscopy and biopsy, I would consider sending a blood test to [facility] for molecular studies on outpatient basis but this will not be helpful for PDL 1 expression. I also recommended for the patient to complete the staging workup by undergoing CT scan of the head with and without contrast to rule out brain metastasis since he did not tolerate the MRI of the brain. The patient would also need a PET scan on outpatient basis for further staging of his disease and to identify the primary lesion of his lung cancer. I will also consult radiation oncology for consideration of palliative radiotherapy to the central obstructing left lung  mass. Once the molecular studies are available, I would discuss with the patient his treatment options including palliative care versus systemic chemotherapy versus treatment with targeted therapy or immunotherapy depending on his final molecular studies. For smoke cessation, I strongly encouraged the patient to quit smoking. I will arrange for the patient a follow-up appointment with me at the [place] after discharge for more detailed discussion of his treatment options.  The patient voices understanding of current disease status and treatment options and is in agreement with the current care plan.  All questions were answered. The patient knows to call the clinic with any problems, questions or concerns. We can certainly see the patient much sooner if necessary.  Thank you so much for allowing me to participate in the care of Douglas Kelly. I will continue to follow up the patient with you  and assist in his care.  Disclaimer: This note was dictated with voice recognition software. Similar sounding words can inadvertently be transcribed and may not be corrected upon review.   Nadalie Laughner K. April 20, 2016, 7:57 PM

## 2016-04-21 ENCOUNTER — Other Ambulatory Visit: Payer: Self-pay | Admitting: Radiation Oncology

## 2016-04-21 ENCOUNTER — Ambulatory Visit: Payer: Medicaid Other | Admitting: Radiation Oncology

## 2016-04-21 ENCOUNTER — Ambulatory Visit
Admission: RE | Admit: 2016-04-21 | Discharge: 2016-04-21 | Disposition: A | Discharge status: Home / Self Care | Payer: Medicaid Other | Source: Ambulatory Visit | Attending: Radiation Oncology | Admitting: Radiation Oncology

## 2016-04-21 ENCOUNTER — Inpatient Hospital Stay (HOSPITAL_COMMUNITY): Payer: Medicaid Other

## 2016-04-21 DIAGNOSIS — Z510 Encounter for antineoplastic radiation therapy: Secondary | ICD-10-CM | POA: Insufficient documentation

## 2016-04-21 DIAGNOSIS — C3492 Malignant neoplasm of unspecified part of left bronchus or lung: Secondary | ICD-10-CM | POA: Insufficient documentation

## 2016-04-21 DIAGNOSIS — E43 Unspecified severe protein-calorie malnutrition: Secondary | ICD-10-CM

## 2016-04-21 LAB — BASIC METABOLIC PANEL
Anion gap: 8 (ref 5–15)
BUN: 12 mg/dL (ref 6–20)
CHLORIDE: 101 mmol/L (ref 101–111)
CO2: 27 mmol/L (ref 22–32)
CREATININE: 0.75 mg/dL (ref 0.61–1.24)
Calcium: 8.9 mg/dL (ref 8.9–10.3)
GFR calc Af Amer: >60 mL/min (ref >60)
GFR calc non Af Amer: >60 mL/min (ref >60)
Glucose, Bld: 110 mg/dL — ABNORMAL HIGH (ref 65–99)
Potassium: 4.8 mmol/L (ref 3.5–5.1)
SODIUM: 136 mmol/L (ref 135–145)

## 2016-04-21 LAB — CBC
HCT: 32.1 % — ABNORMAL LOW (ref 39.0–52.0)
HEMOGLOBIN: 10.3 g/dL — AB (ref 13.0–17.0)
MCH: 29.9 pg (ref 26.0–34.0)
MCHC: 32.1 g/dL (ref 30.0–36.0)
MCV: 93.3 fL (ref 78.0–100.0)
PLATELETS: 669 10*3/uL — AB (ref 150–400)
RBC: 3.44 MIL/uL — ABNORMAL LOW (ref 4.22–5.81)
RDW: 13.8 % (ref 11.5–15.5)
WBC: 12.1 10*3/uL — ABNORMAL HIGH (ref 4.0–10.5)

## 2016-04-21 LAB — CULTURE, BLOOD (ROUTINE X 2)
CULTURE: NO GROWTH
Culture: NO GROWTH

## 2016-04-21 LAB — MRSA PCR SCREENING: MRSA BY PCR: NEGATIVE

## 2016-04-21 MED ORDER — IOPAMIDOL (ISOVUE-300) INJECTION 61%
INTRAVENOUS | Status: AC
  Administered 2016-04-21: 75 mL
  Filled 2016-04-21: qty 75

## 2016-04-21 MED ORDER — SODIUM CHLORIDE 0.9 % IJ SOLN
INTRAMUSCULAR | Status: AC
  Administered 2016-04-21: 09:00:00
  Filled 2016-04-21: qty 50

## 2016-04-21 NOTE — Consult Note (Signed)
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 male who is   since May 2017 he has been complaining of cough and shortness of breath. He was seen at [facility] at [place] at that time. He was treated with antibiotics as well as inhaler. He felt better for a while but his symptoms started getting worse. He used over-the-counter medication for a while. He had around 25 pound weight loss in the last few months. He also has night sweats for several months and recently started having more fever and chills at the daytime too. He presented to the emergency department at [facility] for evaluation. Chest x-ray on 04/15/2016 showed near-complete whiteout of the left lung with volume loss and mediastinal shift to the left.     CT angiogram of the chest was performed on the same day and it showed volume loss secondary to atelectasis and left lung collapse of the left upper lobe, lingula and left lower lobe. Soft tissue densities are noted abruptly terminating the distal left mainstem bronchus. An endoluminal luminal mass or mucus causing the left lung collapse is not excluded. There was also faint groundglass 7 mm and 5 mm nodular density in the right upper lobe.    The patient was transferred to [facility] for evaluation and consideration of radiotherapy. He underwent bronchoscopy under the care of Dr. Lake Bells on 04/19/2016. There was an exophytic, friable and polypoid mass found in the left mainstem bronchus. The patient had an endobronchial biopsy as well as bronchial washing and brushing. The final cytology ([id number]) of the left upper lobe  bronchial brushing showed malignant cells consistent with non-small cell carcinoma, favoring adenocarcinoma. There was insufficient material for molecular testing. CT of the abdomen and pelvis was performed on 04/19/2016 and it showed no acute intra-abdominal pelvic pathology. No suspicious malignancy within the abdomen or pelvis. MRI of the brain was tried today but the patient was unable to complete the study. No large masses were seen. Dr. Waldron Labs kindly ask me to see the patient today for recommendation regarding treatment of his condition.Marland Kitchen  PREVIOUS RADIATION THERAPY: No  Past Medical History:  Diagnosis Date  . Pneumonia   :  Past Surgical History:  Procedure Laterality Date  . VIDEO BRONCHOSCOPY Bilateral 04/19/2016   Procedure: VIDEO BRONCHOSCOPY WITHOUT FLUORO;  Surgeon: Juanito Doom, MD;  Location: [facility] ENDOSCOPY;  Service: Cardiopulmonary;  Laterality: Bilateral;  :   Current Facility-Administered Medications:  .  albuterol (PROVENTIL) (2.5 MG/3ML) 0.083% nebulizer solution 2.5 mg, 2.5 mg, Nebulization, Q4H PRN, Albertine Patricia, MD, 2.5 mg at 04/17/16 1141 .  budesonide (PULMICORT) nebulizer solution 0.5 mg, 0.5 mg, Nebulization, BID, Jose Angelo A Vader, MD, 0.5 mg at 04/20/16 2135 .  chlorpheniramine-HYDROcodone (TUSSIONEX) 10-8 MG/5ML suspension 5 mL, 5 mL, Oral, Q12H PRN, Silver Huguenin Elgergawy, MD .  enoxaparin (LOVENOX) injection 40 mg, 40 mg, Subcutaneous, Q24H, Roney Jaffe, MD, 40 mg at 04/21/16 0941 .  feeding supplement (ENSURE ENLIVE) (ENSURE ENLIVE) liquid 237 mL, 237 mL, Oral, BID BM, Albertine Patricia, MD, 237 mL at 04/21/16 0942 .  gadobenate dimeglumine (MULTIHANCE) injection 15  mL, 15 mL, Intravenous, Once PRN, Albertine Patricia, MD .  guaiFENesin (MUCINEX) 12 hr tablet 1,200 mg, 1,200 mg, Oral, BID, Albertine Patricia, MD, 1,200 mg at 04/21/16 0940 .  ibuprofen (ADVIL,MOTRIN) tablet 400 mg, 400 mg, Oral, Q6H PRN, Albertine Patricia, MD, 400 mg at  04/17/16 1048 .  iopamidol (ISOVUE-300) 61 % injection 30 mL, 30 mL, Oral, Once PRN, Albertine Patricia, MD .  ipratropium-albuterol (DUONEB) 0.5-2.5 (3) MG/3ML nebulizer solution 3 mL, 3 mL, Nebulization, TID, Albertine Patricia, MD, 3 mL at 04/20/16 2135 .  morphine 2 MG/ML injection 2-4 mg, 2-4 mg, Intravenous, Q4H PRN, Albertine Patricia, MD, 4 mg at 04/21/16 1304 .  nicotine (NICODERM CQ - dosed in mg/24 hours) patch 21 mg, 21 mg, Transdermal, Daily, Albertine Patricia, MD, 21 mg at 04/21/16 0941 .  oxyCODONE-acetaminophen (PERCOCET/ROXICET) 5-325 MG per tablet 1-2 tablet, 1-2 tablet, Oral, Q6H PRN, Albertine Patricia, MD, 2 tablet at 04/21/16 0940 .  piperacillin-tazobactam (ZOSYN) IVPB 3.375 g, 3.375 g, Intravenous, Q8H, Roney Jaffe, MD, 3.375 g at 04/21/16 0445 .  protein supplement (PREMIER PROTEIN) liquid, 11 oz, Oral, BID BM, Albertine Patricia, MD, 11 oz at 04/21/16 0941:  No Known Allergies:  History reviewed. No pertinent family history.:  Social History   Social History  . Marital status: Single    Spouse name: N/A  . Number of children: N/A  . Years of education: N/A   Occupational History  . Not on file.   Social History Main Topics  . Smoking status: Current Every Day Smoker    Packs/day: 1.00    Types: Cigarettes  . Smokeless tobacco: Never Used  . Alcohol use No  . Drug use: No  . Sexual activity: No   Other Topics Concern  . Not on file   Social History Narrative  . No narrative on file  :  REVIEW OF SYSTEMS:  A 15 point review of systems is documented in the electronic medical record. This was obtained by the nursing staff. However, I reviewed this with the patient to discuss relevant findings and make appropriate changes.  Pertinent items are noted in HPI. Pertinent items noted in HPI and remainder of comprehensive ROS otherwise negative.   PHYSICAL EXAM:  Blood pressure (!) 101/55, pulse 86, temperature 98 F (36.7 C), temperature source Oral,  resp. rate 18, height '5\' 8"'$  (1.727 m), weight 150 lb 2.1 oz (68.1 kg), SpO2 96 %. Per med-onc - BPZ:WCHEN, healthy, no distress, well developed, ill looking and malnourished SKIN: skin color, texture, turgor are normal, no rashes or significant lesions HEAD: Normocephalic, No masses, lesions, tenderness or abnormalities EYES: normal, PERRLA, Conjunctiva are pink and non-injected EARS: External ears normal, Canals clear OROPHARYNX:no exudate, no erythema and lips, buccal mucosa, and tongue normal  NECK: supple, no adenopathy, no JVD LYMPH:  no palpable lymphadenopathy, no hepatosplenomegaly LUNGS: clear to auscultation , and palpation on the right, decreased breath sounds and dullness to percussion on the left. HEART: regular rate & rhythm, no murmurs and no gallops ABDOMEN:abdomen soft, non-tender, normal bowel sounds and no masses or organomegaly BACK: Back symmetric, no curvature., No CVA tenderness EXTREMITIES:no joint deformities, effusion, or inflammation, no edema, no skin discoloration  NEURO: alert & oriented x 3 with fluent speech, no focal motor/sensory deficits  KPS = 80  100 - Normal; no complaints; no evidence of disease. 90   - Able to carry on normal activity; minor signs or symptoms of disease. 80   -  Normal activity with effort; some signs or symptoms of disease. 2   - Cares for self; unable to carry on normal activity or to do active work. 60   - Requires occasional assistance, but is able to care for most of his personal needs. 50   - Requires considerable assistance and frequent medical care. 90   - Disabled; requires special care and assistance. 63   - Severely disabled; hospital admission is indicated although death not imminent. 63   - Very sick; hospital admission necessary; active supportive treatment necessary. 10   - Moribund; fatal processes progressing rapidly. 0     - Dead  Karnofsky DA, Abelmann [place], Craver LS and Burchenal [facility] [phone]) The use of the  nitrogen mustards in the palliative treatment of carcinoma: with particular reference to bronchogenic carcinoma Cancer 1 634-56  LABORATORY DATA:  Lab Results  Component Value Date   WBC 12.1 (H) 04/21/2016   HGB 10.3 (L) 04/21/2016   HCT 32.1 (L) 04/21/2016   MCV 93.3 04/21/2016   PLT 669 (H) 04/21/2016   Lab Results  Component Value Date   NA 136 04/21/2016   K 4.8 04/21/2016   CL 101 04/21/2016   CO2 27 04/21/2016   Lab Results  Component Value Date   ALT 117 (H) 04/20/2016   AST 41 04/20/2016   ALKPHOS 107 04/20/2016   BILITOT 0.6 04/20/2016     RADIOGRAPHY: Dg Chest 2 View  Result Date: 04/18/2016 CLINICAL DATA:  April 15, 2016 EXAM: CHEST  2 VIEW COMPARISON:  None. FINDINGS: Near complete collapse of left lung with minimal aeration in the left apex, similar in the interval. Abrupt cut off of the left mainstem bronchus, as appreciated on recent CT. The heart mediastinum are shifted into the left chest. Hyperexpansion of the right lung is compensatory. The right lung is otherwise clear. No other interval changes. IMPRESSION: Near complete collapse left lung with abrupt cut off of left mainstem bronchus, similar in the interval. Electronically Signed   By: Dorise Bullion III M.D   On: 04/18/2016 09:05   Dg Chest 2 View  Result Date: 04/15/2016 CLINICAL DATA:  Left-sided chest pain and dyspnea x5 months. History of pneumonia 7 months ago that filled entire left lung. EXAM: CHEST  2 VIEW COMPARISON:  11/03/2012 FINDINGS: There is volume loss with tracheal deviation to the left and near complete whiteout of the left hemithorax. Compensatory hyperinflation of the right lung. Only a few aerated portions of left upper lobe are noted near the apex. The entire heart is obscured as is the aorta. No suspicious osseous abnormality. IMPRESSION: Near complete whiteout of the left lung with volume loss and mediastinal shift to the left. Compensatory hyperinflation of the normal-appearing  right lung. Electronically Signed   By: Ashley Royalty M.D.   On: 04/15/2016 16:19   Ct Head W & Wo Contrast  Result Date: 04/21/2016 CLINICAL DATA:  45 year old male recently diagnosed with left lung mass, suspected stage IV non-small cell lung cancer. Unable to tolerate MRI. Staging. Subsequent encounter. EXAM: CT HEAD WITHOUT AND WITH CONTRAST TECHNIQUE: Contiguous axial images were obtained from the base of the skull through the vertex without and with intravenous contrast CONTRAST:  75 mL ISOVUE-300 IOPAMIDOL (ISOVUE-300) INJECTION 61% COMPARISON:  Limited brain MRI 04/20/2016. Noncontrast head CT 11/03/2012. FINDINGS: Brain: No midline shift, ventriculomegaly, mass effect, evidence of mass lesion, intracranial hemorrhage or evidence of cortically based acute infarction. Gray-white matter differentiation is within normal limits throughout the brain.  No abnormal enhancement identified. Vascular: Calcified atherosclerosis at the skull base. Major intracranial vascular structures are enhancing. Skull: No acute or suspicious osseous lesion. Sinuses/Orbits: Previous left mastoidectomy appears stable. Trace right mastoid effusion is stable. Visible paranasal sinuses remain well pneumatized. Other: Visualized orbits and scalp soft tissues are within normal limits. IMPRESSION: Normal CT appearance of the brain. No metastatic disease identified. Electronically Signed   By: Genevie Ann M.D.   On: 04/21/2016 09:58   Ct Angio Chest Pe W And/or Wo Contrast  Result Date: 04/15/2016 CLINICAL DATA:  Cough for 5 months with dyspnea and weight loss. Left-sided chest pain. Current smoker. EXAM: CT ANGIOGRAPHY CHEST WITH CONTRAST TECHNIQUE: Multidetector CT imaging of the chest was performed using the standard protocol during bolus administration of intravenous contrast. Multiplanar CT image reconstructions and MIPs were obtained to evaluate the vascular anatomy. CONTRAST:  100 cc of Isovue 370 COMPARISON:  Same day CXR  FINDINGS: Cardiovascular: The heart shifted toward the left hemithorax secondary to left lung collapse and volume loss. No acute pulmonary embolus. No aortic aneurysm or dissection. No pericardial effusion. Mediastinum/Nodes: There is prevascular lymphadenopathy measuring up to 12 mm short axis. No paratracheal, supraclavicular nor axillary lymphadenopathy. Lungs/Pleura: There is volume loss secondary to atelectasis and left lung collapse of the left upper lobe, lingula and left lower lobe. Tubular branching air like opacities are seen within the left lower consistent with air within bronchi and bronchioles. No confluent areas of cavitation noted to suggest pulmonary necrosis on current exam. Soft tissue densities are noted abruptly terminating the distal left main stem bronchus. An endoluminal mass or mucous causing the left lung collapse is not excluded. Faint ground-glass 7 mm in average and 5 mm in average nodular densities in the right upper lobe are noted, series 4 image 48 and 49. No effusion or pneumothorax on the right. Upper Abdomen: No acute abnormality within the abdomen. No enhancing hepatic mass. No definite adrenal nodule to the extent that they are included. Musculoskeletal: Nonacute Review of the MIP images confirms the above findings. IMPRESSION: 1. Left lung atelectasis/collapse with volume loss and shift of the mediastinum to the left. Soft tissue densities in the distal mainstem bronchus may be secondary to is inspissation. Endobronchial mass or lesion is not entirely excluded. No definite evidence of pulmonary necrosis. Mild prevascular lymphadenopathy. 2. No acute pulmonary embolus. 3. 7 mm and 5 mm right upper lobe ground-glass and nodular opacities. Initial follow-up with CT at 6-12 months is recommended to confirm persistence. If persistent, repeat CT is recommended every 2 years until 5 years of stability has been established. This recommendation follows the consensus statement: Guidelines  for Management of Incidental Pulmonary Nodules Detected on CT Images: From the Fleischner Society 2017; Radiology 2017; 284:228-243. Electronically Signed   By: Ashley Royalty M.D.   On: 04/15/2016 18:17   Mr Brain Wo Contrast  Result Date: 04/20/2016 CLINICAL DATA:  Lung mass.  Rule out metastatic disease EXAM: MRI HEAD WITHOUT CONTRAST TECHNIQUE: Multiplanar, multiecho pulse sequences of the brain and surrounding structures were obtained without intravenous contrast. COMPARISON:  CT head 11/03/2012 FINDINGS: Incomplete study. Sagittal T1 and axial diffusion only were obtained. The patient refused any further imaging. Ventricle size normal. Negative for acute infarct. No large mass lesion identified. Pituitary not enlarged. Calvarium is intact right IMPRESSION: Incomplete study. There is not adequate information to rule out metastatic disease. No large mass identified. If the patient refuses further MRI scanning with sedation, CT head without with contrast  is suggested for staging. Electronically Signed   By: Franchot Gallo M.D.   On: 04/20/2016 06:59   Ct Abdomen Pelvis W Contrast  Result Date: 04/19/2016 CLINICAL DATA:  45 year old male presents with cough weight loss and poor appetite. EXAM: CT ABDOMEN AND PELVIS WITH CONTRAST TECHNIQUE: Multidetector CT imaging of the abdomen and pelvis was performed using the standard protocol following bolus administration of intravenous contrast. CONTRAST:  166m ISOVUE-300 IOPAMIDOL (ISOVUE-300) INJECTION 61% COMPARISON:  Chest CT dated 04/15/2016 FINDINGS: Lower chest: There is complete consolidative changes of the left lung base as seen on the prior CT with associated volume loss and compensatory expansion of the right lung. There is shift of the mediastinum to the to the left hemithorax as seen on the prior CT. These findings are better evaluated on the chest CT of 04/15/2016. Correlation with clinical exam and follow-up to resolution recommended. The visualized  right lung base is clear. There is no intra-abdominal free air.  No free fluid noted. Hepatobiliary: No focal liver abnormality is seen. No gallstones, gallbladder wall thickening, or biliary dilatation. Pancreas: Unremarkable. No pancreatic ductal dilatation or surrounding inflammatory changes. Spleen: Normal in size without focal abnormality. Adrenals/Urinary Tract: Adrenal glands are unremarkable. Kidneys are normal, without renal calculi, focal lesion, or hydronephrosis. Bladder is unremarkable. Stomach/Bowel: There is moderate amount of stool throughout the colon. There is no evidence of bowel obstruction or active inflammation. Normal appendix. Vascular/Lymphatic: No significant vascular findings are present. No enlarged abdominal or pelvic lymph nodes. Reproductive: The prostate and seminal vesicles are grossly unremarkable. Other: There is mild diffuse subcutaneous edema. No fluid collection. Musculoskeletal: There is diffuse disc bulge with disc desiccation and vacuum phenomena at L5-S1. Mild chronic bilateral sacroiliitis. The osseous structures are otherwise intact. IMPRESSION: No acute intra-abdominopelvic pathology. No findings suspicious of malignancy within the abdomen or pelvis. Complete consolidative changes of the visualized left lung base with volume loss and shift of the mediastinum into the left hemithorax as seen on the prior CT. Correlation with clinical exam and follow-up to resolution is recommended. Electronically Signed   By: AAnner CreteM.D.   On: 04/19/2016 21:53   [place]domen Limited Ruq  Result Date: 04/18/2016 CLINICAL DATA:  Elevated LFTs. EXAM: [place]DOMEN LIMITED - RIGHT UPPER QUADRANT COMPARISON:  None. FINDINGS: Gallbladder: No gallstones or wall thickening visualized. No sonographic Murphy sign noted by sonographer. Two small nonshadowing and non mobile echogenic foci along the gallbladder wall near the gallbladder neck with the largest measuring 4 mm likely polyps.  Common bile duct: Diameter: 1.7 mm. Liver: No focal lesion identified. Within normal limits in parenchymal echogenicity. IMPRESSION: No acute hepatobiliary disease. Two probable small polyps near the gallbladder base with the larger measuring 4 mm. Electronically Signed   By: DMarin OlpM.D.   On: 04/18/2016 14:40      IMPRESSION:  45yo man with stage T3 Nx Mx adenocarcinoma of the left lung - pending PET scan.  Despite left lung collapse, the patient is currently hemodynamically stable with good O2 saturations on room air.  Agree with Dr. MJulien Nordmannthat repeat bronch for more tissue for molecular testing would be appropriate.  Following discharge, patient would benefit from PET scan.  Patient may benefit from radiotherapy to the left mainstem bronchus site to help alleviate airway obstruction, and potentially provide local control for palliation.  PLAN:Today, I talked to the patient and family about the findings and work-up thus far.  We discussed the natural history of locally advanced  non-small cell lung cancer and general treatment, highlighting the role of radiotherapy in the management.  We discussed the available radiation techniques, and focused on the details of logistics and delivery.  We reviewed the anticipated acute and late sequelae associated with radiation in this setting.  The patient was encouraged to ask questions that I answered to the best of my ability.  I filled out a patient counseling form during our discussion including treatment diagrams.  We retained a copy for our records.  The patient would like to proceed with radiation and will be scheduled for CT simulation later today.  I spent 30 minutes minutes face to face with the patient and more than 50% of that time was spent in counseling and/or coordination of care.      ------------------------------------------------   [name], MD [place] Director and Director of Stereotactic  Radiosurgery Direct Dial: [phone]  Fax: [phone] [email or web address]  Skype  LinkedIn

## 2016-04-21 NOTE — Progress Notes (Signed)
PROGRESS NOTE  Douglas Kelly  GEX:[id number] DOB: 07-24-1970 DOA: 04/15/2016 PCP: No primary care provider on file.  Brief Narrative:  45 y.o.malewith no sig PMHx, on no medications presents with of cough, sweats, wt loss and poor appetite. CXR showed complete white out of the LEFT chest with mediastinal L shift c/w some lung collapse. CT chest showed LEFT sided lobar collapse with L shift, air bronchograms.  Pulmonology was consulted and patient underwent upper endoscopy on 12/11. A large malignant mass was identified however his biopsy was benign. Brushing confirmed non-small cell lung cancer, likely adenocarcinoma, however not enough tissue was obtained for additional testing. Patient has been transferred to the intensive care unit on 12/13 for repeat bronchoscopy and biopsy to be done under anesthesia while patient intubated on ventilator. Patient met with radiation oncology and is initiating radiation treatments.  Assessment & Plan:   Principal Problem:   CAP (community acquired pneumonia) Active Problems:   Cough   Fever   Leukocytosis   Loss of weight   Normocytic anemia   LFTs abnormal   Smoker   Collapse of left lung, hx of pneumonia 5 months ago   Pneumonia of left upper lobe due to infectious organism (HCC)   Protein-calorie malnutrition, severe   Lung mass   Adenocarcinoma of left lung, stage 4 (HCC)   Sepsis due to Postobstructive Pneumonia due to non-small cell lung cancer, patient presented with fever, tachycardia, tachypnea, elevated lactic acid and leukocytosis, sepsis criteria met on admission.  Initially placed on broad-spectrum antibiotics vancomycin and Zosyn. - This is secondary to pneumonia with left lung collapse, follow on cultures and sputum cultures. - Stopped IV vancomycin 12/11 -  Continue Zosyn for 7 days - Repeat biopsy on 12/14, Dr. Lake Bells to perform in ICU while on ventilator - CT abdomen and pelvis 10 for staging with no evidence of metastasis -   CT head with and without contrast did not demonstrate brain metastases. - Continue chest PT including flutter valve , vest and Mucinex.  Right lung nodules - Appreciate pulmonology assistance  Elevated LFTs - Most likely to sepsis, remained stable, negative hepatitis panel, no acute finding in right upper quadrant ultrasound  Tobacco abuse - Counseled, requesting a nicotine patch  Anemia and thrombus or ptosis, due to malignancy - Anemia of chronic illness, B12 level is borderline, active, will supplement for t 3 days, then was transitioned to oral  Severe protein calorie malnutrition - started on supplements  DVT prophylaxis:  Lovenox Code Status:  Full code Family Communication:  Patient and his wife Disposition Plan:  Pending repeat biopsy, clinical stability postextubation. Anticipate home on 04/23/2016 with close outpatient follow-up with oncology and ongoing radiation treatments. He will need an outpatient PET scan.   Consultants:   Radiation oncology contact mom with Dr. Tammi Klippel  Oncology, Dr. Julien Nordmann  Pulmonary and critical care, Dr. Lake Bells  Procedures:  12/11: Bronchoscopy with biopsy of mass  Antimicrobials:  Anti-infectives    Start     Dose/Rate Route Frequency Ordered Stop   04/16/16 0600  vancomycin (VANCOCIN) IVPB 1000 mg/200 mL premix  Status:  Discontinued     1,000 mg 200 mL/hr over 60 Minutes Intravenous Every 8 hours 04/16/16 0242 04/19/16 1513   04/15/16 2215  piperacillin-tazobactam (ZOSYN) IVPB 3.375 g     3.375 g 12.5 mL/hr over 240 Minutes Intravenous Every 8 hours 04/15/16 2210     04/15/16 1828  vancomycin (VANCOCIN) 500 MG powder    Comments:  Peel,  Adrienne   : cabinet override      04/15/16 1828 04/16/16 0644   04/15/16 1649  vancomycin (VANCOCIN) 500 MG powder    Comments:  Peel, Adrienne   : cabinet override      04/15/16 1649 04/15/16 1830   04/15/16 1649  vancomycin (VANCOCIN) 1-5 GM/200ML-% IVPB    Comments:  Peel, Adrienne    : cabinet override      04/15/16 1649 04/15/16 1830   04/15/16 1645  piperacillin-tazobactam (ZOSYN) IVPB 3.375 g     3.375 g 100 mL/hr over 30 Minutes Intravenous  Once 04/15/16 1638 04/15/16 1723   04/15/16 1645  vancomycin (VANCOCIN) 1,270 mg in sodium chloride 0.9 % 500 mL IVPB  Status:  Discontinued     20 mg/kg  63.5 kg 250 mL/hr over 120 Minutes Intravenous  Once 04/15/16 1638 04/16/16 0241           Subjective: States he is starting to feel better, breathing is a little easier now. He still has an irritating cough and he has cough productive of thick yellow-green sputum. Denies hemoptysis  Objective: Vitals:   04/20/16 2159 04/21/16 0502 04/21/16 1316 04/21/16 1458  BP: 132/66 118/69 (!) 101/55   Pulse: 98 72 86   Resp: 16 17 18    Temp: 97.2 F (36.2 C) 97.6 F (36.4 C) 98 F (36.7 C)   TempSrc: Oral Oral Oral   SpO2: 98% 96% 96% 97%  Weight:      Height:        Intake/Output Summary (Last 24 hours) at 04/21/16 1826 Last data filed at 04/21/16 1457  Gross per 24 hour  Intake             3430 ml  Output                0 ml  Net             3430 ml   Filed Weights   04/15/16 1557 04/15/16 2050  Weight: 63.5 kg (140 lb) 68.1 kg (150 lb 2.1 oz)    Examination:  General exam:  Adult Male.  No acute distress.  HEENT:  NCAT, MMM Respiratory system:  Diminished over the left lung fields to the midback. Breath sounds present but diminished at the apex on the left side. No wheezes, rales, rhonchi Cardiovascular system: Regular rate and rhythm, normal S1/S2. No murmurs, rubs, gallops or clicks.  Warm extremities Gastrointestinal system: Normal active bowel sounds, soft, nondistended, nontender. MSK:  Normal tone and bulk, no lower extremity edema Neuro:  Grossly intact    Data Reviewed: I have personally reviewed following labs and imaging studies  CBC:  Recent Labs Lab 04/15/16 1616 04/15/16 2252 04/17/16 0530 04/19/16 0520 04/20/16 0534  04/21/16 0530  WBC 23.2* 20.7* 17.9* 12.5* 13.3* 12.1*  NEUTROABS 20.0*  --   --   --   --   --   HGB 10.2* 9.2* 9.9* 10.1* 10.2* 10.3*  HCT 31.2* 27.8* 30.3* 31.7* 31.7* 32.1*  MCV 93.1 92.4 93.2 93.5 93.0 93.3  PLT 585* 541* 610* 677* 679* 062*   Basic Metabolic Panel:  Recent Labs Lab 04/17/16 0530 04/18/16 0525 04/19/16 0520 04/20/16 0534 04/21/16 0530  NA 137 136 137 135 136  K 4.1 4.2 4.8 4.2 4.8  CL 103 102 101 102 101  CO2 27 26 28 26 27   GLUCOSE 124* 126* 114* 142* 110*  BUN 7 8 10 12 12   CREATININE 0.71 0.63 0.72 0.67  0.75  CALCIUM 8.3* 8.5* 8.6* 8.4* 8.9   GFR: Estimated Creatinine Clearance: 110 mL/min (by C-G formula based on SCr of 0.75 mg/dL). Liver Function Tests:  Recent Labs Lab 04/15/16 1616 04/17/16 0530 04/18/16 0525 04/20/16 0534  AST 74* 81* 69* 41  ALT 139* 205* 204* 117*  ALKPHOS 124 147* 139* 107  BILITOT 0.4 0.3 0.4 0.6  PROT 6.6 6.1* 6.1* 6.4*  ALBUMIN 2.3* 2.2* 2.2* 2.4*   No results for input(s): LIPASE, AMYLASE in the last 168 hours. No results for input(s): AMMONIA in the last 168 hours. Coagulation Profile:  Recent Labs Lab 04/16/16 1421  INR 1.21   Cardiac Enzymes:  Recent Labs Lab 04/15/16 1616  TROPONINI <0.03   BNP (last 3 results) No results for input(s): PROBNP in the last 8760 hours. HbA1C: No results for input(s): HGBA1C in the last 72 hours. CBG: No results for input(s): GLUCAP in the last 168 hours. Lipid Profile: No results for input(s): CHOL, HDL, LDLCALC, TRIG, CHOLHDL, LDLDIRECT in the last 72 hours. Thyroid Function Tests: No results for input(s): TSH, T4TOTAL, FREET4, T3FREE, THYROIDAB in the last 72 hours. Anemia Panel: No results for input(s): VITAMINB12, FOLATE, FERRITIN, TIBC, IRON, RETICCTPCT in the last 72 hours. Urine analysis:    Component Value Date/Time   COLORURINE YELLOW 04/15/2016 1908   APPEARANCEUR CLEAR 04/15/2016 1908   LABSPEC >1.046 (H) 04/15/2016 1908   PHURINE 7.5  04/15/2016 1908   GLUCOSEU NEGATIVE 04/15/2016 1908   HGBUR NEGATIVE 04/15/2016 [place] NEGATIVE 04/15/2016 [place] NEGATIVE 04/15/2016 1908   PROTEINUR NEGATIVE 04/15/2016 1908   NITRITE NEGATIVE 04/15/2016 1908   LEUKOCYTESUR NEGATIVE 04/15/2016 1908   Sepsis Labs: @LABRCNTIP (procalcitonin:4,lacticidven:4)  ) Recent Results (from the past 240 hour(s))  Blood culture (routine x 2)     Status: None   Collection Time: 04/15/16  4:40 PM  Result Value Ref Range Status   Specimen Description BLOOD LEFT HAND  Final   Special Requests BOTTLES DRAWN AEROBIC AND ANAEROBIC 5CC EACH  Final   Culture   Final    NO GROWTH 5 DAYS Performed at [facility]    Report Status 04/20/2016 FINAL  Final  Blood culture (routine x 2)     Status: None   Collection Time: 04/15/16  4:55 PM  Result Value Ref Range Status   Specimen Description BLOOD RIGHT HAND  Final   Special Requests BOTTLES DRAWN AEROBIC AND ANAEROBIC 5CC EACH  Final   Culture   Final    NO GROWTH 5 DAYS Performed at [facility]    Report Status 04/20/2016 FINAL  Final  Urine culture     Status: None   Collection Time: 04/15/16  7:08 PM  Result Value Ref Range Status   Specimen Description URINE, RANDOM  Final   Special Requests NONE  Final   Culture NO GROWTH Performed at [facility]   Final   Report Status 04/17/2016 FINAL  Final  Culture, sputum-assessment     Status: None   Collection Time: 04/15/16 10:01 PM  Result Value Ref Range Status   Specimen Description SPUTUM  Final   Special Requests NONE  Final   Sputum evaluation   Final    MICROSCOPIC FINDINGS SUGGEST THAT THIS SPECIMEN IS NOT REPRESENTATIVE OF LOWER RESPIRATORY SECRETIONS. PLEASE RECOLLECT. NOTIFIED RN LISA PATRAM @[id number]  ZANDO,C    Report Status 04/16/2016 FINAL  Final  Culture, blood (routine x 2) Call MD if unable to obtain prior  to antibiotics being given     Status: None   Collection Time: 04/15/16 10:52 PM   Result Value Ref Range Status   Specimen Description BLOOD RIGHT ARM  Final   Special Requests BOTTLES DRAWN AEROBIC AND ANAEROBIC 6 CC  Final   Culture   Final    NO GROWTH 5 DAYS Performed at [facility]    Report Status 04/21/2016 FINAL  Final  Culture, blood (routine x 2) Call MD if unable to obtain prior to antibiotics being given     Status: None   Collection Time: 04/15/16 10:52 PM  Result Value Ref Range Status   Specimen Description BLOOD RIGHT HAND  Final   Special Requests BOTTLES DRAWN AEROBIC AND ANAEROBIC 6 CC  Final   Culture   Final    NO GROWTH 5 DAYS Performed at [facility]    Report Status 04/21/2016 FINAL  Final  Culture, expectorated sputum-assessment     Status: None   Collection Time: 04/17/16  5:58 AM  Result Value Ref Range Status   Specimen Description SPUTUM  Final   Special Requests NONE  Final   Sputum evaluation   Final    THIS SPECIMEN IS ACCEPTABLE. RESPIRATORY CULTURE REPORT TO FOLLOW.   Report Status 04/17/2016 FINAL  Final  Culture, respiratory (NON-Expectorated)     Status: None   Collection Time: 04/17/16  5:58 AM  Result Value Ref Range Status   Specimen Description SPUTUM  Final   Special Requests NONE  Final   Gram Stain   Final    ABUNDANT WBC PRESENT, PREDOMINANTLY PMN FEW SQUAMOUS EPITHELIAL CELLS PRESENT RARE GRAM POSITIVE COCCI IN PAIRS RARE GRAM NEGATIVE COCCI IN PAIRS    Culture   Final    Consistent with normal respiratory flora. Performed at [facility]    Report Status 04/19/2016 FINAL  Final      Radiology Studies: Ct Head W & Wo Contrast  Result Date: 04/21/2016 CLINICAL DATA:  45 year old male recently diagnosed with left lung mass, suspected stage IV non-small cell lung cancer. Unable to tolerate MRI. Staging. Subsequent encounter. EXAM: CT HEAD WITHOUT AND WITH CONTRAST TECHNIQUE: Contiguous axial images were obtained from the base of the skull through the vertex without and with  intravenous contrast CONTRAST:  75 mL ISOVUE-300 IOPAMIDOL (ISOVUE-300) INJECTION 61% COMPARISON:  Limited brain MRI 04/20/2016. Noncontrast head CT 11/03/2012. FINDINGS: Brain: No midline shift, ventriculomegaly, mass effect, evidence of mass lesion, intracranial hemorrhage or evidence of cortically based acute infarction. Gray-white matter differentiation is within normal limits throughout the brain. No abnormal enhancement identified. Vascular: Calcified atherosclerosis at the skull base. Major intracranial vascular structures are enhancing. Skull: No acute or suspicious osseous lesion. Sinuses/Orbits: Previous left mastoidectomy appears stable. Trace right mastoid effusion is stable. Visible paranasal sinuses remain well pneumatized. Other: Visualized orbits and scalp soft tissues are within normal limits. IMPRESSION: Normal CT appearance of the brain. No metastatic disease identified. Electronically Signed   By: Genevie Ann M.D.   On: 04/21/2016 09:58   Mr Brain Wo Contrast  Result Date: 04/20/2016 CLINICAL DATA:  Lung mass.  Rule out metastatic disease EXAM: MRI HEAD WITHOUT CONTRAST TECHNIQUE: Multiplanar, multiecho pulse sequences of the brain and surrounding structures were obtained without intravenous contrast. COMPARISON:  CT head 11/03/2012 FINDINGS: Incomplete study. Sagittal T1 and axial diffusion only were obtained. The patient refused any further imaging. Ventricle size normal. Negative for acute infarct. No large mass lesion identified. Pituitary not enlarged. Calvarium is  intact right IMPRESSION: Incomplete study. There is not adequate information to rule out metastatic disease. No large mass identified. If the patient refuses further MRI scanning with sedation, CT head without with contrast is suggested for staging. Electronically Signed   By: Franchot Gallo M.D.   On: 04/20/2016 06:59   Ct Abdomen Pelvis W Contrast  Result Date: 04/19/2016 CLINICAL DATA:  45 year old male presents with  cough weight loss and poor appetite. EXAM: CT ABDOMEN AND PELVIS WITH CONTRAST TECHNIQUE: Multidetector CT imaging of the abdomen and pelvis was performed using the standard protocol following bolus administration of intravenous contrast. CONTRAST:  146m ISOVUE-300 IOPAMIDOL (ISOVUE-300) INJECTION 61% COMPARISON:  Chest CT dated 04/15/2016 FINDINGS: Lower chest: There is complete consolidative changes of the left lung base as seen on the prior CT with associated volume loss and compensatory expansion of the right lung. There is shift of the mediastinum to the to the left hemithorax as seen on the prior CT. These findings are better evaluated on the chest CT of 04/15/2016. Correlation with clinical exam and follow-up to resolution recommended. The visualized right lung base is clear. There is no intra-abdominal free air.  No free fluid noted. Hepatobiliary: No focal liver abnormality is seen. No gallstones, gallbladder wall thickening, or biliary dilatation. Pancreas: Unremarkable. No pancreatic ductal dilatation or surrounding inflammatory changes. Spleen: Normal in size without focal abnormality. Adrenals/Urinary Tract: Adrenal glands are unremarkable. Kidneys are normal, without renal calculi, focal lesion, or hydronephrosis. Bladder is unremarkable. Stomach/Bowel: There is moderate amount of stool throughout the colon. There is no evidence of bowel obstruction or active inflammation. Normal appendix. Vascular/Lymphatic: No significant vascular findings are present. No enlarged abdominal or pelvic lymph nodes. Reproductive: The prostate and seminal vesicles are grossly unremarkable. Other: There is mild diffuse subcutaneous edema. No fluid collection. Musculoskeletal: There is diffuse disc bulge with disc desiccation and vacuum phenomena at L5-S1. Mild chronic bilateral sacroiliitis. The osseous structures are otherwise intact. IMPRESSION: No acute intra-abdominopelvic pathology. No findings suspicious of  malignancy within the abdomen or pelvis. Complete consolidative changes of the visualized left lung base with volume loss and shift of the mediastinum into the left hemithorax as seen on the prior CT. Correlation with clinical exam and follow-up to resolution is recommended. Electronically Signed   By: AAnner CreteM.D.   On: 04/19/2016 21:53     Scheduled Meds: . budesonide (PULMICORT) nebulizer solution  0.5 mg Nebulization BID  . enoxaparin (LOVENOX) injection  40 mg Subcutaneous Q24H  . feeding supplement (ENSURE ENLIVE)  237 mL Oral BID BM  . guaiFENesin  1,200 mg Oral BID  . ipratropium-albuterol  3 mL Nebulization TID  . nicotine  21 mg Transdermal Daily  . piperacillin-tazobactam (ZOSYN)  IV  3.375 g Intravenous Q8H  . protein supplement shake  11 oz Oral BID BM   Continuous Infusions:   LOS: 6 days    Time spent: 30 min    SJanece Canterbury MD [facility] Pager [phone] If 7PM-7AM, please contact night-coverage [email or web address] Password [email or web address] Center12/13/2017, 6:26 PM

## 2016-04-21 NOTE — Progress Notes (Signed)
Report called to [place] in stepdown [employer]. Brigitte Pulse, RN

## 2016-04-21 NOTE — Progress Notes (Signed)
Pharmacy Antibiotic Note  Douglas Kelly is a 45 y.o. male admitted on 04/15/2016 with pneumonia and L lung collapse. Pharmacy was consulted for Vancomycin and Zosyn dosing, now narrowed Zosyn alone.  Today, 04/21/2016:  Renal function stable with CrCl > 100 ml/min   Remains afebrile  WBC remains elevated (17.9 > 12.5 > 13.3 > 12.1)  Plan:  Continue Zosyn 3.375g IV Q8H infused over 4hrs.  Follow up renal fxn, culture results, and clinical course. Follow up planned duration of therapy.   Height: '5\' 8"'$  (172.7 cm) Weight: 150 lb 2.1 oz (68.1 kg) IBW/kg (Calculated) : 68.4  Temp (24hrs), Avg:97.8 F (36.6 C), Min:97.2 F (36.2 C), Max:98.6 F (37 C)   Recent Labs Lab 04/15/16 1659 04/15/16 2252  04/17/16 0530 04/18/16 0525 04/18/16 1314 04/19/16 0520 04/20/16 0534 04/21/16 0530  WBC  --  20.7*  --  17.9*  --   --  12.5* 13.3* 12.1*  CREATININE  --  0.74  < > 0.71 0.63  --  0.72 0.67 0.75  LATICACIDVEN 2.13*  --   --   --   --   --   --   --   --   VANCOTROUGH  --   --   --   --   --  14*  --   --   --   < > = values in this interval not displayed.  Estimated Creatinine Clearance: 110 mL/min (by C-G formula based on SCr of 0.75 mg/dL).    No Known Allergies  Antimicrobials this admission:  Vancomycin 04/15/2016 >> 12/11 Zosyn 04/15/2016 >>   Dose adjustments this admission:  12/10 1300 VT = 14 on vanc 1g q8h (before 8th dose), slightly below goal of 15-20, but may have further accumulation.  No changes.  Microbiology results:  12/7 BCx: NGF 12/7 BCx repeat: ng4d 12/7 UCx: NGF  12/7 Sputum: poor sample 12/9 Sputum: normal flora S. pneumo UAg: neg HIV: neg  Thank you for allowing pharmacy to be a part of this patient's care.  Gretta Arab PharmD, BCPS Pager [phone] 04/21/2016 8:01 AM

## 2016-04-21 NOTE — Progress Notes (Signed)
CPT not done on pt due to pt going to radiation for treatment.

## 2016-04-21 NOTE — Progress Notes (Signed)
  Radiation Oncology         [phone] ________________________________  Name: VAUGHN BEAUMIER MRN: [id number]  Date: 04/21/2016  DOB: [birth year]  INPATIENT   SIMULATION AND TREATMENT PLANNING NOTE  DIAGNOSIS:  45 yo man with stage T3 Nx Mx adenocarcinoma of the left lung - pending PET scan  NARRATIVE:  The patient was brought to the [facility].  Identity was confirmed.  All relevant records and images related to the planned course of therapy were reviewed.  The patient freely provided informed written consent to proceed with treatment after reviewing the details related to the planned course of therapy. The consent form was witnessed and verified by the simulation staff.  Then, the patient was set-up in a stable reproducible  supine position for radiation therapy.  CT images were obtained.  Surface markings were placed.  The CT images were loaded into the planning software.  Then the target and avoidance structures were contoured.  Treatment planning then occurred.  The radiation prescription was entered and confirmed.  Then, I designed and supervised the construction of a total of 6 medically necessary complex treatment devices, including a BodyFix immobilization mold custom fitted to the patient along with 5 multileaf collimators conformally shaped radiation around the treatment target while shielding critical structures such as the heart and spinal cord maximally.  I have requested : 3D Simulation  I have requested a DVH of the following structures: Left lung, right lung, spinal cord, heart, esophagus, and target.  I have ordered:Nutrition Consult  SPECIAL TREATMENT PROCEDURE:  The planned course of therapy using radiation constitutes a special treatment procedure. Special care is required in the management of this patient for the following reasons.  The patient will be receiving concurrent chemotherapy requiring careful monitoring for increased toxicities of treatment including  periodic laboratory values.  The special nature of the planned course of radiotherapy will require increased physician supervision and oversight to ensure patient's safety with optimal treatment outcomes.  PLAN:  The patient will receive up to 66 Gy in 33 fractions.  ________________________________  Sheral Apley Tammi Klippel, M.D.

## 2016-04-21 NOTE — Progress Notes (Signed)
[facility] PCCM  S: Feels better, strength back O:  Vitals:   04/20/16 1300 04/20/16 1407 04/20/16 2159 04/21/16 0502  BP: 118/62  132/66 118/69  Pulse: 98  98 72  Resp: '16  16 17  '$ Temp: 98.6 F (37 C)  97.2 F (36.2 C) 97.6 F (36.4 C)  TempSrc: Oral  Oral Oral  SpO2: 98% 99% 98% 96%  Weight:      Height:       RA  On exam PULM: Diminished breath sounds left base Derm: well perfused extremities Neuro: Awake, alert, no distress Psyche: Normal mood, affect  Oncology consult note reviewed  Non-small cell lung cancer: completely obstructing left lung, agree with starting radiation therapy; we are happy to perform a repeat biopsy by bronchoscopy under general anesthesia if oncology feels this is appropriate.  Please let us know if needed.  PCCM will sign off  Roselie Awkward, MD [place] [facility]M Pager: [phone] Cell: [phone] After 3pm or if no response, call [phone]

## 2016-04-22 ENCOUNTER — Ambulatory Visit
Admit: 2016-04-22 | Discharge: 2016-04-22 | Disposition: A | Discharge status: Home / Self Care | Payer: Medicaid Other | Attending: Radiation Oncology | Admitting: Radiation Oncology

## 2016-04-22 ENCOUNTER — Inpatient Hospital Stay (HOSPITAL_COMMUNITY): Payer: Medicaid Other

## 2016-04-22 DIAGNOSIS — R918 Other nonspecific abnormal finding of lung field: Secondary | ICD-10-CM

## 2016-04-22 LAB — BASIC METABOLIC PANEL
Anion gap: 9 (ref 5–15)
BUN: 13 mg/dL (ref 6–20)
CALCIUM: 9.3 mg/dL (ref 8.9–10.3)
CO2: 27 mmol/L (ref 22–32)
CREATININE: 0.82 mg/dL (ref 0.61–1.24)
Chloride: 99 mmol/L — ABNORMAL LOW (ref 101–111)
GFR calc Af Amer: >60 mL/min (ref >60)
GFR calc non Af Amer: >60 mL/min (ref >60)
Glucose, Bld: 111 mg/dL — ABNORMAL HIGH (ref 65–99)
Potassium: 4.7 mmol/L (ref 3.5–5.1)
SODIUM: 135 mmol/L (ref 135–145)

## 2016-04-22 LAB — CBC
HCT: 35.1 % — ABNORMAL LOW (ref 39.0–52.0)
Hemoglobin: 11.2 g/dL — ABNORMAL LOW (ref 13.0–17.0)
MCH: 29.9 pg (ref 26.0–34.0)
MCHC: 31.9 g/dL (ref 30.0–36.0)
MCV: 93.9 fL (ref 78.0–100.0)
PLATELETS: 773 10*3/uL — AB (ref 150–400)
RBC: 3.74 MIL/uL — ABNORMAL LOW (ref 4.22–5.81)
RDW: 14.1 % (ref 11.5–15.5)
WBC: 14.2 10*3/uL — AB (ref 4.0–10.5)

## 2016-04-22 MED ORDER — EPINEPHRINE PF 1 MG/10ML IJ SOSY
PREFILLED_SYRINGE | INTRAMUSCULAR | Status: AC
  Administered 2016-04-22: 1 mg
  Filled 2016-04-22: qty 10

## 2016-04-22 MED ORDER — ETOMIDATE 2 MG/ML IV SOLN
20.0000 mg | Freq: Once | INTRAVENOUS | Status: AC
  Administered 2016-04-22: 20 mg via INTRAVENOUS

## 2016-04-22 MED ORDER — FENTANYL CITRATE (PF) 100 MCG/2ML IJ SOLN
INTRAMUSCULAR | Status: AC
  Administered 2016-04-22: 50 ug
  Filled 2016-04-22: qty 2

## 2016-04-22 MED ORDER — MIDAZOLAM HCL 2 MG/2ML IJ SOLN
INTRAMUSCULAR | Status: AC
  Filled 2016-04-22: qty 2

## 2016-04-22 MED ORDER — VITAMIN B-12 1000 MCG PO TABS
1000.0000 ug | ORAL_TABLET | Freq: Every day | ORAL | Status: DC
  Administered 2016-04-23: 1000 ug via ORAL
  Filled 2016-04-22: qty 1

## 2016-04-22 MED ORDER — MIDAZOLAM HCL 2 MG/2ML IJ SOLN
INTRAMUSCULAR | Status: AC
  Administered 2016-04-22: 2 mg
  Filled 2016-04-22: qty 2

## 2016-04-22 MED ORDER — ROCURONIUM BROMIDE 50 MG/5ML IV SOLN
70.0000 mg | Freq: Once | INTRAVENOUS | Status: AC
Start: 2016-04-22 — End: 2016-04-22
  Administered 2016-04-22: 70 mg via INTRAVENOUS

## 2016-04-22 MED ORDER — PROPOFOL 1000 MG/100ML IV EMUL
INTRAVENOUS | Status: AC
  Administered 2016-04-22: 20 ug/kg/min
  Filled 2016-04-22: qty 100

## 2016-04-22 MED ORDER — FENTANYL CITRATE (PF) 100 MCG/2ML IJ SOLN
INTRAMUSCULAR | Status: AC
  Administered 2016-04-22: 100 ug
  Filled 2016-04-22: qty 2

## 2016-04-22 NOTE — Progress Notes (Signed)
PROGRESS NOTE  Douglas Kelly  TGG:[id number] DOB: [birth year] DOA: 04/15/2016 PCP: No primary care provider on file.  Brief Narrative:  45 y.o.malewith no sig PMHx, on no medications presents with of cough, sweats, wt loss and poor appetite. CXR showed complete white out of the LEFT chest with mediastinal L shift c/w some lung collapse. CT chest showed LEFT sided lobar collapse with L shift, air bronchograms.  Pulmonology was consulted and patient underwent upper endoscopy on 12/11. A large malignant mass was identified however his biopsy was benign. Brushing confirmed non-small cell lung cancer, likely adenocarcinoma, however not enough tissue was obtained for additional testing. Patient transferred to the intensive care unit and underwent repeat bronchoscopy and biopsy on 12/14 while intubated and on the ventilator. Patient met with radiation oncology and is initiating radiation treatments.    Assessment & Plan:   Principal Problem:   CAP (community acquired pneumonia) Active Problems:   Cough   Fever   Leukocytosis   Loss of weight   Normocytic anemia   LFTs abnormal   Smoker   Collapse of left lung, hx of pneumonia 5 months ago   Pneumonia of left upper lobe due to infectious organism (HCC)   Protein-calorie malnutrition, severe   Lung mass   Adenocarcinoma of left lung, stage 4 (HCC)   Sepsis due to Postobstructive Pneumonia due to non-small cell lung cancer, patient presented with fever, tachycardia, tachypnea, elevated lactic acid and leukocytosis, sepsis criteria met on admission.  Initially placed on broad-spectrum antibiotics vancomycin and Zosyn. - Stopped IV vancomycin 12/11 - Last day of Zosyn is today, day 7 of 7 - Repeat biopsy on 12/14, appreciate PC CM assistance - CT abdomen and pelvis for staging with no evidence of metastasis -  CT head with and without contrast negative for brain metastases. -  Extubation per PCCM  Right lung nodules - Appreciate pulmonology  assistance  Elevated LFTs - Most likely to sepsis, remained stable, negative hepatitis panel, no acute finding in right upper quadrant ultrasound  Tobacco abuse - Counseled, requesting a nicotine patch  Anemia and thrombus or ptosis, due to malignancy - Anemia of chronic illness, B12 level is borderline - Started oral vitamin B12 supplementation tomorrow  Severe protein calorie malnutrition - Resume supplements post extubation  DVT prophylaxis:  Lovenox Code Status:  Full code Family Communication:  Patient and his wife Disposition Plan:  Pending repeat biopsy, clinical stability postextubation. Anticipate home in 1-2 days with close outpatient follow-up with oncology and ongoing radiation treatments. He will need an outpatient PET scan.   Consultants:   Radiation oncology contact mom with Dr. Tammi Klippel  Oncology, Dr. Julien Nordmann  Pulmonary and critical care, Dr. Lake Bells  Procedures:  12/11: Bronchoscopy with biopsy of mass 12/14: Bronchoscopy with biopsy of mass  Antimicrobials:  Anti-infectives    Start     Dose/Rate Route Frequency Ordered Stop   04/16/16 0600  vancomycin (VANCOCIN) IVPB 1000 mg/200 mL premix  Status:  Discontinued     1,000 mg 200 mL/hr over 60 Minutes Intravenous Every 8 hours 04/16/16 0242 04/19/16 1513   04/15/16 2215  piperacillin-tazobactam (ZOSYN) IVPB 3.375 g     3.375 g 12.5 mL/hr over 240 Minutes Intravenous Every 8 hours 04/15/16 2210     04/15/16 1828  vancomycin (VANCOCIN) 500 MG powder    Comments:  Peel, Adrienne   : cabinet override      04/15/16 1828 04/16/16 0644   04/15/16 1649  vancomycin (VANCOCIN) 500 MG powder  Comments:  Peel, Adrienne   : cabinet override      04/15/16 1649 04/15/16 1830   04/15/16 1649  vancomycin (VANCOCIN) 1-5 GM/200ML-% IVPB    Comments:  Peel, Adrienne   : cabinet override      04/15/16 1649 04/15/16 1830   04/15/16 1645  piperacillin-tazobactam (ZOSYN) IVPB 3.375 g     3.375 g 100 mL/hr over 30  Minutes Intravenous  Once 04/15/16 1638 04/15/16 1723   04/15/16 1645  vancomycin (VANCOCIN) 1,270 mg in sodium chloride 0.9 % 500 mL IVPB  Status:  Discontinued     20 mg/kg  63.5 kg 250 mL/hr over 120 Minutes Intravenous  Once 04/15/16 1638 04/16/16 0241          Subjective: States he Feels worse today. He feels more Douglas Kelly of breath and his cough is worse. He did not sleep well.  Objective: Vitals:   04/22/16 1130 04/22/16 1135 04/22/16 1140 04/22/16 1145  BP: 118/90 (!) 164/108 (!) 142/92 121/79  Pulse:      Resp: (!) 9 14 11  (!) 23  Temp:      TempSrc:      SpO2: 98% 98% 97% 96%  Weight:      Height:        Intake/Output Summary (Last 24 hours) at 04/22/16 1159 Last data filed at 04/22/16 0800  Gross per 24 hour  Intake              790 ml  Output             1675 ml  Net             -885 ml   Filed Weights   04/15/16 1557 04/15/16 2050 04/21/16 1819  Weight: 63.5 kg (140 lb) 68.1 kg (150 lb 2.1 oz) 68.7 kg (151 lb 7.3 oz)    Examination:  General exam:  Adult Male, Cachectic.  No acute distress.  HEENT:  NCAT, MMM Respiratory system:  Diminished over the left lung fields to the midback. Breath sounds present but diminished at the apex on the left side. No wheezes, rales, rhonchi Cardiovascular system: Regular rate and rhythm, normal S1/S2. No murmurs, rubs, gallops or clicks.  Warm extremities Gastrointestinal system: Normal active bowel sounds, soft, nondistended, nontender. MSK:  Normal tone and bulk, no lower extremity edema Neuro:  Grossly intact    Data Reviewed: I have personally reviewed following labs and imaging studies  CBC:  Recent Labs Lab 04/15/16 1616  04/17/16 0530 04/19/16 0520 04/20/16 0534 04/21/16 0530 04/22/16 0506  WBC 23.2*  < > 17.9* 12.5* 13.3* 12.1* 14.2*  NEUTROABS 20.0*  --   --   --   --   --   --   HGB 10.2*  < > 9.9* 10.1* 10.2* 10.3* 11.2*  HCT 31.2*  < > 30.3* 31.7* 31.7* 32.1* 35.1*  MCV 93.1  < > 93.2 93.5 93.0  93.3 93.9  PLT 585*  < > 610* 677* 679* 669* 773*  < > = values in this interval not displayed. Basic Metabolic Panel:  Recent Labs Lab 04/18/16 0525 04/19/16 0520 04/20/16 0534 04/21/16 0530 04/22/16 0506  NA 136 137 135 136 135  K 4.2 4.8 4.2 4.8 4.7  CL 102 101 102 101 99*  CO2 26 28 26 27 27   GLUCOSE 126* 114* 142* 110* 111*  BUN 8 10 12 12 13   CREATININE 0.63 0.72 0.67 0.75 0.82  CALCIUM 8.5* 8.6* 8.4* 8.9 9.3  GFR: Estimated Creatinine Clearance: 107.7 mL/min (by C-G formula based on SCr of 0.82 mg/dL). Liver Function Tests:  Recent Labs Lab 04/15/16 1616 04/17/16 0530 04/18/16 0525 04/20/16 0534  AST 74* 81* 69* 41  ALT 139* 205* 204* 117*  ALKPHOS 124 147* 139* 107  BILITOT 0.4 0.3 0.4 0.6  PROT 6.6 6.1* 6.1* 6.4*  ALBUMIN 2.3* 2.2* 2.2* 2.4*   No results for input(s): LIPASE, AMYLASE in the last 168 hours. No results for input(s): AMMONIA in the last 168 hours. Coagulation Profile:  Recent Labs Lab 04/16/16 1421  INR 1.21   Cardiac Enzymes:  Recent Labs Lab 04/15/16 1616  TROPONINI <0.03   BNP (last 3 results) No results for input(s): PROBNP in the last 8760 hours. HbA1C: No results for input(s): HGBA1C in the last 72 hours. CBG: No results for input(s): GLUCAP in the last 168 hours. Lipid Profile: No results for input(s): CHOL, HDL, LDLCALC, TRIG, CHOLHDL, LDLDIRECT in the last 72 hours. Thyroid Function Tests: No results for input(s): TSH, T4TOTAL, FREET4, T3FREE, THYROIDAB in the last 72 hours. Anemia Panel: No results for input(s): VITAMINB12, FOLATE, FERRITIN, TIBC, IRON, RETICCTPCT in the last 72 hours. Urine analysis:    Component Value Date/Time   COLORURINE YELLOW 04/15/2016 1908   APPEARANCEUR CLEAR 04/15/2016 1908   LABSPEC >1.046 (H) 04/15/2016 1908   PHURINE 7.5 04/15/2016 1908   GLUCOSEU NEGATIVE 04/15/2016 1908   HGBUR NEGATIVE 04/15/2016 [place] NEGATIVE 04/15/2016 [place] NEGATIVE 04/15/2016  1908   PROTEINUR NEGATIVE 04/15/2016 1908   NITRITE NEGATIVE 04/15/2016 1908   LEUKOCYTESUR NEGATIVE 04/15/2016 1908   Sepsis Labs: @LABRCNTIP (procalcitonin:4,lacticidven:4)  ) Recent Results (from the past 240 hour(s))  Blood culture (routine x 2)     Status: None   Collection Time: 04/15/16  4:40 PM  Result Value Ref Range Status   Specimen Description BLOOD LEFT HAND  Final   Special Requests BOTTLES DRAWN AEROBIC AND ANAEROBIC 5CC EACH  Final   Culture   Final    NO GROWTH 5 DAYS Performed at [facility]    Report Status 04/20/2016 FINAL  Final  Blood culture (routine x 2)     Status: None   Collection Time: 04/15/16  4:55 PM  Result Value Ref Range Status   Specimen Description BLOOD RIGHT HAND  Final   Special Requests BOTTLES DRAWN AEROBIC AND ANAEROBIC 5CC EACH  Final   Culture   Final    NO GROWTH 5 DAYS Performed at [facility]    Report Status 04/20/2016 FINAL  Final  Urine culture     Status: None   Collection Time: 04/15/16  7:08 PM  Result Value Ref Range Status   Specimen Description URINE, RANDOM  Final   Special Requests NONE  Final   Culture NO GROWTH Performed at [facility]   Final   Report Status 04/17/2016 FINAL  Final  Culture, sputum-assessment     Status: None   Collection Time: 04/15/16 10:01 PM  Result Value Ref Range Status   Specimen Description SPUTUM  Final   Special Requests NONE  Final   Sputum evaluation   Final    MICROSCOPIC FINDINGS SUGGEST THAT THIS SPECIMEN IS NOT REPRESENTATIVE OF LOWER RESPIRATORY SECRETIONS. PLEASE RECOLLECT. NOTIFIED RN Thana Farr @2336  ZANDO,C    Report Status 04/16/2016 FINAL  Final  Culture, blood (routine x 2) Call MD if unable to obtain prior to antibiotics being given     Status: None  Collection Time: 04/15/16 10:52 PM  Result Value Ref Range Status   Specimen Description BLOOD RIGHT ARM  Final   Special Requests BOTTLES DRAWN AEROBIC AND ANAEROBIC 6 CC  Final   Culture    Final    NO GROWTH 5 DAYS Performed at [facility]    Report Status 04/21/2016 FINAL  Final  Culture, blood (routine x 2) Call MD if unable to obtain prior to antibiotics being given     Status: None   Collection Time: 04/15/16 10:52 PM  Result Value Ref Range Status   Specimen Description BLOOD RIGHT HAND  Final   Special Requests BOTTLES DRAWN AEROBIC AND ANAEROBIC 6 CC  Final   Culture   Final    NO GROWTH 5 DAYS Performed at [facility]    Report Status 04/21/2016 FINAL  Final  Culture, expectorated sputum-assessment     Status: None   Collection Time: 04/17/16  5:58 AM  Result Value Ref Range Status   Specimen Description SPUTUM  Final   Special Requests NONE  Final   Sputum evaluation   Final    THIS SPECIMEN IS ACCEPTABLE. RESPIRATORY CULTURE REPORT TO FOLLOW.   Report Status 04/17/2016 FINAL  Final  Culture, respiratory (NON-Expectorated)     Status: None   Collection Time: 04/17/16  5:58 AM  Result Value Ref Range Status   Specimen Description SPUTUM  Final   Special Requests NONE  Final   Gram Stain   Final    ABUNDANT WBC PRESENT, PREDOMINANTLY PMN FEW SQUAMOUS EPITHELIAL CELLS PRESENT RARE GRAM POSITIVE COCCI IN PAIRS RARE GRAM NEGATIVE COCCI IN PAIRS    Culture   Final    Consistent with normal respiratory flora. Performed at [facility]    Report Status 04/19/2016 FINAL  Final  MRSA PCR Screening     Status: None   Collection Time: 04/21/16  6:20 PM  Result Value Ref Range Status   MRSA by PCR NEGATIVE NEGATIVE Final    Comment:        The GeneXpert MRSA Assay (FDA approved for NASAL specimens only), is one component of a comprehensive MRSA colonization surveillance program. It is not intended to diagnose MRSA infection nor to guide or monitor treatment for MRSA infections.       Radiology Studies: Ct Head W & Wo Contrast  Result Date: 04/21/2016 CLINICAL DATA:  45 year old male recently diagnosed with left lung mass,  suspected stage IV non-small cell lung cancer. Unable to tolerate MRI. Staging. Subsequent encounter. EXAM: CT HEAD WITHOUT AND WITH CONTRAST TECHNIQUE: Contiguous axial images were obtained from the base of the skull through the vertex without and with intravenous contrast CONTRAST:  75 mL ISOVUE-300 IOPAMIDOL (ISOVUE-300) INJECTION 61% COMPARISON:  Limited brain MRI 04/20/2016. Noncontrast head CT 11/03/2012. FINDINGS: Brain: No midline shift, ventriculomegaly, mass effect, evidence of mass lesion, intracranial hemorrhage or evidence of cortically based acute infarction. Gray-white matter differentiation is within normal limits throughout the brain. No abnormal enhancement identified. Vascular: Calcified atherosclerosis at the skull base. Major intracranial vascular structures are enhancing. Skull: No acute or suspicious osseous lesion. Sinuses/Orbits: Previous left mastoidectomy appears stable. Trace right mastoid effusion is stable. Visible paranasal sinuses remain well pneumatized. Other: Visualized orbits and scalp soft tissues are within normal limits. IMPRESSION: Normal CT appearance of the brain. No metastatic disease identified. Electronically Signed   By: Genevie Ann M.D.   On: 04/21/2016 09:58     Scheduled Meds: . budesonide (PULMICORT) nebulizer solution  0.5 mg Nebulization BID  . enoxaparin (LOVENOX) injection  40 mg Subcutaneous Q24H  . EPINEPHrine      . fentaNYL      . fentaNYL      . guaiFENesin  1,200 mg Oral BID  . ipratropium-albuterol  3 mL Nebulization TID  . midazolam      . midazolam      . nicotine  21 mg Transdermal Daily  . piperacillin-tazobactam (ZOSYN)  IV  3.375 g Intravenous Q8H  . protein supplement shake  11 oz Oral BID BM   Continuous Infusions:   LOS: 7 days    Time spent: 30 min    Janece Canterbury, MD [facility] Pager [phone]  If 7PM-7AM, please contact night-coverage [email or web address] Password [email or web address] 04/22/2016, 11:59 AM

## 2016-04-22 NOTE — Progress Notes (Signed)
Nutrition Follow-up  DOCUMENTATION CODES:   Severe malnutrition in context of acute illness/injury  INTERVENTION:  - Continue Premier Protein BID and will d/c Ensure Enlive. - Continue to encourage PO intakes of meals and supplements when diet advanced after biopsy today. - RD will continue to monitor for nutrition-related needs.  NUTRITION DIAGNOSIS:   Unintentional weight loss related to poor appetite, chronic illness as evidenced by per patient/family report, percent weight loss. -ongoing, improved appetite.  GOAL:   Patient will meet greater than or equal to 90% of their needs -met  MONITOR:   PO intake, Supplement acceptance, Weight trends, Labs, I & O's  ASSESSMENT:   45 y.o. male with no sig PMHx, on no medications presented to [facility] with 3-5 month history of cough, sweats , wt loss and poor appetite.  CXR showed complete white out of L chest with mediastinal L shift consistent with some lung collapse.  CT chest showed L-sided lobar collapse with L shift, air bronchograms.  12/14 Per chart review, pt consumed 100% of lunch on 12/11 (1500 kcal and 41 grams of protein) and 100% of breakfast yesterday (1856 kcal and 67 grams of protein). Pt has been NPO since midnight pending repeat biopsy today and states that he is feeling very hungry. His last meal was dinner ~1800 yesterday and reports that for this meal he had [place], stuffing, green beans, pudding, applesauce, and other items he cannot recall. He denies any pain or other difficulties with eating and confirms he has been having BMs.   Pt states he has received Premier Protein (ordered by RD 128; Ensure Enlive was ordered 12/9 by another care provider) and that he would like to continue Premier when he is no longer NPO.  Estimated nutrition needs updated based on dx of L lung cancer (stage 4) with radiation started 12/13. Will continue to adjust as needed based on weight trends with PO intakes.   Medications  reviewed. Labs reviewed; Cl: 99 mmol/L.   12/8 - No intakes documented since admission, but pt reports almost 100% completion of breakfast (omelete, grits, white toast, banana, strawberry yogurt, coffee, and hot chocolate).  - Pt received lunch tray as RD was entering his room.  - Significant other is at bedside and has ordered a guest tray for lunch; unsure if some of the intake of breakfast meal could have been shared rather than fully consumed by the patient.  - Over the past "few months" he has had symptoms outlined above, from H&P. - He  usually has a very good appetite and would eat a large breakfast and have 2 plates of food each for lunch and for dinner.  - Over the past 1 month he has lost 20 lbs d/t symptoms and decreasing appetite and intakes.  - For the past ~1 month he mainly has only eaten a few bites at each meal which is very unusual for him.  - Since admission appetite has begun to return.  - He denies abdominal pain or nausea with or without PO intakes PTA.  - He states he was having L-sided flank pain but that this did not affect abdominal area.  - Per reported weight loss, pt has lost 12% body weight which is significant for both 1 month and 5 month time frames.  - No previous weight hx available for comparison to pt's report.   IVF: NS @ 125 mL/hr.    Diet Order:  Diet NPO time specified  Skin:  Reviewed, no issues  Last BM:  12/13  Height:   Ht Readings from Last 1 Encounters:  04/21/16 5' 8"  (1.727 m)    Weight:   Wt Readings from Last 1 Encounters:  04/21/16 151 lb 7.3 oz (68.7 kg)    Ideal Body Weight:  70 kg  BMI:  Body mass index is 23.03 kg/m.  Estimated Nutritional Needs:   Kcal:  6546-5035 (30-35 kcal/kg)  Protein:  95-109 grams (1.4-1.6 grams/kg)  Fluid:  2 L/day  EDUCATION NEEDS:   No education needs identified at this time    Jarome Matin, MS, RD, LDN, CNSC Inpatient Clinical Dietitian Pager # [phone] After  hours/weekend pager # [phone]

## 2016-04-22 NOTE — Procedures (Signed)
Extubation Procedure Note  Patient Details:   Name: ETHER WOLTERS DOB: 1971/03/14 MRN: [id number]   Airway Documentation:     Evaluation  O2 sats: stable throughout Complications: No apparent complications Patient did tolerate procedure well. Bilateral Breath Sounds: Clear  Pt placed on 4 lpm [place] Yes  Danyia Borunda L 04/22/2016, 12:11 PM

## 2016-04-22 NOTE — Op Note (Signed)
[facility] Cardiopulmonary Patient Name: Douglas Kelly Procedure Date: 04/22/2016 MRN: [id number] Attending MD: Juanito Doom , MD Date of Birth: [birth year] CSN: [id number] Age: 45 Admit Type: Inpatient Ethnicity: Not Hispanic or Latino Procedure:            Bronchoscopy Indications:          Left mainstem mass Providers:            Nathaneil Canary B. Lake Bells, MD, Ashley Mariner Referring MD:          Medicines:            Midazolam 4 mg IV, Fentanyl 150 mcg IV, Propofol                        infusion 40 mcg/kg/min IV Complications:        No immediate complications Estimated Blood Loss: Estimated blood loss was minimal. Procedure:      Pre-Anesthesia Assessment:      - A History and Physical has been performed. Patient meds and allergies       have been reviewed. The risks and benefits of the procedure and the       sedation options and risks were discussed with the patient. All       questions were answered and informed consent was obtained. Patient       identification and proposed procedure were verified prior to the       procedure by the physician, the nurse and the technician in the       procedure room. Mental Status Examination: alert and oriented. Airway       Examination: normal oropharyngeal airway. Respiratory Examination: clear       to auscultation. CV Examination: normal. Prior Anticoagulants: The       patient has taken no previous anticoagulant or antiplatelet agents. ASA       Grade Assessment: II - A patient with mild systemic disease. After       reviewing the risks and benefits, the patient was deemed in satisfactory       condition to undergo the procedure. The anesthesia plan was to use       moderate sedation / analgesia (conscious sedation). Immediately prior to       administration of medications, the patient was re-assessed for adequacy       to receive sedatives. The heart rate, respiratory rate, oxygen       saturations, blood pressure,  adequacy of pulmonary ventilation, and       response to care were monitored throughout the procedure. The physical       status of the patient was re-assessed after the procedure.      After obtaining informed consent, the bronchoscope was passed under       direct vision. Throughout the procedure, the patient's blood pressure,       pulse, and oxygen saturations were monitored continuously. the [id number]       ([id number]) scope was introduced through the mouth, via the endotracheal       tube and advanced to the tracheobronchial tree of both lungs. The       procedure was accomplished without difficulty. The patient tolerated the       procedure well. The total duration of the procedure was 15 minutes. the       [id number] ([id number]) scope was introduced through the and advanced to the. Findings:      The nasopharynx/oropharynx  appears normal. The larynx appears normal.       The vocal cords appear normal. The subglottic space is normal. The       trachea is of normal caliber. The carina is sharp. The tracheobronchial       tree of the right lung was examined to at least the first subsegmental       level. Bronchial mucosa and anatomy in the right lung are normal; there       are no endobronchial lesions, and no secretions.      Left Lung Abnormalities: A completely obstructing mass was found in the       middle portion in the left mainstem bronchus. The mass was large and       endobronchial, fungating and polypoid. The lesion was not traversed.       Endobronchial needle aspiration of a mass was performed in the left       mainstem bronchus using a Wang needle and sent for histopathology       examination. Two samples were obtained. Endobronchial biopsies were       performed in the left mainstem bronchus using forceps and sent for       histopathology examination. 8 were obtained. Impression:      - Left mainstem mass      - The right lung was normal.      - An endobronchial, fungating and  polypoid mass was found in the left       mainstem bronchus. This lesion is malignant.      - Endobronchial needle aspiration was performed.      - An endobronchial biopsy was performed. Moderate Sedation:      General anesthesa in ICU Recommendation:      - Await biopsy results. Procedure Code(s):      --- Professional ---      [id number], Bronchoscopy, rigid or flexible, including fluoroscopic guidance,       when performed; with bronchial or endobronchial biopsy(s), single or       multiple sites Diagnosis Code(s):      --- Professional ---      R91.8, Other nonspecific abnormal finding of lung field      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 All rights reserved. The codes documented in this report are preliminary and upon coder review may  be revised to meet current compliance requirements. Norlene Campbell, MD Juanito Doom, MD 04/22/2016 12:21:43 PM This report has been signed electronically. Number of Addenda: 0 Scope In: 11:11:15 AM Scope Out: 11:38:10 AM

## 2016-04-22 NOTE — Procedures (Signed)
Bedside Bronchoscopy Procedure Note TASHA JINDRA [id number] May 06, 1971  Procedure: Bronchoscopy Indications: Obtain specimens for culture and/or other diagnostic studies  Procedure Details: ET Tube Size: 8.0 ET Tube secured at lip (cm): 24 Bite block in place: No In preparation for procedure, Patient hyper-oxygenated with 100 % FiO2 Airway entered and the following bronchi were examined: Bronchi.   Bronchoscope removed.  , Patient remained on 100% FiO2 throughout the procedure.    Evaluation BP 121/79   Pulse 98   Temp 98.9 F (37.2 C) (Oral)   Resp (!) 23   Ht '5\' 8"'$  (1.727 m)   Wt 151 lb 7.3 oz (68.7 kg)   SpO2 96%   BMI 23.03 kg/m  Breath Sounds:Clear O2 sats: stable throughout Patient's Current Condition: stable Specimens:  biopsies Complications: No apparent complications Patient did tolerate procedure well.   Kathie Dike 04/22/2016, 12:01 PM

## 2016-04-22 NOTE — H&P (Addendum)
  [facility]  HPI: Douglas Kelly presents with left lung collapse in the setting of cough, weight loss, and heavy tobacco use prior. Bronchoscopy showed non-small cell lung cancer, but we don't have further differentiation for tissue type or molecular markers. He feels OK now.   Past Medical History:  Diagnosis Date  . Pneumonia      History reviewed. No pertinent family history.   Social History   Social History  . Marital status: Single    Spouse name: N/A  . Number of children: N/A  . Years of education: N/A   Occupational History  . Not on file.   Social History Main Topics  . Smoking status: Current Every Day Smoker    Packs/day: 1.00    Types: Cigarettes  . Smokeless tobacco: Never Used  . Alcohol use No  . Drug use: No  . Sexual activity: No   Other Topics Concern  . Not on file   Social History Narrative  . No narrative on file     No Known Allergies   '@encmedstart'$ @  Vitals:   04/22/16 0400 04/22/16 0800 04/22/16 0900 04/22/16 1000  BP: 101/73 119/81 118/69 114/68  Pulse:      Resp: (!) '22 17 20 '$ (!) 23  Temp: 98.9 F (37.2 C) 98.9 F (37.2 C)    TempSrc: Oral Oral    SpO2: 93% 93% 93% 93%  Weight:      Height:    '5\' 8"'$  (1.727 m)   RA  Gen: well appearing HENT: OP clear, neck supple PULM: no breaths sounds left lung CV: RRR, no mgr, trace edema GI: BS+, soft, nontender Derm: no cyanosis or rash Psyche: normal mood and affect  CBC    Component Value Date/Time   WBC 14.2 (H) 04/22/2016 0506   RBC 3.74 (L) 04/22/2016 0506   HGB 11.2 (L) 04/22/2016 0506   HCT 35.1 (L) 04/22/2016 0506   PLT 773 (H) 04/22/2016 0506   MCV 93.9 04/22/2016 0506   MCH 29.9 04/22/2016 0506   MCHC 31.9 04/22/2016 0506   RDW 14.1 04/22/2016 0506   LYMPHSABS 1.8 04/15/2016 1616   MONOABS 1.4 (H) 04/15/2016 1616   EOSABS 0.0 04/15/2016 1616   BASOSABS 0.1 04/15/2016 1616   Images from his CT chest and CXR reviewed> left lung white out, no clear mass but abrupt cut  off of left mainstem bronchus noted  Cytology from brushing and washing from left mainstem bronchus from 12/11> non-small cell carcinoma, favor adenocarcinoma  Impression/Plan: Left lung/mainstem bronchus obstruction with left lung mass due to non-small cell lung cancer, favor adenocarcinoma.  Plan repeat bronchoscopy and biopsy today for tissue typing/molecular analysis. Will perform under general anesthesia due to severe cough which interfered with the procedure on 12/11.  Roselie Awkward, MD [facility] Pager: [phone] Cell: [phone] After 3pm or if no response, call [phone]

## 2016-04-22 NOTE — Progress Notes (Signed)
Patient intubated by Noe Gens, NP for bronch procedure. Patient tolerated intubation well with no complications noted. Patient now resting comfortably on vent post-bronch. RT will continue to monitor patient.

## 2016-04-22 NOTE — Procedures (Signed)
Intubation Procedure Note ARGEL PABLO [id number] 11-28-1970  Procedure: Intubation Indications: For bronchoscopy / tissue sampling of left lung mass.   Procedure Details Consent: Risks of procedure as well as the alternatives and risks of each were explained to the (patient/caregiver).  Consent for procedure obtained. Time Out: Verified patient identification, verified procedure, site/side was marked, verified correct patient position, special equipment/implants available, medications/allergies/relevent history reviewed, required imaging and test results available.  Performed  MAC and 4   Evaluation Hemodynamic Status: BP stable throughout; O2 sats: stable throughout Patient's Current Condition: stable Complications: No apparent complications Patient did tolerate procedure well. Chest X-ray ordered to verify placement.  CXR: pending.   Procedure performed under direct supervision of Dr. Lake Bells.      Noe Gens, NP-C [facility] Pgr: [phone] or if no answer [phone] 04/22/2016, 11:46 AM

## 2016-04-23 ENCOUNTER — Ambulatory Visit
Admit: 2016-04-23 | Discharge: 2016-04-23 | Disposition: A | Discharge status: Home / Self Care | Payer: Medicaid Other | Attending: Radiation Oncology | Admitting: Radiation Oncology

## 2016-04-23 DIAGNOSIS — Z510 Encounter for antineoplastic radiation therapy: Secondary | ICD-10-CM | POA: Diagnosis not present

## 2016-04-23 LAB — CBC
HCT: 34.9 % — ABNORMAL LOW (ref 39.0–52.0)
HEMOGLOBIN: 11.3 g/dL — AB (ref 13.0–17.0)
MCH: 30 pg (ref 26.0–34.0)
MCHC: 32.4 g/dL (ref 30.0–36.0)
MCV: 92.6 fL (ref 78.0–100.0)
PLATELETS: 754 10*3/uL — AB (ref 150–400)
RBC: 3.77 MIL/uL — AB (ref 4.22–5.81)
RDW: 14 % (ref 11.5–15.5)
WBC: 12.8 10*3/uL — ABNORMAL HIGH (ref 4.0–10.5)

## 2016-04-23 LAB — BASIC METABOLIC PANEL
Anion gap: 9 (ref 5–15)
BUN: 18 mg/dL (ref 6–20)
CALCIUM: 9.4 mg/dL (ref 8.9–10.3)
CO2: 30 mmol/L (ref 22–32)
Chloride: 98 mmol/L — ABNORMAL LOW (ref 101–111)
Creatinine, Ser: 0.82 mg/dL (ref 0.61–1.24)
GLUCOSE: 119 mg/dL — AB (ref 65–99)
Potassium: 5 mmol/L (ref 3.5–5.1)
Sodium: 137 mmol/L (ref 135–145)

## 2016-04-23 MED ORDER — POLYETHYLENE GLYCOL 3350 17 GM/SCOOP PO POWD: 17.0000 g | Freq: Once | ORAL | 0 refills | Status: AC

## 2016-04-23 MED ORDER — OXYCODONE-ACETAMINOPHEN 5-325 MG PO TABS: 1.0000 | ORAL_TABLET | Freq: Four times a day (QID) | ORAL | 0 refills | Status: DC | PRN

## 2016-04-23 MED ORDER — BISACODYL 5 MG PO TBEC: 5.0000 mg | DELAYED_RELEASE_TABLET | Freq: Every day | ORAL | 0 refills | Status: DC | PRN

## 2016-04-23 MED ORDER — NICOTINE 21 MG/24HR TD PT24: 21.0000 mg | MEDICATED_PATCH | Freq: Every day | TRANSDERMAL | 0 refills | Status: DC

## 2016-04-23 MED ORDER — CYANOCOBALAMIN 1000 MCG PO TABS: 1000.0000 ug | ORAL_TABLET | Freq: Every day | ORAL | 0 refills | Status: DC

## 2016-04-23 MED ORDER — IPRATROPIUM-ALBUTEROL 20-100 MCG/ACT IN AERS
1.0000 | INHALATION_SPRAY | Freq: Four times a day (QID) | RESPIRATORY_TRACT | Status: DC | PRN
  Filled 2016-04-23: qty 4

## 2016-04-23 MED ORDER — HYDROCOD POLST-CPM POLST ER 10-8 MG/5ML PO SUER: 5.0000 mL | Freq: Two times a day (BID) | ORAL | 0 refills | Status: DC | PRN

## 2016-04-23 MED ORDER — IPRATROPIUM-ALBUTEROL 18-103 MCG/ACT IN AERO: 1.0000 | INHALATION_SPRAY | RESPIRATORY_TRACT | 0 refills | Status: DC | PRN

## 2016-04-23 NOTE — Progress Notes (Signed)
Date: April 23, 2016 Discharge orders checked for needs. No case management needs present at time of discharge. Velva Harman, RN, BSN, [place]   [phone]

## 2016-04-23 NOTE — Progress Notes (Signed)
Discharge instructions reviewed with patient. Patient verbalized understanding. Patient discharged via private vehicle. 

## 2016-04-23 NOTE — Progress Notes (Signed)
   04/23/16 1400  Clinical Encounter Type  Visited With Patient  Visit Type Initial;Psychological support  Counseling intern met with patient at approximately 12:45 pm prior to discharge. Patient was alert, oriented, and conversed clearly with counselor throughout visit. Patient described his reactions to receiving the lung cancer diagnosis and noted that his father had died of cancer at age 47. Patient described his hard work ethic and how being ill has caused him to slow down. He is now turning his attention toward taking better care of himself. He expressed accepting that the cancer might not be curable but is hopeful it can be arrested. Patient described his relationships with his girlfriend and several members of his family, indicating that he feels supported by all of them. Patient expressed his appreciation for the staff care. Counselor offered to check-in with him as he goes through treatment in the [facility] and informed him of the availability of spiritual care support.  Lamount Cohen, Counseling Intern Department for Spiritual Care and [facility] Supervisor - Scientist, research (physical sciences)

## 2016-04-23 NOTE — Discharge Summary (Signed)
Physician Discharge Summary  Douglas Kelly:[id number] DOB: 1970-07-09 DOA: 04/15/2016  PCP: No primary care provider on file.  Admit date: 04/15/2016 Discharge date: 04/23/2016  Admitted From: Home  Disposition:  Home  Recommendations for Outpatient Follow-up:  1. Follow up with Dr. Julien Kelly to schedule outpatient PET scan, follow-up on biopsy results 2. Continue radiation per schedule given by Dr. Tammi Kelly  Home Health:  None  Equipment/Devices:  None  Discharge Condition:  Stable, improved CODE STATUS:  Full code  Diet recommendation:  Regular diet with supplements   Brief/Interim Summary:  45 y.o.malewith no sig PMHx, on no medications presents with of cough, sweats, wt loss and poor appetite. CXR showed complete white out of the LEFT chest with mediastinal L shift c/w some lung collapse. CT chest showed LEFT sided lobar collapse with L shift, air bronchograms.  Pulmonology was consulted and patient underwent upper endoscopy on 12/11. A large malignant mass was identified however his biopsy was benign. Brushing confirmed non-small cell lung cancer, likely adenocarcinoma, however not enough tissue was obtained for additional testing. Patient transferred to the intensive care unit and underwent repeat bronchoscopy and biopsy on 12/14 while intubated and on the ventilator. Patient met with radiation oncology and is initiating radiation treatments.   He was stable post extubation and was able to be discharged home without oxygen supplementation.  He will follow-up as an outpatient for PET scan and to discuss chemotherapy options with Dr. Julien Kelly in the next few weeks. He received his first radiation treatments during hospitalization.  Discharge Diagnoses:  Principal Problem:   Adenocarcinoma of left lung, stage 4 (HCC) Active Problems:   Postobstructive pneumonia   Normocytic anemia   LFTs abnormal   Smoker   Collapse of left lung, hx of pneumonia 5 months ago   Pneumonia of left  upper lobe due to infectious organism (HCC)   Protein-calorie malnutrition, severe  Sepsis due to Postobstructive Pneumonia due to non-small cell lung cancer, patient presented with fever, tachycardia, tachypnea, elevated lactic acid and leukocytosis, sepsis criteria met on admission.  Initially placed on broad-spectrum antibiotics vancomycin and Zosyn.  StoppedIV vancomycin 12/11.  He completed 7 days of Zosyn.  Pulmonary critical care was consulted. He underwent initial biopsy on 12/11 and a repeat biopsy on 12/14 to the tissue to be sent for tumor markers. CT of abdomen and pelvis demonstrated no evidence of metastases and CT of the head with and without contrast was negative for brain metastases. The patient was unable to tolerate MRI of the brain due to claustrophobia.  Given percocet for chest pain related to malignancy with stool softener and laxative.    Right lung nodules, patient will be having a PET scan as an outpatient.  Elevated LFTs, Most likely to sepsis and trended down, negative hepatitis panel, no acute finding in right upper quadrant ultrasound.  Tobacco abuse, counseled cessation and gave her prescription for nicotine patch at discharge.  Given Combivent inhaler at discharge for symptom management.    Anemia and thrombocytosis, due to malignancy - Anemia of chronic illness, B12 level is borderline - Started oral vitamin B12   Severe protein calorie malnutrition - Advised him to use supplements  Discharge Instructions  Discharge Instructions    Call MD for:  difficulty breathing, headache or visual disturbances    Complete by:  As directed    Call MD for:  extreme fatigue    Complete by:  As directed    Call MD for:  hives  Complete by:  As directed    Call MD for:  persistant dizziness or light-headedness    Complete by:  As directed    Call MD for:  persistant nausea and vomiting    Complete by:  As directed    Call MD for:  severe uncontrolled pain     Complete by:  As directed    Call MD for:  temperature >100.4    Complete by:  As directed    Diet - low sodium heart healthy    Complete by:  As directed    Increase activity slowly    Complete by:  As directed        Medication List    TAKE these medications   albuterol-ipratropium 18-103 MCG/ACT inhaler Commonly known as:  COMBIVENT Inhale 1 puff into the lungs every 4 (four) hours as needed for wheezing or shortness of breath.   bisacodyl 5 MG EC tablet Commonly known as:  bisacodyl Take 1 tablet (5 mg total) by mouth daily as needed for moderate constipation.   chlorpheniramine-HYDROcodone 10-8 MG/5ML Suer Commonly known as:  TUSSIONEX Take 5 mLs by mouth every 12 (twelve) hours as needed for cough.   cyanocobalamin 1000 MCG tablet Take 1 tablet (1,000 mcg total) by mouth daily.   nicotine 21 mg/24hr patch Commonly known as:  NICODERM CQ - dosed in mg/24 hours Place 1 patch (21 mg total) onto the skin daily.   oxyCODONE-acetaminophen 5-325 MG tablet Commonly known as:  PERCOCET/ROXICET Take 1-2 tablets by mouth every 6 (six) hours as needed for moderate pain. What changed:  how much to take  when to take this  reasons to take this   polyethylene glycol powder powder Commonly known as:  MIRALAX Take 17 g by mouth once.      Follow-up Information    Douglas Kelly., MD. Schedule an appointment as soon as possible for a visit in 1 week(s).   Specialty:  Oncology Contact information: [place] [address] [phone]        Douglas Pita, MD Follow up.   Specialty:  Radiation Oncology Why:  scheduled radiation treatment Contact information: [place] [address] [phone]          No Known Allergies  Consultations: PC CM Oncology, Dr. Julien Kelly Radiation oncology, Dr. Tammi Kelly    Procedures/Studies: Dg Chest 2 View  Result Date: 04/18/2016 CLINICAL DATA:  April 15, 2016 EXAM: CHEST  2  VIEW COMPARISON:  None. FINDINGS: Near complete collapse of left lung with minimal aeration in the left apex, similar in the interval. Abrupt cut off of the left mainstem bronchus, as appreciated on recent CT. The heart mediastinum are shifted into the left chest. Hyperexpansion of the right lung is compensatory. The right lung is otherwise clear. No other interval changes. IMPRESSION: Near complete collapse left lung with abrupt cut off of left mainstem bronchus, similar in the interval. Electronically Signed   By: Dorise Bullion III M.D   On: 04/18/2016 09:05   Dg Chest 2 View  Result Date: 04/15/2016 CLINICAL DATA:  Left-sided chest pain and dyspnea x5 months. History of pneumonia 7 months ago that filled entire left lung. EXAM: CHEST  2 VIEW COMPARISON:  11/03/2012 FINDINGS: There is volume loss with tracheal deviation to the left and near complete whiteout of the left hemithorax. Compensatory hyperinflation of the right lung. Only a few aerated portions of left upper lobe are noted near the apex. The entire heart  is obscured as is the aorta. No suspicious osseous abnormality. IMPRESSION: Near complete whiteout of the left lung with volume loss and mediastinal shift to the left. Compensatory hyperinflation of the normal-appearing right lung. Electronically Signed   By: Ashley Royalty M.D.   On: 04/15/2016 16:19   Ct Head W & Wo Contrast  Result Date: 04/21/2016 CLINICAL DATA:  45 year old male recently diagnosed with left lung mass, suspected stage IV non-small cell lung cancer. Unable to tolerate MRI. Staging. Subsequent encounter. EXAM: CT HEAD WITHOUT AND WITH CONTRAST TECHNIQUE: Contiguous axial images were obtained from the base of the skull through the vertex without and with intravenous contrast CONTRAST:  75 mL ISOVUE-300 IOPAMIDOL (ISOVUE-300) INJECTION 61% COMPARISON:  Limited brain MRI 04/20/2016. Noncontrast head CT 11/03/2012. FINDINGS: Brain: No midline shift, ventriculomegaly, mass effect,  evidence of mass lesion, intracranial hemorrhage or evidence of cortically based acute infarction. Gray-white matter differentiation is within normal limits throughout the brain. No abnormal enhancement identified. Vascular: Calcified atherosclerosis at the skull base. Major intracranial vascular structures are enhancing. Skull: No acute or suspicious osseous lesion. Sinuses/Orbits: Previous left mastoidectomy appears stable. Trace right mastoid effusion is stable. Visible paranasal sinuses remain well pneumatized. Other: Visualized orbits and scalp soft tissues are within normal limits. IMPRESSION: Normal CT appearance of the brain. No metastatic disease identified. Electronically Signed   By: Genevie Ann M.D.   On: 04/21/2016 09:58   Ct Angio Chest Pe W And/or Wo Contrast  Result Date: 04/15/2016 CLINICAL DATA:  Cough for 5 months with dyspnea and weight loss. Left-sided chest pain. Current smoker. EXAM: CT ANGIOGRAPHY CHEST WITH CONTRAST TECHNIQUE: Multidetector CT imaging of the chest was performed using the standard protocol during bolus administration of intravenous contrast. Multiplanar CT image reconstructions and MIPs were obtained to evaluate the vascular anatomy. CONTRAST:  100 cc of Isovue 370 COMPARISON:  Same day CXR FINDINGS: Cardiovascular: The heart shifted toward the left hemithorax secondary to left lung collapse and volume loss. No acute pulmonary embolus. No aortic aneurysm or dissection. No pericardial effusion. Mediastinum/Nodes: There is prevascular lymphadenopathy measuring up to 12 mm Dejean Tribby axis. No paratracheal, supraclavicular nor axillary lymphadenopathy. Lungs/Pleura: There is volume loss secondary to atelectasis and left lung collapse of the left upper lobe, lingula and left lower lobe. Tubular branching air like opacities are seen within the left lower consistent with air within bronchi and bronchioles. No confluent areas of cavitation noted to suggest pulmonary necrosis on current  exam. Soft tissue densities are noted abruptly terminating the distal left main stem bronchus. An endoluminal mass or mucous causing the left lung collapse is not excluded. Faint ground-glass 7 mm in average and 5 mm in average nodular densities in the right upper lobe are noted, series 4 image 48 and 49. No effusion or pneumothorax on the right. Upper Abdomen: No acute abnormality within the abdomen. No enhancing hepatic mass. No definite adrenal nodule to the extent that they are included. Musculoskeletal: Nonacute Review of the MIP images confirms the above findings. IMPRESSION: 1. Left lung atelectasis/collapse with volume loss and shift of the mediastinum to the left. Soft tissue densities in the distal mainstem bronchus may be secondary to is inspissation. Endobronchial mass or lesion is not entirely excluded. No definite evidence of pulmonary necrosis. Mild prevascular lymphadenopathy. 2. No acute pulmonary embolus. 3. 7 mm and 5 mm right upper lobe ground-glass and nodular opacities. Initial follow-up with CT at 6-12 months is recommended to confirm persistence. If persistent, repeat CT is recommended every  2 years until 5 years of stability has been established. This recommendation follows the consensus statement: Guidelines for Management of Incidental Pulmonary Nodules Detected on CT Images: From the Fleischner Society 2017; Radiology 2017; 284:228-243. Electronically Signed   By: Ashley Royalty M.D.   On: 04/15/2016 18:17   Mr Brain Wo Contrast  Result Date: 04/20/2016 CLINICAL DATA:  Lung mass.  Rule out metastatic disease EXAM: MRI HEAD WITHOUT CONTRAST TECHNIQUE: Multiplanar, multiecho pulse sequences of the brain and surrounding structures were obtained without intravenous contrast. COMPARISON:  CT head 11/03/2012 FINDINGS: Incomplete study. Sagittal T1 and axial diffusion only were obtained. The patient refused any further imaging. Ventricle size normal. Negative for acute infarct. No large mass  lesion identified. Pituitary not enlarged. Calvarium is intact right IMPRESSION: Incomplete study. There is not adequate information to rule out metastatic disease. No large mass identified. If the patient refuses further MRI scanning with sedation, CT head without with contrast is suggested for staging. Electronically Signed   By: Franchot Gallo M.D.   On: 04/20/2016 06:59   Ct Abdomen Pelvis W Contrast  Result Date: 04/19/2016 CLINICAL DATA:  45 year old male presents with cough weight loss and poor appetite. EXAM: CT ABDOMEN AND PELVIS WITH CONTRAST TECHNIQUE: Multidetector CT imaging of the abdomen and pelvis was performed using the standard protocol following bolus administration of intravenous contrast. CONTRAST:  143m ISOVUE-300 IOPAMIDOL (ISOVUE-300) INJECTION 61% COMPARISON:  Chest CT dated 04/15/2016 FINDINGS: Lower chest: There is complete consolidative changes of the left lung base as seen on the prior CT with associated volume loss and compensatory expansion of the right lung. There is shift of the mediastinum to the to the left hemithorax as seen on the prior CT. These findings are better evaluated on the chest CT of 04/15/2016. Correlation with clinical exam and follow-up to resolution recommended. The visualized right lung base is clear. There is no intra-abdominal free air.  No free fluid noted. Hepatobiliary: No focal liver abnormality is seen. No gallstones, gallbladder wall thickening, or biliary dilatation. Pancreas: Unremarkable. No pancreatic ductal dilatation or surrounding inflammatory changes. Spleen: Normal in size without focal abnormality. Adrenals/Urinary Tract: Adrenal glands are unremarkable. Kidneys are normal, without renal calculi, focal lesion, or hydronephrosis. Bladder is unremarkable. Stomach/Bowel: There is moderate amount of stool throughout the colon. There is no evidence of bowel obstruction or active inflammation. Normal appendix. Vascular/Lymphatic: No significant  vascular findings are present. No enlarged abdominal or pelvic lymph nodes. Reproductive: The prostate and seminal vesicles are grossly unremarkable. Other: There is mild diffuse subcutaneous edema. No fluid collection. Musculoskeletal: There is diffuse disc bulge with disc desiccation and vacuum phenomena at L5-S1. Mild chronic bilateral sacroiliitis. The osseous structures are otherwise intact. IMPRESSION: No acute intra-abdominopelvic pathology. No findings suspicious of malignancy within the abdomen or pelvis. Complete consolidative changes of the visualized left lung base with volume loss and shift of the mediastinum into the left hemithorax as seen on the prior CT. Correlation with clinical exam and follow-up to resolution is recommended. Electronically Signed   By: AAnner CreteM.D.   On: 04/19/2016 21:53   [place]domen Limited Ruq  Result Date: 04/18/2016 CLINICAL DATA:  Elevated LFTs. EXAM: [place]DOM[place] LIMITED - RIGHT UPPER QUADRANT COMPARISON:  None. FINDINGS: Gallbladder: No gallstones or wall thickening visualized. No sonographic Murphy sign noted by sonographer. Two small nonshadowing and non mobile echogenic foci along the gallbladder wall near the gallbladder neck with the largest measuring 4 mm likely polyps. Common bile duct: Diameter: 1.7  mm. Liver: No focal lesion identified. Within normal limits in parenchymal echogenicity. IMPRESSION: No acute hepatobiliary disease. Two probable small polyps near the gallbladder base with the larger measuring 4 mm. Electronically Signed   By: Marin Olp M.D.   On: 04/18/2016 14:40    12/11: Bronchoscopy with biopsy of lung mass 12/14: Intubation with bronchoscopy and biopsy of lung mass. Extubated on the same day   Subjective: Patient states he feels much better and would like to go home today. He still has some cough but it is improved with cough medication. Denies nausea, vomiting. His appetite is improved some. He is anxious about starting  his radiation and chemotherapy because he does not have health insurance.  Discharge Exam: Vitals:   04/22/16 2103 04/23/16 0608  BP: 121/70 108/70  Pulse: (!) 104 96  Resp: 16 20  Temp: 97.8 F (36.6 C) 97.8 F (36.6 C)   Vitals:   04/22/16 1900 04/22/16 2103 04/23/16 0608 04/23/16 0843  BP:  121/70 108/70   Pulse:  (!) 104 96   Resp:  16 20   Temp: 97.9 F (36.6 C) 97.8 F (36.6 C) 97.8 F (36.6 C)   TempSrc: Oral Oral Oral   SpO2:  98% 100% 91%  Weight:      Height:        General exam:  Adult Male, Cachectic.  No acute distress.  HEENT:  NCAT, MMM Respiratory system:  Diminished over the left lung fields to the midback. Breath sounds present but diminished at the apex on the left side. No wheezes, rales, rhonchi Cardiovascular system: Regular rate and rhythm, normal S1/S2. No murmurs, rubs, gallops or clicks.  Warm extremities Gastrointestinal system: Normal active bowel sounds, soft, nondistended, nontender. MSK:  Normal tone and bulk, no lower extremity edema    The results of significant diagnostics from this hospitalization (including imaging, microbiology, ancillary and laboratory) are listed below for reference.     Microbiology: Recent Results (from the past 240 hour(s))  Blood culture (routine x 2)     Status: None   Collection Time: 04/15/16  4:40 PM  Result Value Ref Range Status   Specimen Description BLOOD LEFT HAND  Final   Special Requests BOTTLES DRAWN AEROBIC AND ANAEROBIC 5CC EACH  Final   Culture   Final    NO GROWTH 5 DAYS Performed at [facility]    Report Status 04/20/2016 FINAL  Final  Blood culture (routine x 2)     Status: None   Collection Time: 04/15/16  4:55 PM  Result Value Ref Range Status   Specimen Description BLOOD RIGHT HAND  Final   Special Requests BOTTLES DRAWN AEROBIC AND ANAEROBIC 5CC EACH  Final   Culture   Final    NO GROWTH 5 DAYS Performed at [facility]    Report Status 04/20/2016 FINAL  Final   Urine culture     Status: None   Collection Time: 04/15/16  7:08 PM  Result Value Ref Range Status   Specimen Description URINE, RANDOM  Final   Special Requests NONE  Final   Culture NO GROWTH Performed at [facility]   Final   Report Status 04/17/2016 FINAL  Final  Culture, sputum-assessment     Status: None   Collection Time: 04/15/16 10:01 PM  Result Value Ref Range Status   Specimen Description SPUTUM  Final   Special Requests NONE  Final   Sputum evaluation   Final    MICROSCOPIC FINDINGS  SUGGEST THAT THIS SPECIMEN IS NOT REPRESENTATIVE OF LOWER RESPIRATORY SECRETIONS. PLEASE RECOLLECT. NOTIFIED RN Thana Farr @[phone]  ZANDO,C    Report Status 04/16/2016 FINAL  Final  Culture, blood (routine x 2) Call MD if unable to obtain prior to antibiotics being given     Status: None   Collection Time: 04/15/16 10:52 PM  Result Value Ref Range Status   Specimen Description BLOOD RIGHT ARM  Final   Special Requests BOTTLES DRAWN AEROBIC AND ANAEROBIC 6 CC  Final   Culture   Final    NO GROWTH 5 DAYS Performed at [facility]    Report Status 04/21/2016 FINAL  Final  Culture, blood (routine x 2) Call MD if unable to obtain prior to antibiotics being given     Status: None   Collection Time: 04/15/16 10:52 PM  Result Value Ref Range Status   Specimen Description BLOOD RIGHT HAND  Final   Special Requests BOTTLES DRAWN AEROBIC AND ANAEROBIC 6 CC  Final   Culture   Final    NO GROWTH 5 DAYS Performed at [facility]    Report Status 04/21/2016 FINAL  Final  Culture, expectorated sputum-assessment     Status: None   Collection Time: 04/17/16  5:58 AM  Result Value Ref Range Status   Specimen Description SPUTUM  Final   Special Requests NONE  Final   Sputum evaluation   Final    THIS SPECIMEN IS ACCEPTABLE. RESPIRATORY CULTURE REPORT TO FOLLOW.   Report Status 04/17/2016 FINAL  Final  Culture, respiratory (NON-Expectorated)     Status: None   Collection Time:  04/17/16  5:58 AM  Result Value Ref Range Status   Specimen Description SPUTUM  Final   Special Requests NONE  Final   Gram Stain   Final    ABUNDANT WBC PRESENT, PREDOMINANTLY PMN FEW SQUAMOUS EPITHELIAL CELLS PRESENT RARE GRAM POSITIVE COCCI IN PAIRS RARE GRAM NEGATIVE COCCI IN PAIRS    Culture   Final    Consistent with normal respiratory flora. Performed at [facility]    Report Status 04/19/2016 FINAL  Final  MRSA PCR Screening     Status: None   Collection Time: 04/21/16  6:20 PM  Result Value Ref Range Status   MRSA by PCR NEGATIVE NEGATIVE Final    Comment:        The GeneXpert MRSA Assay (FDA approved for NASAL specimens only), is one component of a comprehensive MRSA colonization surveillance program. It is not intended to diagnose MRSA infection nor to guide or monitor treatment for MRSA infections.      Labs: BNP (last 3 results) No results for input(s): BNP in the last 8760 hours. Basic Metabolic Panel:  Recent Labs Lab 04/19/16 0520 04/20/16 0534 04/21/16 0530 04/22/16 0506 04/23/16 0357  NA 137 135 136 135 137  K 4.8 4.2 4.8 4.7 5.0  CL 101 102 101 99* 98*  CO2 28 26 27 27 30   GLUCOSE 114* 142* 110* 111* 119*  BUN 10 12 12 13 18   CREATININE 0.72 0.67 0.75 0.82 0.82  CALCIUM 8.6* 8.4* 8.9 9.3 9.4   Liver Function Tests:  Recent Labs Lab 04/17/16 0530 04/18/16 0525 04/20/16 0534  AST 81* 69* 41  ALT 205* 204* 117*  ALKPHOS 147* 139* 107  BILITOT 0.3 0.4 0.6  PROT 6.1* 6.1* 6.4*  ALBUMIN 2.2* 2.2* 2.4*   No results for input(s): LIPASE, AMYLASE in the last 168 hours. No results for input(s): AMMONIA in  the last 168 hours. CBC:  Recent Labs Lab 04/19/16 0520 04/20/16 0534 04/21/16 0530 04/22/16 0506 04/23/16 0357  WBC 12.5* 13.3* 12.1* 14.2* 12.8*  HGB 10.1* 10.2* 10.3* 11.2* 11.3*  HCT 31.7* 31.7* 32.1* 35.1* 34.9*  MCV 93.5 93.0 93.3 93.9 92.6  PLT 677* 679* 669* 773* 754*   Cardiac Enzymes: No results for  input(s): CKTOTAL, CKMB, CKMBINDEX, TROPONINI in the last 168 hours. BNP: Invalid input(s): POCBNP CBG: No results for input(s): GLUCAP in the last 168 hours. D-Dimer No results for input(s): DDIMER in the last 72 hours. Hgb A1c No results for input(s): HGBA1C in the last 72 hours. Lipid Profile No results for input(s): CHOL, HDL, LDLCALC, TRIG, CHOLHDL, LDLDIRECT in the last 72 hours. Thyroid function studies No results for input(s): TSH, T4TOTAL, T3FREE, THYROIDAB in the last 72 hours.  Invalid input(s): FREET3 Anemia work up No results for input(s): VITAMINB12, FOLATE, FERRITIN, TIBC, IRON, RETICCTPCT in the last 72 hours. Urinalysis    Component Value Date/Time   COLORURINE YELLOW 04/15/2016 1908   APPEARANCEUR CLEAR 04/15/2016 1908   LABSPEC >1.046 (H) 04/15/2016 1908   PHURINE 7.5 04/15/2016 1908   GLUCOSEU NEGATIVE 04/15/2016 1908   HGBUR NEGATIVE 04/15/2016 1908   BILIRUBINUR NEGATIVE 04/15/2016 1908   KETONESUR NEGATIVE 04/15/2016 1908   PROTEINUR NEGATIVE 04/15/2016 1908   NITRITE NEGATIVE 04/15/2016 1908   LEUKOCYTESUR NEGATIVE 04/15/2016 1908   Sepsis Labs Invalid input(s): PROCALCITONIN,  WBC,  LACTICIDVEN   Time coordinating discharge: Over 30 minutes  SIGNED:   Janece Canterbury, MD  [facility] 04/23/2016, 12:39 PM Pager   If 7PM-7AM, please contact night-coverage [email or web address] Password [email or web address]

## 2016-04-26 ENCOUNTER — Ambulatory Visit
Admission: RE | Admit: 2016-04-26 | Discharge: 2016-04-26 | Disposition: A | Discharge status: Home / Self Care | Payer: Medicaid Other | Source: Ambulatory Visit | Attending: Radiation Oncology | Admitting: Radiation Oncology

## 2016-04-26 DIAGNOSIS — C3492 Malignant neoplasm of unspecified part of left bronchus or lung: Secondary | ICD-10-CM | POA: Diagnosis not present

## 2016-04-26 DIAGNOSIS — Z510 Encounter for antineoplastic radiation therapy: Secondary | ICD-10-CM | POA: Diagnosis not present

## 2016-04-27 ENCOUNTER — Telehealth: Payer: Self-pay | Admitting: *Deleted

## 2016-04-27 ENCOUNTER — Encounter: Payer: Self-pay | Admitting: *Deleted

## 2016-04-27 ENCOUNTER — Telehealth: Payer: Self-pay | Admitting: Pulmonary Disease

## 2016-04-27 ENCOUNTER — Ambulatory Visit
Admission: RE | Admit: 2016-04-27 | Discharge: 2016-04-27 | Disposition: A | Discharge status: Home / Self Care | Payer: Medicaid Other | Source: Ambulatory Visit | Attending: Radiation Oncology | Admitting: Radiation Oncology

## 2016-04-27 DIAGNOSIS — Z510 Encounter for antineoplastic radiation therapy: Secondary | ICD-10-CM | POA: Diagnosis not present

## 2016-04-27 DIAGNOSIS — C3492 Malignant neoplasm of unspecified part of left bronchus or lung: Secondary | ICD-10-CM

## 2016-04-27 NOTE — Telephone Encounter (Signed)
Called and spoke to [place] with the lab and was advised that they did not call, cytology called - [phone].  [facility] cytology, they close at 5. Will call back on 12.20.17.

## 2016-04-27 NOTE — Telephone Encounter (Signed)
Oncology Nurse Navigator Documentation  Oncology Nurse Navigator Flowsheets 04/27/2016  Navigator Location [facility]-  Navigator Encounter Type Telephone/I called central scheduling to schedule him for PET.  I then called patient and gave him the appt on 05/11/16 arrive at 7:30 and pre-procedure instructions.  He verbalized understanding of appt   Telephone Outgoing Call  Treatment Phase Treatment  Barriers/Navigation Needs Coordination of Care  Interventions Coordination of Care  Coordination of Care Appts;Radiology  Acuity Level 1  Acuity Level 1 Minimal follow up required  Time Spent with Patient 15

## 2016-04-27 NOTE — Progress Notes (Signed)
Oncology Nurse Navigator Documentation  Oncology Nurse Navigator Flowsheets 04/27/2016  Navigator Location [facility]  Navigator Encounter Type Other  Treatment Phase Treatment/per Dr. Julien Nordmann patient needs PET scan and then a hospital follow up with him.  I contacted pre cert coordinator to get PET scan authorized.  Will follow up with scheduling once PET scan is scheduled.   Barriers/Navigation Needs Coordination of Care  Interventions Coordination of Care  Coordination of Care Appts;Radiology  Acuity Level 2  Acuity Level 2 Assistance expediting appointments;Other  Time Spent with Patient 30

## 2016-04-28 ENCOUNTER — Encounter: Payer: Self-pay | Admitting: Radiation Oncology

## 2016-04-28 ENCOUNTER — Telehealth: Payer: Self-pay | Admitting: Internal Medicine

## 2016-04-28 ENCOUNTER — Ambulatory Visit
Admission: RE | Admit: 2016-04-28 | Discharge: 2016-04-28 | Disposition: A | Discharge status: Home / Self Care | Payer: Medicaid Other | Source: Ambulatory Visit | Attending: Radiation Oncology | Admitting: Radiation Oncology

## 2016-04-28 ENCOUNTER — Encounter: Payer: Self-pay | Admitting: *Deleted

## 2016-04-28 VITALS — BP 123/84 | HR 107 | Temp 98.4°F | Resp 18 | Ht 68.0 in | Wt 150.4 lb

## 2016-04-28 DIAGNOSIS — C3492 Malignant neoplasm of unspecified part of left bronchus or lung: Secondary | ICD-10-CM

## 2016-04-28 DIAGNOSIS — Z510 Encounter for antineoplastic radiation therapy: Secondary | ICD-10-CM | POA: Diagnosis not present

## 2016-04-28 MED ORDER — OXYCODONE-ACETAMINOPHEN 5-325 MG PO TABS: 1.0000 | ORAL_TABLET | Freq: Four times a day (QID) | ORAL | 0 refills | Status: DC | PRN

## 2016-04-28 MED ORDER — HYDROCOD POLST-CPM POLST ER 10-8 MG/5ML PO SUER: 5.0000 mL | Freq: Two times a day (BID) | ORAL | 0 refills | Status: DC | PRN

## 2016-04-28 MED ORDER — RADIAPLEXRX EX GEL
Freq: Once | CUTANEOUS | Status: AC
  Administered 2016-04-28: 15:00:00 via TOPICAL

## 2016-04-28 MED FILL — OXYCODONE W/APAP 5/325 TAB: 5-325 | 4 days supply | Qty: 30 | Fill #0

## 2016-04-28 NOTE — Progress Notes (Signed)
  Radiation Oncology         [phone]   Name: CHRLES SELLEY MRN: [id number]   Date: 04/28/2016  DOB: [birth year]     Weekly Radiation Therapy Management    ICD-9-CM ICD-10-CM   1. Adenocarcinoma of left lung, stage 4 (HCC) 162.9 C34.92     Current Dose: 10 Gy  Planned Dose:  66 Gy  Narrative The patient presents for routine under treatment assessment.  Mr. Chaseton Yepiz has received 5 radiation treatments to his left lung. Skin to left chest normal color, mild tanning to upper left shoulder. Radiaplex gel given. Reports fatigue all during the day. Reports coughing clear secretion and having SOB at any time. Denies hair loss.     The patient is without complaint. Set-up films were reviewed. The chart was checked.  Physical Findings  height is '5\' 8"'$  (1.727 m) and weight is 150 lb 6.4 oz (68.2 kg). His oral temperature is 98.4 F (36.9 C). His blood pressure is 123/84 and his pulse is 107 (abnormal). His respiration is 18 and oxygen saturation is 95%. . Weight essentially stable.  No significant changes.  Impression The patient is tolerating radiation.  Plan Continue treatment as planned.  Hydrocodone and Oxycodone refilled      Rodman Key A. Tammi Klippel, M.D.  This document serves as a record of services personally performed by [name], MD. It was created on his behalf by Arlyce Harman, a trained medical scribe. The creation of this record is based on the scribe's personal observations and the provider's statements to them. This document has been checked and approved by the attending provider.

## 2016-04-28 NOTE — Telephone Encounter (Signed)
Attempted to contact Cytology at [phone]. This number is to [facility] not cytology. Called [phone] and asked for Cytology. I was then transferred to Pathology and spoke to [facility]. There is a form that needs to be signed by BQ. The form ONLY NEEDS TO BE SIGNED, NOT FILLED OUT per Tammy. She is going to fax this form over to [place] [facility]s attention. Will route message to her to follow up on.

## 2016-04-28 NOTE — Progress Notes (Signed)
Oncology Nurse Navigator Documentation  Oncology Nurse Navigator Flowsheets 04/28/2016  Navigator Location [facility]  Navigator Encounter Type Other/per Dr. Julien Nordmann, I requested tissue obtained on 04/22/16 [phone]) to be sent for PDL 1 testing from [facility]   Treatment Phase Treatment  Barriers/Navigation Needs Coordination of Care  Interventions Coordination of Care  Coordination of Care Other  Acuity Level 2  Acuity Level 2 Other  Time Spent with Patient 30

## 2016-04-28 NOTE — Progress Notes (Signed)
Financial Counselor--Spoke with patient today--he provides letter of support--qualifies for 400.00 [place] grant--entered into sharepoint

## 2016-04-28 NOTE — Telephone Encounter (Signed)
lvm to inform pt of 05/14/16 appt date/time per LOS

## 2016-04-28 NOTE — Progress Notes (Addendum)
Douglas Kelly has received 5 radiations treatments to his left lung.  Skin to left chest normal color mild tanning to upper left shoulder.  Radiaplex gel given with instructions on how to use the product apply twice a day 4 hours before coming in for his treatment..  Patient teaching given pertinent areas discussed fatigue, skin changes,hair loss,throat changes,coughing and short of breath.  Reports fatigue all during the day, coughing clear secretion and having SOB at any time.  Denies hair loss.  See documentation in the education area of chart. Wt Readings from Last 3 Encounters:  04/28/16 150 lb 6.4 oz (68.2 kg)  04/21/16 151 lb 7.3 oz (68.7 kg)  11/03/12 148 lb (67.1 kg)  BP 123/84   Pulse (!) 107   Temp 98.4 F (36.9 C) (Oral)   Resp 18   Ht '5\' 8"'$  (1.727 m)   Wt 150 lb 6.4 oz (68.2 kg)   SpO2 95%   BMI 22.87 kg/m

## 2016-04-29 ENCOUNTER — Ambulatory Visit
Admission: RE | Admit: 2016-04-29 | Discharge: 2016-04-29 | Disposition: A | Discharge status: Home / Self Care | Payer: Medicaid Other | Source: Ambulatory Visit | Attending: Radiation Oncology | Admitting: Radiation Oncology

## 2016-04-29 ENCOUNTER — Encounter: Payer: Self-pay | Admitting: Radiation Oncology

## 2016-04-29 DIAGNOSIS — Z510 Encounter for antineoplastic radiation therapy: Secondary | ICD-10-CM | POA: Diagnosis not present

## 2016-04-29 NOTE — Telephone Encounter (Signed)
Form signed by BQ and faxed back to [facility].  Nothing further needed.

## 2016-04-30 ENCOUNTER — Ambulatory Visit
Admission: RE | Admit: 2016-04-30 | Discharge: 2016-04-30 | Disposition: A | Discharge status: Home / Self Care | Payer: Medicaid Other | Source: Ambulatory Visit | Attending: Radiation Oncology | Admitting: Radiation Oncology

## 2016-04-30 ENCOUNTER — Ambulatory Visit
Admission: RE | Admit: 2016-04-30 | Discharge: 2016-04-30 | Disposition: A | Discharge status: Home / Self Care | Payer: Self-pay | Source: Ambulatory Visit | Attending: Radiation Oncology | Admitting: Radiation Oncology

## 2016-04-30 VITALS — BP 111/72 | HR 110 | Temp 98.1°F | Resp 18 | Wt 150.0 lb

## 2016-04-30 DIAGNOSIS — C3492 Malignant neoplasm of unspecified part of left bronchus or lung: Secondary | ICD-10-CM

## 2016-04-30 DIAGNOSIS — Z510 Encounter for antineoplastic radiation therapy: Secondary | ICD-10-CM | POA: Diagnosis not present

## 2016-04-30 NOTE — Progress Notes (Signed)
Weight and vitals stable. Reports continued left shoulder pain. Reports SOB is slightly improved. Reports a persistent dry cough. Reports he plans to pick up Tussinex from his pharmacy today. Reports using radiaplex bid as directed. Denies pain or difficulty associated with swallowing. Reports mild fatigue.   BP 111/72 (BP Location: Left Arm, Patient Position: Sitting, Cuff Size: Normal)   Pulse (!) 110   Temp 98.1 F (36.7 C) (Oral)   Resp 18   Wt 150 lb (68 kg)   SpO2 100%   BMI 22.81 kg/m  Wt Readings from Last 3 Encounters:  04/30/16 150 lb (68 kg)  04/28/16 150 lb 6.4 oz (68.2 kg)  04/21/16 151 lb 7.3 oz (68.7 kg)

## 2016-04-30 NOTE — Progress Notes (Signed)
  Radiation Oncology         [phone]   Name: Douglas Kelly MRN: [id number]   Date: 04/30/2016  DOB: 1970-12-09     Weekly Radiation Therapy Management    ICD-9-CM ICD-10-CM   1. Adenocarcinoma of left lung, stage 4 (HCC) 162.9 C34.92     Current Dose: 14 Gy  Planned Dose:  66 Gy  Narrative The patient presents for routine under treatment assessment.  Mr. [name] has received radiation treatments to his left lung.  The patient is without complaint. Set-up films were reviewed. The chart was checked.  Physical Findings  weight is 150 lb (68 kg). His oral temperature is 98.1 F (36.7 C). His blood pressure is 111/72 and his pulse is 110 (abnormal). His respiration is 18 and oxygen saturation is 100%. . Weight essentially stable.  No significant changes.  Impression The patient is tolerating radiation.  Plan Continue treatment as planned.      Sheral Apley Tammi Klippel, M.D.  This document serves as a record of services personally performed by [name], MD. It was created on his behalf by Maryla Morrow, a trained medical scribe. The creation of this record is based on the scribe's personal observations and the provider's statements to them. This document has been checked and approved by the attending provider.

## 2016-05-04 ENCOUNTER — Other Ambulatory Visit: Payer: Self-pay | Admitting: Radiation Oncology

## 2016-05-04 ENCOUNTER — Ambulatory Visit
Admission: RE | Admit: 2016-05-04 | Discharge: 2016-05-04 | Disposition: A | Discharge status: Home / Self Care | Payer: Medicaid Other | Source: Ambulatory Visit | Attending: Radiation Oncology | Admitting: Radiation Oncology

## 2016-05-04 DIAGNOSIS — Z510 Encounter for antineoplastic radiation therapy: Secondary | ICD-10-CM | POA: Diagnosis not present

## 2016-05-04 DIAGNOSIS — C3492 Malignant neoplasm of unspecified part of left bronchus or lung: Secondary | ICD-10-CM

## 2016-05-04 MED ORDER — OXYCODONE-ACETAMINOPHEN 5-325 MG PO TABS: 1.0000 | ORAL_TABLET | Freq: Four times a day (QID) | ORAL | 0 refills | Status: DC | PRN

## 2016-05-04 MED FILL — OXYCODONE W/APAP 5/325 TAB: 5-325 | 15 days supply | Qty: 120 | Fill #0

## 2016-05-05 ENCOUNTER — Ambulatory Visit
Admission: RE | Admit: 2016-05-05 | Discharge: 2016-05-05 | Disposition: A | Discharge status: Home / Self Care | Payer: Medicaid Other | Source: Ambulatory Visit | Attending: Radiation Oncology | Admitting: Radiation Oncology

## 2016-05-05 DIAGNOSIS — Z510 Encounter for antineoplastic radiation therapy: Secondary | ICD-10-CM | POA: Diagnosis not present

## 2016-05-06 ENCOUNTER — Ambulatory Visit
Admission: RE | Admit: 2016-05-06 | Discharge: 2016-05-06 | Disposition: A | Discharge status: Home / Self Care | Payer: Medicaid Other | Source: Ambulatory Visit | Attending: Radiation Oncology | Admitting: Radiation Oncology

## 2016-05-06 ENCOUNTER — Encounter (HOSPITAL_COMMUNITY): Payer: Self-pay

## 2016-05-06 DIAGNOSIS — Z510 Encounter for antineoplastic radiation therapy: Secondary | ICD-10-CM | POA: Diagnosis not present

## 2016-05-07 ENCOUNTER — Ambulatory Visit
Admission: RE | Admit: 2016-05-07 | Discharge: 2016-05-07 | Disposition: A | Discharge status: Home / Self Care | Payer: Self-pay | Source: Ambulatory Visit | Attending: Radiation Oncology | Admitting: Radiation Oncology

## 2016-05-07 ENCOUNTER — Other Ambulatory Visit: Payer: Self-pay | Admitting: Emergency Medicine

## 2016-05-07 ENCOUNTER — Other Ambulatory Visit: Payer: Self-pay | Admitting: Internal Medicine

## 2016-05-07 ENCOUNTER — Ambulatory Visit
Admission: RE | Admit: 2016-05-07 | Discharge: 2016-05-07 | Disposition: A | Discharge status: Home / Self Care | Payer: Medicaid Other | Source: Ambulatory Visit | Attending: Radiation Oncology | Admitting: Radiation Oncology

## 2016-05-07 VITALS — BP 106/82 | HR 86 | Temp 98.2°F | Resp 18 | Wt 151.6 lb

## 2016-05-07 DIAGNOSIS — C3492 Malignant neoplasm of unspecified part of left bronchus or lung: Secondary | ICD-10-CM

## 2016-05-07 DIAGNOSIS — Z510 Encounter for antineoplastic radiation therapy: Secondary | ICD-10-CM | POA: Diagnosis not present

## 2016-05-07 MED ORDER — RADIAPLEXRX EX GEL
Freq: Once | CUTANEOUS | Status: AC
Start: 2016-05-07 — End: 2016-05-07
  Administered 2016-05-07: 15:00:00 via TOPICAL

## 2016-05-07 MED ORDER — OXYCODONE HCL 5 MG PO TABS: 10.0000 mg | ORAL_TABLET | ORAL | 0 refills | Status: DC | PRN

## 2016-05-07 MED FILL — oxyCODONE HCL 5 MG TABS: 5 | 8 days supply | Qty: 90 | Fill #0

## 2016-05-07 NOTE — Progress Notes (Signed)
Weight and vitals stable. Reports intermittent sharp spine pain 8 on a scale of 0-10. Requesting "something stronger" than Percocet 5/325 mg. Schedule for PET scan January 2nd. Reports overall he is breathing better with less SOB. Reports a persistent dry cough. Reports new onset of mild discomfort associated with swallowing.   BP 106/82 (BP Location: Left Arm, Patient Position: Sitting, Cuff Size: Normal)   Pulse 86   Temp 98.2 F (36.8 C) (Oral)   Resp 18   Wt 151 lb 9.6 oz (68.8 kg)   SpO2 100%   BMI 23.05 kg/m  Wt Readings from Last 3 Encounters:  05/07/16 151 lb 9.6 oz (68.8 kg)  04/30/16 150 lb (68 kg)  04/28/16 150 lb 6.4 oz (68.2 kg)

## 2016-05-07 NOTE — Progress Notes (Signed)
  Radiation Oncology         [phone]   Name: Douglas Kelly MRN: [id number]   Date: 05/07/2016  DOB: [birth year]     Weekly Radiation Therapy Management  No diagnosis found.  Current Dose: 22 Gy  Planned Dose:  66 Gy  Narrative The patient presents for routine under treatment assessment.  Weight and vitals stable. Reports intermittent sharp spine pain 8 on a scale of 0-10. Requesting "something stronger" than Percocet 5/325 mg. Reports overall he is breathing better with less SOB. Reports a persistent dry cough. Reports new onset of mild discomfort associated with swallowing.   The patient is without complaint. Set-up films were reviewed. The chart was checked.  Physical Findings  weight is 151 lb 9.6 oz (68.8 kg). His oral temperature is 98.2 F (36.8 C). His blood pressure is 106/82 and his pulse is 86. His respiration is 18 and oxygen saturation is 100%. . Weight essentially stable.  No significant changes.  Impression The patient is tolerating radiation.  Plan Continue treatment as planned. I advised the patient that he could take Percocet every 4 hours instead of every 6 hours, and cautioned him not to take more than 10 Percocet tablets in a day. The patient declined this option, so I will prescribe Oxycodone for pain.     ------------------------------------------------  Douglas Gross, MD, PhD  This document serves as a record of services personally performed by Douglas Rudd, MD. It was created on his behalf by Douglas Kelly, a trained medical scribe. The creation of this record is based on the scribe's personal observations and the provider's statements to them. This document has been checked and approved by the attending provider.

## 2016-05-11 ENCOUNTER — Ambulatory Visit
Admission: RE | Admit: 2016-05-11 | Discharge: 2016-05-11 | Disposition: A | Discharge status: Home / Self Care | Payer: Medicaid Other | Source: Ambulatory Visit | Attending: Radiation Oncology | Admitting: Radiation Oncology

## 2016-05-11 ENCOUNTER — Ambulatory Visit (HOSPITAL_COMMUNITY)
Admission: RE | Admit: 2016-05-11 | Discharge: 2016-05-11 | Disposition: A | Discharge status: Home / Self Care | Payer: Medicaid Other | Source: Ambulatory Visit | Attending: Internal Medicine | Admitting: Internal Medicine

## 2016-05-11 DIAGNOSIS — J189 Pneumonia, unspecified organism: Secondary | ICD-10-CM | POA: Diagnosis not present

## 2016-05-11 DIAGNOSIS — C3492 Malignant neoplasm of unspecified part of left bronchus or lung: Secondary | ICD-10-CM | POA: Diagnosis not present

## 2016-05-11 DIAGNOSIS — Z510 Encounter for antineoplastic radiation therapy: Secondary | ICD-10-CM | POA: Diagnosis not present

## 2016-05-11 LAB — GLUCOSE, CAPILLARY: Glucose-Capillary: 117 mg/dL — ABNORMAL HIGH (ref 65–99)

## 2016-05-11 MED ORDER — FLUDEOXYGLUCOSE F - 18 (FDG) INJECTION
7.4800 | Freq: Once | INTRAVENOUS | Status: AC | PRN
  Administered 2016-05-11: 7.48 via INTRAVENOUS

## 2016-05-12 ENCOUNTER — Ambulatory Visit
Admission: RE | Admit: 2016-05-12 | Discharge: 2016-05-12 | Disposition: A | Discharge status: Home / Self Care | Payer: Medicaid Other | Source: Ambulatory Visit | Attending: Radiation Oncology | Admitting: Radiation Oncology

## 2016-05-12 DIAGNOSIS — Z510 Encounter for antineoplastic radiation therapy: Secondary | ICD-10-CM | POA: Diagnosis not present

## 2016-05-13 ENCOUNTER — Ambulatory Visit
Admission: RE | Admit: 2016-05-13 | Discharge: 2016-05-13 | Disposition: A | Discharge status: Home / Self Care | Payer: Medicaid Other | Source: Ambulatory Visit | Attending: Radiation Oncology | Admitting: Radiation Oncology

## 2016-05-13 DIAGNOSIS — Z510 Encounter for antineoplastic radiation therapy: Secondary | ICD-10-CM | POA: Diagnosis not present

## 2016-05-14 ENCOUNTER — Other Ambulatory Visit: Payer: Self-pay | Admitting: Radiation Oncology

## 2016-05-14 ENCOUNTER — Telehealth: Payer: Self-pay | Admitting: Medical Oncology

## 2016-05-14 ENCOUNTER — Telehealth: Payer: Self-pay | Admitting: *Deleted

## 2016-05-14 ENCOUNTER — Ambulatory Visit
Admission: RE | Admit: 2016-05-14 | Discharge: 2016-05-14 | Disposition: A | Discharge status: Home / Self Care | Payer: Medicaid Other | Source: Ambulatory Visit | Attending: Radiation Oncology | Admitting: Radiation Oncology

## 2016-05-14 ENCOUNTER — Encounter: Payer: Self-pay | Admitting: Internal Medicine

## 2016-05-14 ENCOUNTER — Telehealth: Payer: Self-pay | Admitting: Radiation Oncology

## 2016-05-14 ENCOUNTER — Encounter: Payer: Self-pay | Admitting: *Deleted

## 2016-05-14 ENCOUNTER — Ambulatory Visit (HOSPITAL_BASED_OUTPATIENT_CLINIC_OR_DEPARTMENT_OTHER): Payer: Medicaid Other | Admitting: Internal Medicine

## 2016-05-14 ENCOUNTER — Telehealth: Payer: Self-pay | Admitting: Internal Medicine

## 2016-05-14 ENCOUNTER — Other Ambulatory Visit: Payer: Self-pay | Admitting: Medical Oncology

## 2016-05-14 ENCOUNTER — Ambulatory Visit: Admission: RE | Admit: 2016-05-14 | Payer: Self-pay | Source: Ambulatory Visit | Admitting: Radiation Oncology

## 2016-05-14 VITALS — BP 117/75 | HR 103 | Temp 97.6°F | Resp 17 | Ht 68.0 in | Wt 151.4 lb

## 2016-05-14 DIAGNOSIS — C3492 Malignant neoplasm of unspecified part of left bronchus or lung: Secondary | ICD-10-CM

## 2016-05-14 DIAGNOSIS — Z510 Encounter for antineoplastic radiation therapy: Secondary | ICD-10-CM | POA: Diagnosis not present

## 2016-05-14 DIAGNOSIS — J189 Pneumonia, unspecified organism: Secondary | ICD-10-CM

## 2016-05-14 DIAGNOSIS — Z5111 Encounter for antineoplastic chemotherapy: Secondary | ICD-10-CM

## 2016-05-14 DIAGNOSIS — R112 Nausea with vomiting, unspecified: Secondary | ICD-10-CM

## 2016-05-14 DIAGNOSIS — T451X5A Adverse effect of antineoplastic and immunosuppressive drugs, initial encounter: Principal | ICD-10-CM

## 2016-05-14 HISTORY — DX: Encounter for antineoplastic chemotherapy: Z51.11

## 2016-05-14 MED ORDER — OXYCODONE HCL 5 MG PO TABS: 10.0000 mg | ORAL_TABLET | ORAL | 0 refills | Status: DC | PRN

## 2016-05-14 MED ORDER — PROCHLORPERAZINE MALEATE 10 MG PO TABS: 10.0000 mg | ORAL_TABLET | Freq: Four times a day (QID) | ORAL | 0 refills | Status: DC | PRN

## 2016-05-14 MED FILL — PROCHLORPERAZINE 10 MG TAB: 10 | 7 days supply | Qty: 30 | Fill #0

## 2016-05-14 NOTE — Telephone Encounter (Signed)
Appointments scheduled per 1/5 LOS. Patient given AVS report and calendars with future scheduled appointments. °

## 2016-05-14 NOTE — Progress Notes (Signed)
[place] [phone]   [phone]  OFFICE PROGRESS NOTE  No primary care provider on file. No primary provider on file.  DIAGNOSIS: Stage IIIA (T2a, N2, M0) non-small cell lung cancer, squamous cell carcinoma diagnosed in December 2017 with almost complete collapse of the left lung.  PRIOR THERAPY: None  CURRENT THERAPY: Concurrent chemoradiation with weekly carboplatin for AUC of 2 and paclitaxel 45 MG/M2. The patient is status post 2 weeks of radiation.  INTERVAL HISTORY: Douglas Kelly 46 y.o. male returns to the clinic today for hospital follow-up visit accompanied by his girlfriend. The patient is feeling much better compared to a few weeks ago when I saw him in the hospital. He is currently undergoing palliative radiotherapy to the central obstructing left lung mass. He continues to have shortness breath with exertion and mild cough. He denied having any chest pain or hemoptysis. He lost few pounds recently. He denied having any fever or chills. He has no nausea, vomiting, diarrhea or constipation. He had several studies performed recently including repeat biopsy of the central left lung mass and the final pathology was consistent with squamous cell carcinoma but there was insufficient material for PDL 1 testing. The patient also has a PET scan performed recently and he is here for evaluation and discussion of his scan results and treatment options.  MEDICAL HISTORY: Past Medical History:  Diagnosis Date  . Pneumonia     ALLERGIES:  has No Known Allergies.  MEDICATIONS:  Current Outpatient Prescriptions  Medication Sig Dispense Refill  . albuterol-ipratropium (COMBIVENT) 18-103 MCG/ACT inhaler Inhale 1 puff into the lungs every 4 (four) hours as needed for wheezing or shortness of breath. 1 Inhaler 0  . bisacodyl (BISACODYL) 5 MG EC tablet Take 1 tablet (5 mg total) by mouth daily as needed for moderate constipation. (Patient not taking:  Reported on 05/07/2016) 30 tablet 0  . chlorpheniramine-HYDROcodone (TUSSIONEX) 10-8 MG/5ML SUER Take 5 mLs by mouth every 12 (twelve) hours as needed for cough. (Patient not taking: Reported on 05/07/2016) 140 mL 0  . hyaluronate sodium (RADIAPLEXRX) GEL Apply 1 application topically 2 (two) times daily.    . nicotine (NICODERM CQ - DOSED IN MG/24 HOURS) 21 mg/24hr patch Place 1 patch (21 mg total) onto the skin daily. (Patient not taking: Reported on 05/07/2016) 28 patch 0  . oxyCODONE (OXY IR/ROXICODONE) 5 MG immediate release tablet Take 2 tablets (10 mg total) by mouth every 4 (four) hours as needed for severe pain. 90 tablet 0  . vitamin B-12 1000 MCG tablet Take 1 tablet (1,000 mcg total) by mouth daily. 30 tablet 0   No current facility-administered medications for this visit.     SURGICAL HISTORY:  Past Surgical History:  Procedure Laterality Date  . VIDEO BRONCHOSCOPY Bilateral 04/19/2016   Procedure: VIDEO BRONCHOSCOPY WITHOUT FLUORO;  Surgeon: Juanito Doom, MD;  Location: [facility];  Service: Cardiopulmonary;  Laterality: Bilateral;    REVIEW OF SYSTEMS:  Constitutional: positive for fatigue and weight loss Eyes: negative Ears, nose, mouth, throat, and face: negative Respiratory: positive for chronic bronchitis, cough and dyspnea on exertion Cardiovascular: negative Gastrointestinal: negative Genitourinary:negative Integument/breast: negative Hematologic/lymphatic: negative Musculoskeletal:negative Neurological: negative Behavioral/Psych: negative Endocrine: negative Allergic/Immunologic: negative   PHYSICAL EXAMINATION: General appearance: alert, cooperative, fatigued and no distress Head: Normocephalic, without obvious abnormality, atraumatic Neck: no adenopathy, no JVD, supple, symmetrical, trachea midline and thyroid not enlarged, symmetric, no tenderness/mass/nodules Lymph nodes: Cervical, supraclavicular, and axillary nodes normal.  Resp: diminished breath  sounds bilaterally and dullness to percussion bilaterally Back: symmetric, no curvature. ROM normal. No CVA tenderness. Cardio: regular rate and rhythm, S1, S2 normal, no murmur, click, rub or gallop GI: soft, non-tender; bowel sounds normal; no masses,  no organomegaly Extremities: extremities normal, atraumatic, no cyanosis or edema Neurologic: Alert and oriented X 3, normal strength and tone. Normal symmetric reflexes. Normal coordination and gait  ECOG PERFORMANCE STATUS: 1 - Symptomatic but completely ambulatory  There were no vitals taken for this visit.  LABORATORY DATA: Lab Results  Component Value Date   WBC 12.8 (H) 04/23/2016   HGB 11.3 (L) 04/23/2016   HCT 34.9 (L) 04/23/2016   MCV 92.6 04/23/2016   PLT 754 (H) 04/23/2016      Chemistry      Component Value Date/Time   NA 137 04/23/2016 0357   K 5.0 04/23/2016 0357   CL 98 (L) 04/23/2016 0357   CO2 30 04/23/2016 0357   BUN 18 04/23/2016 0357   CREATININE 0.82 04/23/2016 0357      Component Value Date/Time   CALCIUM 9.4 04/23/2016 0357   ALKPHOS 107 04/20/2016 0534   AST 41 04/20/2016 0534   ALT 117 (H) 04/20/2016 0534   BILITOT 0.6 04/20/2016 0534       RADIOGRAPHIC STUDIES: Dg Chest 2 View  Result Date: 04/18/2016 CLINICAL DATA:  April 15, 2016 EXAM: CHEST  2 VIEW COMPARISON:  None. FINDINGS: Near complete collapse of left lung with minimal aeration in the left apex, similar in the interval. Abrupt cut off of the left mainstem bronchus, as appreciated on recent CT. The heart mediastinum are shifted into the left chest. Hyperexpansion of the right lung is compensatory. The right lung is otherwise clear. No other interval changes. IMPRESSION: Near complete collapse left lung with abrupt cut off of left mainstem bronchus, similar in the interval. Electronically Signed   By: Dorise Bullion III M.D   On: 04/18/2016 09:05   Dg Chest 2 View  Result Date: 04/15/2016 CLINICAL DATA:  Left-sided chest pain and  dyspnea x5 months. History of pneumonia 7 months ago that filled entire left lung. EXAM: CHEST  2 VIEW COMPARISON:  11/03/2012 FINDINGS: There is volume loss with tracheal deviation to the left and near complete whiteout of the left hemithorax. Compensatory hyperinflation of the right lung. Only a few aerated portions of left upper lobe are noted near the apex. The entire heart is obscured as is the aorta. No suspicious osseous abnormality. IMPRESSION: Near complete whiteout of the left lung with volume loss and mediastinal shift to the left. Compensatory hyperinflation of the normal-appearing right lung. Electronically Signed   By: Ashley Royalty M.D.   On: 04/15/2016 16:19   Ct Head W & Wo Contrast  Result Date: 04/21/2016 CLINICAL DATA:  46 year old male recently diagnosed with left lung mass, suspected stage IV non-small cell lung cancer. Unable to tolerate MRI. Staging. Subsequent encounter. EXAM: CT HEAD WITHOUT AND WITH CONTRAST TECHNIQUE: Contiguous axial images were obtained from the base of the skull through the vertex without and with intravenous contrast CONTRAST:  75 mL ISOVUE-300 IOPAMIDOL (ISOVUE-300) INJECTION 61% COMPARISON:  Limited brain MRI 04/20/2016. Noncontrast head CT 11/03/2012. FINDINGS: Brain: No midline shift, ventriculomegaly, mass effect, evidence of mass lesion, intracranial hemorrhage or evidence of cortically based acute infarction. Gray-white matter differentiation is within normal limits throughout the brain. No abnormal enhancement identified. Vascular: Calcified atherosclerosis at the skull base. Major intracranial vascular structures are enhancing. Skull: No  acute or suspicious osseous lesion. Sinuses/Orbits: Previous left mastoidectomy appears stable. Trace right mastoid effusion is stable. Visible paranasal sinuses remain well pneumatized. Other: Visualized orbits and scalp soft tissues are within normal limits. IMPRESSION: Normal CT appearance of the brain. No metastatic  disease identified. Electronically Signed   By: Genevie Ann M.D.   On: 04/21/2016 09:58   Ct Angio Chest Pe W And/or Wo Contrast  Result Date: 04/15/2016 CLINICAL DATA:  Cough for 5 months with dyspnea and weight loss. Left-sided chest pain. Current smoker. EXAM: CT ANGIOGRAPHY CHEST WITH CONTRAST TECHNIQUE: Multidetector CT imaging of the chest was performed using the standard protocol during bolus administration of intravenous contrast. Multiplanar CT image reconstructions and MIPs were obtained to evaluate the vascular anatomy. CONTRAST:  100 cc of Isovue 370 COMPARISON:  Same day CXR FINDINGS: Cardiovascular: The heart shifted toward the left hemithorax secondary to left lung collapse and volume loss. No acute pulmonary embolus. No aortic aneurysm or dissection. No pericardial effusion. Mediastinum/Nodes: There is prevascular lymphadenopathy measuring up to 12 mm short axis. No paratracheal, supraclavicular nor axillary lymphadenopathy. Lungs/Pleura: There is volume loss secondary to atelectasis and left lung collapse of the left upper lobe, lingula and left lower lobe. Tubular branching air like opacities are seen within the left lower consistent with air within bronchi and bronchioles. No confluent areas of cavitation noted to suggest pulmonary necrosis on current exam. Soft tissue densities are noted abruptly terminating the distal left main stem bronchus. An endoluminal mass or mucous causing the left lung collapse is not excluded. Faint ground-glass 7 mm in average and 5 mm in average nodular densities in the right upper lobe are noted, series 4 image 48 and 49. No effusion or pneumothorax on the right. Upper Abdomen: No acute abnormality within the abdomen. No enhancing hepatic mass. No definite adrenal nodule to the extent that they are included. Musculoskeletal: Nonacute Review of the MIP images confirms the above findings. IMPRESSION: 1. Left lung atelectasis/collapse with volume loss and shift of the  mediastinum to the left. Soft tissue densities in the distal mainstem bronchus may be secondary to is inspissation. Endobronchial mass or lesion is not entirely excluded. No definite evidence of pulmonary necrosis. Mild prevascular lymphadenopathy. 2. No acute pulmonary embolus. 3. 7 mm and 5 mm right upper lobe ground-glass and nodular opacities. Initial follow-up with CT at 6-12 months is recommended to confirm persistence. If persistent, repeat CT is recommended every 2 years until 5 years of stability has been established. This recommendation follows the consensus statement: Guidelines for Management of Incidental Pulmonary Nodules Detected on CT Images: From the Fleischner Society 2017; Radiology 2017; 284:228-243. Electronically Signed   By: Ashley Royalty M.D.   On: 04/15/2016 18:17   Mr Brain Wo Contrast  Result Date: 04/20/2016 CLINICAL DATA:  Lung mass.  Rule out metastatic disease EXAM: MRI HEAD WITHOUT CONTRAST TECHNIQUE: Multiplanar, multiecho pulse sequences of the brain and surrounding structures were obtained without intravenous contrast. COMPARISON:  CT head 11/03/2012 FINDINGS: Incomplete study. Sagittal T1 and axial diffusion only were obtained. The patient refused any further imaging. Ventricle size normal. Negative for acute infarct. No large mass lesion identified. Pituitary not enlarged. Calvarium is intact right IMPRESSION: Incomplete study. There is not adequate information to rule out metastatic disease. No large mass identified. If the patient refuses further MRI scanning with sedation, CT head without with contrast is suggested for staging. Electronically Signed   By: Franchot Gallo M.D.   On: 04/20/2016 06:59  Ct Abdomen Pelvis W Contrast  Result Date: 04/19/2016 CLINICAL DATA:  46 year old male presents with cough weight loss and poor appetite. EXAM: CT ABDOMEN AND PELVIS WITH CONTRAST TECHNIQUE: Multidetector CT imaging of the abdomen and pelvis was performed using the standard  protocol following bolus administration of intravenous contrast. CONTRAST:  11m ISOVUE-300 IOPAMIDOL (ISOVUE-300) INJECTION 61% COMPARISON:  Chest CT dated 04/15/2016 FINDINGS: Lower chest: There is complete consolidative changes of the left lung base as seen on the prior CT with associated volume loss and compensatory expansion of the right lung. There is shift of the mediastinum to the to the left hemithorax as seen on the prior CT. These findings are better evaluated on the chest CT of 04/15/2016. Correlation with clinical exam and follow-up to resolution recommended. The visualized right lung base is clear. There is no intra-abdominal free air.  No free fluid noted. Hepatobiliary: No focal liver abnormality is seen. No gallstones, gallbladder wall thickening, or biliary dilatation. Pancreas: Unremarkable. No pancreatic ductal dilatation or surrounding inflammatory changes. Spleen: Normal in size without focal abnormality. Adrenals/Urinary Tract: Adrenal glands are unremarkable. Kidneys are normal, without renal calculi, focal lesion, or hydronephrosis. Bladder is unremarkable. Stomach/Bowel: There is moderate amount of stool throughout the colon. There is no evidence of bowel obstruction or active inflammation. Normal appendix. Vascular/Lymphatic: No significant vascular findings are present. No enlarged abdominal or pelvic lymph nodes. Reproductive: The prostate and seminal vesicles are grossly unremarkable. Other: There is mild diffuse subcutaneous edema. No fluid collection. Musculoskeletal: There is diffuse disc bulge with disc desiccation and vacuum phenomena at L5-S1. Mild chronic bilateral sacroiliitis. The osseous structures are otherwise intact. IMPRESSION: No acute intra-abdominopelvic pathology. No findings suspicious of malignancy within the abdomen or pelvis. Complete consolidative changes of the visualized left lung base with volume loss and shift of the mediastinum into the left hemithorax as  seen on the prior CT. Correlation with clinical exam and follow-up to resolution is recommended. Electronically Signed   By: AAnner CreteM.D.   On: 04/19/2016 21:53   Nm Pet Image Initial (pi) Skull Base To Thigh  Result Date: 05/11/2016 CLINICAL DATA:  Initial treatment strategy for left lung adenocarcinoma. EXAM: NUCLEAR MEDICINE PET SKULL BASE TO THIGH TECHNIQUE: 7.48 mCi F-18 FDG was injected intravenously. Full-ring PET imaging was performed from the skull base to thigh after the radiotracer. CT data was obtained and used for attenuation correction and anatomic localization. FASTING BLOOD GLUCOSE:  Value: 117 mg/dl COMPARISON:  CT chest 04/15/2016.  Abdominopelvic CT 04/19/2016. FINDINGS: NECK No hypermetabolic cervical lymph nodes are identified.There are no lesions of the pharyngeal mucosal space. CHEST There is large hypermetabolic left hilar mass. This is not optimally measured due to adjacent postobstructive pneumonitis, although is estimated to measure approximately 3.9 x 3.5 cm on image 78. This has an SUV max of 14.5. There is resulting complete left upper lobe collapse and partial left lower lobe collapse. There are air bronchograms throughout the left lower lobe with surrounding parenchymal opacity demonstrating hypermetabolic activity, most likely postobstructive pneumonitis. Of note, the left hilar mass is in close proximity to the left ventricle, although demonstrates no definite cardiac involvement. There is no pericardial effusion. There is a dependent left pleural effusion without hypermetabolic activity. There are small hypermetabolic subcarinal (SUV max 4.7) and left retro hilar (SUV max 6.2) lymph nodes. These are not pathologically enlarged. No other definite hypermetabolic nodal activity seen within the chest. There is some low-level activity near the gastroesophageal junction which appears to be  related to the esophagus rather than adjacent mildly prominent lymph nodes. No  suspicious pulmonary activity within the right lung. The previously noted ground-glass densities in the right upper lobe are less evident. This difference may be technical. ABDOMEN/PELVIS There is no hypermetabolic activity within the liver, adrenal glands, spleen or pancreas. There is no hypermetabolic nodal activity. Mild aortic and branch vessel atherosclerosis. SKELETON There is no hypermetabolic activity to suggest osseous metastatic disease. IMPRESSION: 1. Large hypermetabolic left hilar mass with resulting left upper lobe collapse and postobstructive pneumonitis in the left lower lobe. Associated hypermetabolic left retro hilar and subcarinal lymph nodes. By this examination, this is most consistent with stage IIIA (T2a N2 M0) disease. 2. No suspicious abdominopelvic activity. 3. The previously demonstrated ground-glass densities in the right upper lobe are less apparent on this study. Attention on follow-up recommended. Electronically Signed   By: Richardean Sale M.D.   On: 05/11/2016 09:35   US Abdomen Limited Ruq  Result Date: 04/18/2016 CLINICAL DATA:  Elevated LFTs. EXAM: US ABDOMEN LIMITED - RIGHT UPPER QUADRANT COMPARISON:  None. FINDINGS: Gallbladder: No gallstones or wall thickening visualized. No sonographic Murphy sign noted by sonographer. Two small nonshadowing and non mobile echogenic foci along the gallbladder wall near the gallbladder neck with the largest measuring 4 mm likely polyps. Common bile duct: Diameter: 1.7 mm. Liver: No focal lesion identified. Within normal limits in parenchymal echogenicity. IMPRESSION: No acute hepatobiliary disease. Two probable small polyps near the gallbladder base with the larger measuring 4 mm. Electronically Signed   By: Marin Olp M.D.   On: 04/18/2016 14:40    ASSESSMENT AND PLAN: This is a very pleasant 46 years old white male recently diagnosed with a stage IIIa non-small cell lung cancer, squamous cell carcinoma presented with central  obstructing left lung mass as well as mediastinal lymphadenopathy diagnosed in December 2017. The patient had a recent PET scan. I personally and independently reviewed the scan images and discuss the results with the patient and his girlfriend and showed them the images. His disease still limited to the left side of the chest was no evidence of metastatic disease. His CT of the head was also unremarkable for metastatic disease to the brain. The patient is currently undergoing palliative radiotherapy to the central obstructing lung mass. I had a lengthy discussion with the patient and his girlfriend today about his treatment options. I recommended for the patient a course of concurrent chemoradiation with weekly carboplatin for AUC of 2 and paclitaxel 45 MG/M2. I discussed with the patient adverse effect of the chemotherapy including but not limited to alopecia, myelosuppression, nausea and vomiting, peripheral neuropathy, liver or renal dysfunction. I will arrange for the patient to have a chemotherapy education class before starting the first dose of his chemotherapy. He is expected to start the first dose of this treatment next week. After completion of the course of concurrent chemoradiation, I would consider the patient for consolidation chemotherapy with 3 more cycles of carboplatin and paclitaxel every 3 weeks. I will call his pharmacy with prescription for Compazine 10 mg by mouth every 6 hours as needed for nausea. I will arrange for the patient to come back for follow-up visit in 2 weeks for evaluation for evaluation and management of any adverse effect of his treatment. He was advised to call immediately if he has any concerning symptoms in the interval. The patient voices understanding of current disease status and treatment options and is in agreement with the current care plan.  All questions were answered. The patient knows to call the clinic with any problems, questions or concerns. We  can certainly see the patient much sooner if necessary.  I spent 15 minutes counseling the patient face to face. The total time spent in the appointment was 25 minutes.  Disclaimer: This note was dictated with voice recognition software. Similar sounding words can inadvertently be transcribed and may not be corrected upon review.

## 2016-05-14 NOTE — Progress Notes (Signed)
Patient presented to the clinic requesting a refill of his Roxicodone. Patient reports taking two tablets every three hours instead of every four hours as prescribed. Informed Dr. Tammi Klippel of these findings. Refilled script as ordered by Dr. Tammi Klippel.

## 2016-05-14 NOTE — Telephone Encounter (Signed)
FYI "This is Anselm Pancoast calling for my boyfriend.  He missed a call two hours ago.  What was the call for?  He'd asked for pain medication when he was there earlier.  He's at the [facility] office trying to get his birth certificate for disability.  he's not able to work and bills falling behind so my phone is off.  Return call to his number [phone] if needed."

## 2016-05-14 NOTE — Progress Notes (Signed)
Oncology Nurse Navigator Documentation  Oncology Nurse Navigator Flowsheets 05/14/2016  Navigator Location [facility]  Navigator Encounter Type Clinic/MDC/I spoke with patient and wife today at [facility]  Gave and explained information on lung cancer, chemotherapy and treatment regimen.  I asked that they call with any questions or concerns.   Abnormal Finding Date 04/15/2016  Patient Visit Type MedOnc  Treatment Phase Treatment  Barriers/Navigation Needs Education  Education Newly Diagnosed Cancer Education;Other  Interventions Education  Education Method Verbal;Written  Acuity Level 2  Acuity Level 2 Educational needs  Time Spent with Patient 30

## 2016-05-14 NOTE — Telephone Encounter (Signed)
Phoned patient at home. Explained roxicodone script is ready for pick up in the radiation oncology nursing area. Explained he would need to bring his drivers license with him for identification purposes in order to pick up the prescription. Patient verbalized understanding.

## 2016-05-14 NOTE — Telephone Encounter (Signed)
Oncology Nurse Navigator Documentation  Oncology Nurse Navigator Flowsheets 05/14/2016  Navigator Location [facility]  Navigator Encounter Type Telephone/Douglas Kelly family called.  They thought I had called.  Updated  Telephone Outgoing Call  Barriers/Navigation Needs No barriers at this time  Acuity Level 1  Acuity Level 1 Minimal follow up required  Time Spent with Patient 15

## 2016-05-14 NOTE — Telephone Encounter (Signed)
Pt uses [facility]

## 2016-05-14 NOTE — Progress Notes (Signed)
START ON PATHWAY REGIMEN - Non-Small Cell Lung  [id number]: Carboplatin AUC=2 + Paclitaxel 45 mg/m2 Weekly During Radiation   Administer weekly:     Paclitaxel (Taxol(R)) 45 mg/m2 in 250 mL NS IV over 1 hour followed by Dose Mod: None     Carboplatin (Paraplatin(R)) AUC=2 in 100 mL NS IV over 30 minutes Dose Mod: None Additional Orders: * All AUC calculations intended to be used in [employer] formula  **Always confirm dose/schedule in your pharmacy ordering system**    Patient Characteristics: Stage III - Unresectable, PS = 0, 1 Check here if patient was staged using an edition prior to AJCC Staging - 8th Edition (i.e., prior to May 10, 2016)? false AJCC T Category: T2a Current Disease Status: No Distant Mets or Local Recurrence AJCC N Category: N2 AJCC M Category: M0 AJCC 8 Stage Grouping: IIIA Performance Status: PS = 0, 1  Intent of Therapy: Curative Intent, Discussed with Patient

## 2016-05-14 NOTE — Telephone Encounter (Addendum)
Per 1/5 LOS I have scheduled appts and notified the scheduler

## 2016-05-17 ENCOUNTER — Ambulatory Visit
Admission: RE | Admit: 2016-05-17 | Discharge: 2016-05-17 | Disposition: A | Discharge status: Home / Self Care | Payer: Medicaid Other | Source: Ambulatory Visit | Attending: Radiation Oncology | Admitting: Radiation Oncology

## 2016-05-17 ENCOUNTER — Other Ambulatory Visit: Payer: Self-pay

## 2016-05-17 DIAGNOSIS — Z510 Encounter for antineoplastic radiation therapy: Secondary | ICD-10-CM | POA: Diagnosis not present

## 2016-05-17 MED FILL — oxyCODONE HCL 5 MG TABS: 5 | 7 days supply | Qty: 90 | Fill #0

## 2016-05-18 ENCOUNTER — Ambulatory Visit (HOSPITAL_BASED_OUTPATIENT_CLINIC_OR_DEPARTMENT_OTHER): Payer: Medicaid Other

## 2016-05-18 ENCOUNTER — Other Ambulatory Visit (HOSPITAL_BASED_OUTPATIENT_CLINIC_OR_DEPARTMENT_OTHER): Payer: Medicaid Other

## 2016-05-18 ENCOUNTER — Other Ambulatory Visit: Payer: Self-pay | Admitting: Internal Medicine

## 2016-05-18 ENCOUNTER — Encounter: Payer: Self-pay | Admitting: Internal Medicine

## 2016-05-18 ENCOUNTER — Ambulatory Visit
Admission: RE | Admit: 2016-05-18 | Discharge: 2016-05-18 | Disposition: A | Discharge status: Home / Self Care | Payer: Medicaid Other | Source: Ambulatory Visit | Attending: Radiation Oncology | Admitting: Radiation Oncology

## 2016-05-18 VITALS — BP 120/73 | HR 83 | Temp 98.2°F | Resp 20

## 2016-05-18 DIAGNOSIS — Z510 Encounter for antineoplastic radiation therapy: Secondary | ICD-10-CM | POA: Diagnosis not present

## 2016-05-18 DIAGNOSIS — Z5111 Encounter for antineoplastic chemotherapy: Secondary | ICD-10-CM

## 2016-05-18 DIAGNOSIS — C3492 Malignant neoplasm of unspecified part of left bronchus or lung: Secondary | ICD-10-CM

## 2016-05-18 LAB — CBC WITH DIFFERENTIAL/PLATELET
BASO%: 0.5 % (ref 0.0–2.0)
Basophils Absolute: 0 10*3/uL (ref 0.0–0.1)
EOS%: 3.3 % (ref 0.0–7.0)
Eosinophils Absolute: 0.3 10*3/uL (ref 0.0–0.5)
HEMATOCRIT: 36.8 % — AB (ref 38.4–49.9)
HGB: 12.4 g/dL — ABNORMAL LOW (ref 13.0–17.1)
LYMPH#: 0.4 10*3/uL — AB (ref 0.9–3.3)
LYMPH%: 5 % — ABNORMAL LOW (ref 14.0–49.0)
MCH: 30.6 pg (ref 27.2–33.4)
MCHC: 33.7 g/dL (ref 32.0–36.0)
MCV: 90.9 fL (ref 79.3–98.0)
MONO#: 0.8 10*3/uL (ref 0.1–0.9)
MONO%: 10.7 % (ref 0.0–14.0)
NEUT%: 80.5 % — ABNORMAL HIGH (ref 39.0–75.0)
NEUTROS ABS: 6.1 10*3/uL (ref 1.5–6.5)
Platelets: 317 10*3/uL (ref 140–400)
RBC: 4.05 10*6/uL — ABNORMAL LOW (ref 4.20–5.82)
RDW: 15 % — ABNORMAL HIGH (ref 11.0–14.6)
WBC: 7.6 10*3/uL (ref 4.0–10.3)

## 2016-05-18 LAB — COMPREHENSIVE METABOLIC PANEL
ALBUMIN: 3.3 g/dL — AB (ref 3.5–5.0)
ALT: 20 U/L (ref 0–55)
AST: 15 U/L (ref 5–34)
Alkaline Phosphatase: 103 U/L (ref 40–150)
Anion Gap: 9 mEq/L (ref 3–11)
BUN: 10.8 mg/dL (ref 7.0–26.0)
CO2: 26 mEq/L (ref 22–29)
CREATININE: 0.8 mg/dL (ref 0.7–1.3)
Calcium: 9.9 mg/dL (ref 8.4–10.4)
Chloride: 103 mEq/L (ref 98–109)
EGFR: >90 mL/min/{1.73_m2} (ref >90)
GLUCOSE: 95 mg/dL (ref 70–140)
POTASSIUM: 4.2 meq/L (ref 3.5–5.1)
SODIUM: 138 meq/L (ref 136–145)
TOTAL PROTEIN: 7.9 g/dL (ref 6.4–8.3)
Total Bilirubin: 0.41 mg/dL (ref 0.20–1.20)

## 2016-05-18 MED ORDER — PALONOSETRON HCL INJECTION 0.25 MG/5ML
0.2500 mg | Freq: Once | INTRAVENOUS | Status: AC
  Administered 2016-05-18: 0.25 mg via INTRAVENOUS

## 2016-05-18 MED ORDER — SODIUM CHLORIDE 0.9 % IV SOLN
Freq: Once | INTRAVENOUS | Status: AC
  Administered 2016-05-18: 12:00:00 via INTRAVENOUS

## 2016-05-18 MED ORDER — FAMOTIDINE IN NACL 20-0.9 MG/50ML-% IV SOLN
20.0000 mg | Freq: Once | INTRAVENOUS | Status: AC
Start: 2016-05-18 — End: 2016-05-18
  Administered 2016-05-18: 20 mg via INTRAVENOUS

## 2016-05-18 MED ORDER — DIPHENHYDRAMINE HCL 50 MG/ML IJ SOLN
INTRAMUSCULAR | Status: AC
  Filled 2016-05-18: qty 1

## 2016-05-18 MED ORDER — PALONOSETRON HCL INJECTION 0.25 MG/5ML
INTRAVENOUS | Status: AC
  Filled 2016-05-18: qty 5

## 2016-05-18 MED ORDER — SODIUM CHLORIDE 0.9 % IV SOLN
20.0000 mg | Freq: Once | INTRAVENOUS | Status: AC
  Administered 2016-05-18: 20 mg via INTRAVENOUS
  Filled 2016-05-18: qty 2

## 2016-05-18 MED ORDER — PACLITAXEL CHEMO INJECTION 300 MG/50ML
45.0000 mg/m2 | Freq: Once | INTRAVENOUS | Status: AC
  Administered 2016-05-18: 84 mg via INTRAVENOUS
  Filled 2016-05-18: qty 14

## 2016-05-18 MED ORDER — SODIUM CHLORIDE 0.9 % IV SOLN
271.8000 mg | Freq: Once | INTRAVENOUS | Status: AC
  Administered 2016-05-18: 270 mg via INTRAVENOUS
  Filled 2016-05-18: qty 27

## 2016-05-18 MED ORDER — DIPHENHYDRAMINE HCL 50 MG/ML IJ SOLN
50.0000 mg | Freq: Once | INTRAMUSCULAR | Status: AC
  Administered 2016-05-18: 50 mg via INTRAVENOUS

## 2016-05-18 MED ORDER — FAMOTIDINE IN NACL 20-0.9 MG/50ML-% IV SOLN
INTRAVENOUS | Status: AC
  Filled 2016-05-18: qty 50

## 2016-05-18 NOTE — Progress Notes (Signed)
Counseling intern met with patient and girlfriend during patient treatment. Counselor had met patient in December while he was admitted at [facility] and had learned of the cancer diagnosis. Today, patient appeared alert and oriented and engaged clearly with counselor throughout visit. Patient noted that daily treatment has been difficult, but he continues to fight in order to be cancer-free. He described his attempt to return to work after leaving [employer] but having to give up and accepting that the illness limits what he is able to do. Patient's girlfriend described how she cares for patient by encouraging him to continue with treatment on days when he wants to give up, keeping up with medications and paperwork, and taking care of their home. Patient described having a loving and supportive family, especially his mother. Counselor helped girlfriend locate the [employer] office and provided couple with information about the [employer].  Counselor will continue to check in with patient as schedule permits.   Lamount Cohen, Counseling Intern Department for Spiritual Care and [facility] Supervisor - Scientist, research (physical sciences)

## 2016-05-18 NOTE — Progress Notes (Signed)
Met w/ pt to discuss drug replacement for Aloxi.  I completed the application, dr and pt signed it so I faxed it along w/ a notarized letter of support to [employer] for processing.

## 2016-05-18 NOTE — Patient Instructions (Addendum)
[place] Discharge Instructions for Patients Receiving Chemotherapy  Today you received the following chemotherapy agents Taxol and Carboplatin   To help prevent nausea and vomiting after your treatment, we encourage you to take your nausea medication as directed. No Zofran for 3 days.Take Compazine instead.    If you develop nausea and vomiting that is not controlled by your nausea medication, call the clinic.   BELOW ARE SYMPTOMS THAT SHOULD BE REPORTED IMMEDIATELY:  *FEVER GREATER THAN 100.5 F  *CHILLS WITH OR WITHOUT FEVER  NAUSEA AND VOMITING THAT IS NOT CONTROLLED WITH YOUR NAUSEA MEDICATION  *UNUSUAL SHORTNESS OF BREATH  *UNUSUAL BRUISING OR BLEEDING  TENDERNESS IN MOUTH AND THROAT WITH OR WITHOUT PRESENCE OF ULCERS  *URINARY PROBLEMS  *BOWEL PROBLEMS  UNUSUAL RASH Items with * indicate a potential emergency and should be followed up as soon as possible.  Feel free to call the clinic you have any questions or concerns. The clinic phone number is [phone].  Please show the [place] at check-in to the Emergency Department and triage nurse.  Paclitaxel injection What is this medicine? PACLITAXEL (PAK li TAX el) is a chemotherapy drug. It targets fast dividing cells, like cancer cells, and causes these cells to die. This medicine is used to treat ovarian cancer, breast cancer, and other cancers. This medicine may be used for other purposes; ask your health care provider or pharmacist if you have questions. COMMON BRAND NAME(S): Onxol, Taxol What should I tell my health care provider before I take this medicine? They need to know if you have any of these conditions: -blood disorders -irregular heartbeat -infection (especially a virus infection such as chickenpox, cold sores, or herpes) -liver disease -previous or ongoing radiation therapy -an unusual or allergic reaction to paclitaxel, alcohol, polyoxyethylated castor oil, other  chemotherapy agents, other medicines, foods, dyes, or preservatives -pregnant or trying to get pregnant -breast-feeding How should I use this medicine? This drug is given as an infusion into a vein. It is administered in a hospital or clinic by a specially trained health care professional. Talk to your pediatrician regarding the use of this medicine in children. Special care may be needed. Overdosage: If you think you have taken too much of this medicine contact a poison control center or emergency room at once. NOTE: This medicine is only for you. Do not share this medicine with others. What if I miss a dose? It is important not to miss your dose. Call your doctor or health care professional if you are unable to keep an appointment. What may interact with this medicine? Do not take this medicine with any of the following medications: -disulfiram -metronidazole This medicine may also interact with the following medications: -cyclosporine -diazepam -ketoconazole -medicines to increase blood counts like filgrastim, pegfilgrastim, sargramostim -other chemotherapy drugs like cisplatin, doxorubicin, epirubicin, etoposide, teniposide, vincristine -quinidine -testosterone -vaccines -verapamil Talk to your doctor or health care professional before taking any of these medicines: -acetaminophen -aspirin -ibuprofen -ketoprofen -naproxen This list may not describe all possible interactions. Give your health care provider a list of all the medicines, herbs, non-prescription drugs, or dietary supplements you use. Also tell them if you smoke, drink alcohol, or use illegal drugs. Some items may interact with your medicine. What should I watch for while using this medicine? Your condition will be monitored carefully while you are receiving this medicine. You will need important blood work done while you are taking this medicine. This medicine can cause serious allergic  reactions. To reduce your risk  you will need to take other medicine(s) before treatment with this medicine. If you experience allergic reactions like skin rash, itching or hives, swelling of the face, lips, or tongue, tell your doctor or health care professional right away. In some cases, you may be given additional medicines to help with side effects. Follow all directions for their use. This drug may make you feel generally unwell. This is not uncommon, as chemotherapy can affect healthy cells as well as cancer cells. Report any side effects. Continue your course of treatment even though you feel ill unless your doctor tells you to stop. Call your doctor or health care professional for advice if you get a fever, chills or sore throat, or other symptoms of a cold or flu. Do not treat yourself. This drug decreases your body's ability to fight infections. Try to avoid being around people who are sick. This medicine may increase your risk to bruise or bleed. Call your doctor or health care professional if you notice any unusual bleeding. Be careful brushing and flossing your teeth or using a toothpick because you may get an infection or bleed more easily. If you have any dental work done, tell your dentist you are receiving this medicine. Avoid taking products that contain aspirin, acetaminophen, ibuprofen, naproxen, or ketoprofen unless instructed by your doctor. These medicines may hide a fever. Do not become pregnant while taking this medicine. Women should inform their doctor if they wish to become pregnant or think they might be pregnant. There is a potential for serious side effects to an unborn child. Talk to your health care professional or pharmacist for more information. Do not breast-feed an infant while taking this medicine. Men are advised not to father a child while receiving this medicine. This product may contain alcohol. Ask your pharmacist or healthcare provider if this medicine contains alcohol. Be sure to tell all  healthcare providers you are taking this medicine. Certain medicines, like metronidazole and disulfiram, can cause an unpleasant reaction when taken with alcohol. The reaction includes flushing, headache, nausea, vomiting, sweating, and increased thirst. The reaction can last from 30 minutes to several hours. What side effects may I notice from receiving this medicine? Side effects that you should report to your doctor or health care professional as soon as possible: -allergic reactions like skin rash, itching or hives, swelling of the face, lips, or tongue -low blood counts - This drug may decrease the number of white blood cells, red blood cells and platelets. You may be at increased risk for infections and bleeding. -signs of infection - fever or chills, cough, sore throat, pain or difficulty passing urine -signs of decreased platelets or bleeding - bruising, pinpoint red spots on the skin, black, tarry stools, nosebleeds -signs of decreased red blood cells - unusually weak or tired, fainting spells, lightheadedness -breathing problems -chest pain -high or low blood pressure -mouth sores -nausea and vomiting -pain, swelling, redness or irritation at the injection site -pain, tingling, numbness in the hands or feet -slow or irregular heartbeat -swelling of the ankle, feet, hands Side effects that usually do not require medical attention (report to your doctor or health care professional if they continue or are bothersome): -bone pain -complete hair loss including hair on your head, underarms, pubic hair, eyebrows, and eyelashes -changes in the color of fingernails -diarrhea -loosening of the fingernails -loss of appetite -muscle or joint pain -red flush to skin -sweating This list may not describe all possible  side effects. Call your doctor for medical advice about side effects. You may report side effects to FDA at 1-800-FDA-1088. Where should I keep my medicine? This drug is given in  a hospital or clinic and will not be stored at home. NOTE: This sheet is a summary. It may not cover all possible information. If you have questions about this medicine, talk to your doctor, pharmacist, or health care provider.  2017 Elsevier/Gold Standard (2015-02-25 19:58:00)  Carboplatin injection What is this medicine? CARBOPLATIN (KAR boe pla tin) is a chemotherapy drug. It targets fast dividing cells, like cancer cells, and causes these cells to die. This medicine is used to treat ovarian cancer and many other cancers. This medicine may be used for other purposes; ask your health care provider or pharmacist if you have questions. COMMON BRAND NAME(S): Paraplatin What should I tell my health care provider before I take this medicine? They need to know if you have any of these conditions: -blood disorders -hearing problems -kidney disease -recent or ongoing radiation therapy -an unusual or allergic reaction to carboplatin, cisplatin, other chemotherapy, other medicines, foods, dyes, or preservatives -pregnant or trying to get pregnant -breast-feeding How should I use this medicine? This drug is usually given as an infusion into a vein. It is administered in a hospital or clinic by a specially trained health care professional. Talk to your pediatrician regarding the use of this medicine in children. Special care may be needed. Overdosage: If you think you have taken too much of this medicine contact a poison control center or emergency room at once. NOTE: This medicine is only for you. Do not share this medicine with others. What if I miss a dose? It is important not to miss a dose. Call your doctor or health care professional if you are unable to keep an appointment. What may interact with this medicine? -medicines for seizures -medicines to increase blood counts like filgrastim, pegfilgrastim, sargramostim -some antibiotics like amikacin, gentamicin, neomycin, streptomycin,  tobramycin -vaccines Talk to your doctor or health care professional before taking any of these medicines: -acetaminophen -aspirin -ibuprofen -ketoprofen -naproxen This list may not describe all possible interactions. Give your health care provider a list of all the medicines, herbs, non-prescription drugs, or dietary supplements you use. Also tell them if you smoke, drink alcohol, or use illegal drugs. Some items may interact with your medicine. What should I watch for while using this medicine? Your condition will be monitored carefully while you are receiving this medicine. You will need important blood work done while you are taking this medicine. This drug may make you feel generally unwell. This is not uncommon, as chemotherapy can affect healthy cells as well as cancer cells. Report any side effects. Continue your course of treatment even though you feel ill unless your doctor tells you to stop. In some cases, you may be given additional medicines to help with side effects. Follow all directions for their use. Call your doctor or health care professional for advice if you get a fever, chills or sore throat, or other symptoms of a cold or flu. Do not treat yourself. This drug decreases your body's ability to fight infections. Try to avoid being around people who are sick. This medicine may increase your risk to bruise or bleed. Call your doctor or health care professional if you notice any unusual bleeding. Be careful brushing and flossing your teeth or using a toothpick because you may get an infection or bleed more easily. If you  have any dental work done, tell your dentist you are receiving this medicine. Avoid taking products that contain aspirin, acetaminophen, ibuprofen, naproxen, or ketoprofen unless instructed by your doctor. These medicines may hide a fever. Do not become pregnant while taking this medicine. Women should inform their doctor if they wish to become pregnant or think  they might be pregnant. There is a potential for serious side effects to an unborn child. Talk to your health care professional or pharmacist for more information. Do not breast-feed an infant while taking this medicine. What side effects may I notice from receiving this medicine? Side effects that you should report to your doctor or health care professional as soon as possible: -allergic reactions like skin rash, itching or hives, swelling of the face, lips, or tongue -signs of infection - fever or chills, cough, sore throat, pain or difficulty passing urine -signs of decreased platelets or bleeding - bruising, pinpoint red spots on the skin, black, tarry stools, nosebleeds -signs of decreased red blood cells - unusually weak or tired, fainting spells, lightheadedness -breathing problems -changes in hearing -changes in vision -chest pain -high blood pressure -low blood counts - This drug may decrease the number of white blood cells, red blood cells and platelets. You may be at increased risk for infections and bleeding. -nausea and vomiting -pain, swelling, redness or irritation at the injection site -pain, tingling, numbness in the hands or feet -problems with balance, talking, walking -trouble passing urine or change in the amount of urine Side effects that usually do not require medical attention (report to your doctor or health care professional if they continue or are bothersome): -hair loss -loss of appetite -metallic taste in the mouth or changes in taste This list may not describe all possible side effects. Call your doctor for medical advice about side effects. You may report side effects to FDA at 1-800-FDA-1088. Where should I keep my medicine? This drug is given in a hospital or clinic and will not be stored at home. NOTE: This sheet is a summary. It may not cover all possible information. If you have questions about this medicine, talk to your doctor, pharmacist, or health care  provider.  2017 Elsevier/Gold Standard (2007-08-01 14:38:05)

## 2016-05-19 ENCOUNTER — Encounter: Payer: Self-pay | Admitting: Internal Medicine

## 2016-05-19 ENCOUNTER — Ambulatory Visit
Admission: RE | Admit: 2016-05-19 | Discharge: 2016-05-19 | Disposition: A | Discharge status: Home / Self Care | Payer: Medicaid Other | Source: Ambulatory Visit | Attending: Radiation Oncology | Admitting: Radiation Oncology

## 2016-05-19 DIAGNOSIS — Z510 Encounter for antineoplastic radiation therapy: Secondary | ICD-10-CM | POA: Diagnosis not present

## 2016-05-19 NOTE — Progress Notes (Signed)
Pt is approved with [facility] to receive Aloxi at no cost from 05/19/16 to 05/18/17.

## 2016-05-20 ENCOUNTER — Ambulatory Visit
Admission: RE | Admit: 2016-05-20 | Discharge: 2016-05-20 | Disposition: A | Discharge status: Home / Self Care | Payer: Medicaid Other | Source: Ambulatory Visit | Attending: Radiation Oncology | Admitting: Radiation Oncology

## 2016-05-20 DIAGNOSIS — Z510 Encounter for antineoplastic radiation therapy: Secondary | ICD-10-CM | POA: Diagnosis not present

## 2016-05-21 ENCOUNTER — Ambulatory Visit
Admission: RE | Admit: 2016-05-21 | Discharge: 2016-05-21 | Disposition: A | Discharge status: Home / Self Care | Payer: Medicaid Other | Source: Ambulatory Visit | Attending: Radiation Oncology | Admitting: Radiation Oncology

## 2016-05-21 ENCOUNTER — Ambulatory Visit
Admission: RE | Admit: 2016-05-21 | Discharge: 2016-05-21 | Disposition: A | Discharge status: Home / Self Care | Payer: Self-pay | Source: Ambulatory Visit | Attending: Radiation Oncology | Admitting: Radiation Oncology

## 2016-05-21 ENCOUNTER — Encounter: Payer: Self-pay | Admitting: Radiation Oncology

## 2016-05-21 VITALS — BP 106/80 | HR 110 | Temp 97.7°F | Resp 18 | Ht 68.0 in | Wt 146.8 lb

## 2016-05-21 DIAGNOSIS — C3492 Malignant neoplasm of unspecified part of left bronchus or lung: Secondary | ICD-10-CM

## 2016-05-21 DIAGNOSIS — Z510 Encounter for antineoplastic radiation therapy: Secondary | ICD-10-CM | POA: Diagnosis not present

## 2016-05-21 NOTE — Progress Notes (Signed)
  Radiation Oncology         [phone] ________________________________  Name: Douglas Kelly MRN: [id number]  Date: 05/21/2016  DOB: May 20, 1970  Chart Note:  Due to tumor regression, repeat CT sim is required and performed today. ________________________________  Sheral Apley. Tammi Klippel, M.D.

## 2016-05-21 NOTE — Progress Notes (Addendum)
Weight and vitals stable. Reports pain 7 /10 back and chest taking Percocet 5/325 mg.  Reports overall  breathing is better with less SOB. Reports a persistent dry cough improving. Reports new onset of mild discomfort associated with swallowing.  Drinking ensure one a day has not been drinking on a day over the past week. Wt Readings from Last 3 Encounters:  05/21/16 146 lb 12.8 oz (66.6 kg)  05/14/16 151 lb 6.4 oz (68.7 kg)  05/07/16 151 lb 9.6 oz (68.8 kg)  Last 5 1/2 pounds BP 106/80   Pulse (!) 110   Temp 97.7 F (36.5 C) (Oral)   Resp 18   Ht '5\' 8"'$  (1.727 m)   Wt 146 lb 12.8 oz (66.6 kg)   SpO2 96%   BMI 22.32 kg/m  Right arm sitting B/P 103/83 Pulse 110  Right arm standing

## 2016-05-21 NOTE — Progress Notes (Signed)
  Radiation Oncology         [phone]   Name: Douglas Kelly MRN: [id number]   Date: 05/21/2016  DOB: Sep 29, 1970     Weekly Radiation Therapy Management    ICD-9-CM ICD-10-CM   1. 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  Reports pain 7 /10 back and chest taking Percocet 5/325 mg.  Reports overall  breathing is better with less SOB. Reports a persistent dry cough improving. Reports new onset of mild discomfort associated with swallowing.  Drinking ensure one a day has not been drinking on a day over the past week  The patient is without complaint. Set-up films were reviewed. The chart was checked.  Physical Findings  height is '5\' 8"'$  (1.727 m) and weight is 146 lb 12.8 oz (66.6 kg). His oral temperature is 97.7 F (36.5 C). His blood pressure is 106/80 and his pulse is 110 (abnormal). His respiration is 18 and oxygen saturation is 96%. . Weight essentially stable.  No significant changes.  Impression The patient is tolerating radiation.  Plan Continue treatment as planned.      Sheral Apley Tammi Klippel, M.D.  This document serves as a record of services personally performed by [name], MD. It was created on his behalf by Arlyce Harman, a trained medical scribe. The creation of this record is based on the scribe's personal observations and the provider's statements to them. This document has been checked and approved by the attending provider.

## 2016-05-22 ENCOUNTER — Inpatient Hospital Stay (HOSPITAL_COMMUNITY)
Admission: EM | Admit: 2016-05-22 | Discharge: 2016-06-02 | DRG: 199 | Disposition: A | Discharge status: Home / Self Care | Payer: Medicaid Other | Attending: Surgery | Admitting: Surgery

## 2016-05-22 ENCOUNTER — Other Ambulatory Visit (HOSPITAL_COMMUNITY): Payer: Self-pay

## 2016-05-22 ENCOUNTER — Other Ambulatory Visit: Payer: Self-pay

## 2016-05-22 ENCOUNTER — Emergency Department (HOSPITAL_COMMUNITY): Payer: Medicaid Other

## 2016-05-22 ENCOUNTER — Encounter (HOSPITAL_COMMUNITY): Payer: Self-pay | Admitting: Emergency Medicine

## 2016-05-22 DIAGNOSIS — Z4682 Encounter for fitting and adjustment of non-vascular catheter: Secondary | ICD-10-CM

## 2016-05-22 DIAGNOSIS — J189 Pneumonia, unspecified organism: Secondary | ICD-10-CM | POA: Diagnosis present

## 2016-05-22 DIAGNOSIS — J9382 Other air leak: Secondary | ICD-10-CM | POA: Diagnosis not present

## 2016-05-22 DIAGNOSIS — J9819 Other pulmonary collapse: Secondary | ICD-10-CM | POA: Diagnosis present

## 2016-05-22 DIAGNOSIS — R079 Chest pain, unspecified: Secondary | ICD-10-CM | POA: Diagnosis present

## 2016-05-22 DIAGNOSIS — Z79891 Long term (current) use of opiate analgesic: Secondary | ICD-10-CM | POA: Diagnosis not present

## 2016-05-22 DIAGNOSIS — C3492 Malignant neoplasm of unspecified part of left bronchus or lung: Secondary | ICD-10-CM

## 2016-05-22 DIAGNOSIS — J9311 Primary spontaneous pneumothorax: Secondary | ICD-10-CM

## 2016-05-22 DIAGNOSIS — Z87891 Personal history of nicotine dependence: Secondary | ICD-10-CM

## 2016-05-22 DIAGNOSIS — J9383 Other pneumothorax: Principal | ICD-10-CM

## 2016-05-22 DIAGNOSIS — Z9221 Personal history of antineoplastic chemotherapy: Secondary | ICD-10-CM | POA: Diagnosis not present

## 2016-05-22 DIAGNOSIS — J9811 Atelectasis: Secondary | ICD-10-CM

## 2016-05-22 DIAGNOSIS — R0602 Shortness of breath: Secondary | ICD-10-CM

## 2016-05-22 DIAGNOSIS — Z9689 Presence of other specified functional implants: Secondary | ICD-10-CM

## 2016-05-22 DIAGNOSIS — C3490 Malignant neoplasm of unspecified part of unspecified bronchus or lung: Secondary | ICD-10-CM

## 2016-05-22 DIAGNOSIS — J939 Pneumothorax, unspecified: Secondary | ICD-10-CM | POA: Diagnosis present

## 2016-05-22 LAB — CBC WITH DIFFERENTIAL/PLATELET
BASOS ABS: 0.1 10*3/uL (ref 0.0–0.1)
BASOS PCT: 1 %
Eosinophils Absolute: 0.5 10*3/uL (ref 0.0–0.7)
Eosinophils Relative: 6 %
HEMATOCRIT: 37.2 % — AB (ref 39.0–52.0)
HEMOGLOBIN: 12.7 g/dL — AB (ref 13.0–17.0)
LYMPHS PCT: 9 %
Lymphs Abs: 0.7 10*3/uL (ref 0.7–4.0)
MCH: 30.5 pg (ref 26.0–34.0)
MCHC: 34.1 g/dL (ref 30.0–36.0)
MCV: 89.4 fL (ref 78.0–100.0)
Monocytes Absolute: 0.5 10*3/uL (ref 0.1–1.0)
Monocytes Relative: 7 %
NEUTROS ABS: 6 10*3/uL (ref 1.7–7.7)
NEUTROS PCT: 77 %
Platelets: 398 10*3/uL (ref 150–400)
RBC: 4.16 MIL/uL — AB (ref 4.22–5.81)
RDW: 14.7 % (ref 11.5–15.5)
WBC: 7.8 10*3/uL (ref 4.0–10.5)

## 2016-05-22 LAB — BASIC METABOLIC PANEL
ANION GAP: 10 (ref 5–15)
BUN: 15 mg/dL (ref 6–20)
CALCIUM: 9.5 mg/dL (ref 8.9–10.3)
CHLORIDE: 103 mmol/L (ref 101–111)
CO2: 23 mmol/L (ref 22–32)
Creatinine, Ser: 0.77 mg/dL (ref 0.61–1.24)
GFR calc non Af Amer: >60 mL/min (ref >60)
GLUCOSE: 107 mg/dL — AB (ref 65–99)
POTASSIUM: 4.7 mmol/L (ref 3.5–5.1)
Sodium: 136 mmol/L (ref 135–145)

## 2016-05-22 MED ORDER — PROCHLORPERAZINE MALEATE 5 MG PO TABS
10.0000 mg | ORAL_TABLET | Freq: Four times a day (QID) | ORAL | Status: DC | PRN
  Filled 2016-05-22: qty 2

## 2016-05-22 MED ORDER — IPRATROPIUM-ALBUTEROL 0.5-2.5 (3) MG/3ML IN SOLN
3.0000 mL | RESPIRATORY_TRACT | Status: DC | PRN
  Administered 2016-05-26 – 2016-06-01 (×4): 3 mL via RESPIRATORY_TRACT
  Filled 2016-05-22 (×4): qty 3

## 2016-05-22 MED ORDER — ACETAMINOPHEN 325 MG PO TABS
650.0000 mg | ORAL_TABLET | Freq: Four times a day (QID) | ORAL | Status: DC | PRN
  Administered 2016-05-29 – 2016-05-30 (×2): 650 mg via ORAL
  Filled 2016-05-22 (×2): qty 2

## 2016-05-22 MED ORDER — SENNOSIDES-DOCUSATE SODIUM 8.6-50 MG PO TABS
1.0000 | ORAL_TABLET | Freq: Every evening | ORAL | Status: DC | PRN
  Administered 2016-05-30: 1 via ORAL
  Filled 2016-05-22: qty 1

## 2016-05-22 MED ORDER — IPRATROPIUM-ALBUTEROL 18-103 MCG/ACT IN AERO
1.0000 | INHALATION_SPRAY | RESPIRATORY_TRACT | Status: DC | PRN
Start: 2016-05-22 — End: 2016-05-22

## 2016-05-22 MED ORDER — ACETAMINOPHEN 650 MG RE SUPP: 650.0000 mg | Freq: Four times a day (QID) | RECTAL | Status: DC | PRN

## 2016-05-22 MED ORDER — ENOXAPARIN SODIUM 40 MG/0.4ML ~~LOC~~ SOLN
40.0000 mg | SUBCUTANEOUS | Status: DC
  Administered 2016-05-22 – 2016-06-01 (×11): 40 mg via SUBCUTANEOUS
  Filled 2016-05-22 (×11): qty 0.4

## 2016-05-22 MED ORDER — MORPHINE SULFATE (PF) 4 MG/ML IV SOLN
4.0000 mg | Freq: Once | INTRAVENOUS | Status: AC
  Administered 2016-05-22: 4 mg via INTRAVENOUS
  Filled 2016-05-22: qty 1

## 2016-05-22 MED ORDER — HYDROCOD POLST-CPM POLST ER 10-8 MG/5ML PO SUER
5.0000 mL | Freq: Two times a day (BID) | ORAL | Status: DC | PRN
  Administered 2016-05-29 – 2016-06-01 (×6): 5 mL via ORAL
  Filled 2016-05-22 (×6): qty 5

## 2016-05-22 MED ORDER — VITAMIN B-12 1000 MCG PO TABS
1000.0000 ug | ORAL_TABLET | Freq: Every day | ORAL | Status: DC
Start: 2016-05-23 — End: 2016-06-02
  Administered 2016-05-23 – 2016-06-01 (×10): 1000 ug via ORAL
  Filled 2016-05-22 (×10): qty 1

## 2016-05-22 MED ORDER — BISACODYL 5 MG PO TBEC
5.0000 mg | DELAYED_RELEASE_TABLET | Freq: Every day | ORAL | Status: DC | PRN
  Administered 2016-05-28 – 2016-06-01 (×2): 5 mg via ORAL
  Filled 2016-05-22 (×2): qty 1

## 2016-05-22 MED ORDER — LIDOCAINE HCL 2 % IJ SOLN
20.0000 mL | Freq: Once | INTRAMUSCULAR | Status: AC
  Administered 2016-05-22: 400 mg via INTRADERMAL
  Filled 2016-05-22: qty 20

## 2016-05-22 MED ORDER — SODIUM CHLORIDE 0.9% FLUSH
3.0000 mL | Freq: Two times a day (BID) | INTRAVENOUS | Status: DC
  Administered 2016-05-23 – 2016-06-01 (×9): 3 mL via INTRAVENOUS

## 2016-05-22 MED ORDER — SODIUM CHLORIDE 0.9% FLUSH
3.0000 mL | Freq: Two times a day (BID) | INTRAVENOUS | Status: DC
  Administered 2016-05-22 – 2016-05-30 (×8): 3 mL via INTRAVENOUS
  Administered 2016-05-31: 10 mL via INTRAVENOUS
  Administered 2016-06-01: 3 mL via INTRAVENOUS

## 2016-05-22 MED ORDER — SODIUM CHLORIDE 0.9 % IV SOLN: 250.0000 mL | INTRAVENOUS | Status: DC | PRN

## 2016-05-22 MED ORDER — KETOROLAC TROMETHAMINE 15 MG/ML IJ SOLN
15.0000 mg | Freq: Four times a day (QID) | INTRAMUSCULAR | Status: DC | PRN
  Administered 2016-05-22 – 2016-05-23 (×2): 15 mg via INTRAVENOUS
  Filled 2016-05-22 (×2): qty 1

## 2016-05-22 MED ORDER — OXYCODONE HCL 5 MG PO TABS
10.0000 mg | ORAL_TABLET | ORAL | Status: DC | PRN
  Administered 2016-05-22 – 2016-05-23 (×4): 10 mg via ORAL
  Filled 2016-05-22 (×6): qty 2

## 2016-05-22 MED ORDER — SODIUM CHLORIDE 0.9% FLUSH: 3.0000 mL | INTRAVENOUS | Status: DC | PRN

## 2016-05-22 MED ORDER — MORPHINE SULFATE (PF) 4 MG/ML IV SOLN
2.0000 mg | Freq: Once | INTRAVENOUS | Status: AC
  Administered 2016-05-22: 2 mg via INTRAVENOUS
  Filled 2016-05-22: qty 1

## 2016-05-22 NOTE — ED Notes (Signed)
Meal tray ordered for patient.

## 2016-05-22 NOTE — ED Notes (Signed)
Chest tube placed

## 2016-05-22 NOTE — CV Procedure (Signed)
Chest Tube Insertion Procedure Note  Indications:  Clinically significant Left Pneumothorax with complete collapse left lung and stage IIIA lung cancer  Pre-operative Diagnosis: Left Pneumothorax  Post-operative Diagnosis: Left Pneumothorax  Procedure Details  Informed consent was obtained for the procedure, including sedation.  Risks of lung perforation, hemorrhage, arrhythmia, and adverse drug reaction were discussed.   After sterile skin prep, using standard technique, a 20 French tube was placed in the left lateral 7th rib space.  Findings: None  Estimated Blood Loss:  Minimal         Specimens:  None              Complications:  None; patient tolerated the procedure well.         Disposition: admit          Condition: stable  Attending Attestation: I performed the procedure.

## 2016-05-22 NOTE — H&P (Signed)
[address]       [place] [address]             [phone]      Cardiothoracic Surgery Admission History and Physical  Douglas Kelly is an 46 y.o. male.   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 He had a large left hilar lung mass with left lung collapse and shift to the left. He was treated with concurrent chemoradiation by Dr. Tammi Klippel and Dr. Julien Nordmann. He says that he had a new CT performed yesterday by Dr. Tammi Klippel and he was called today and told to come to the ER due to a large left ptx with complete collapse of the left lung. I reviewed his CTA of the chest from 04/15/2016 and the PET from 05/12/2015. His left lung had partial collapse with postobstructive pneumonitis on the PET scan but there was no ptx.  CXR in the ER today shows complete collapse of the left lung and a large ptx with shift to the left. He says he felt like he was getting better but still had some left chest and shoulder pain.  Past Medical History:  Diagnosis Date  . Encounter for antineoplastic chemotherapy 05/14/2016  . Pneumonia     Past Surgical History:  Procedure Laterality Date  . VIDEO BRONCHOSCOPY Bilateral 04/19/2016   Procedure: VIDEO BRONCHOSCOPY WITHOUT FLUORO;  Surgeon: Juanito Doom, MD;  Location: [facility];  Service: Cardiopulmonary;  Laterality: Bilateral;    History reviewed. No pertinent family history. Social History:  reports that he quit smoking about 5 weeks ago. His smoking use included Cigarettes. He smoked 1.00 pack per day. He has never used smokeless tobacco. He reports that he does not drink alcohol or use drugs.  Allergies: No Known Allergies   (Not in a hospital admission)  Results for orders placed or performed during the hospital  encounter of 05/22/16 (from the past 48 hour(s))  CBC with Differential     Status: Abnormal   Collection Time: 05/22/16  3:30 PM  Result Value Ref Range   WBC 7.8 4.0 - 10.5 K/uL   RBC 4.16 (L) 4.22 - 5.81 MIL/uL   Hemoglobin 12.7 (L) 13.0 - 17.0 g/dL   HCT 37.2 (L) 39.0 - 52.0 %   MCV 89.4 78.0 - 100.0 fL   MCH 30.5 26.0 - 34.0 pg   MCHC 34.1 30.0 - 36.0 g/dL   RDW 14.7 11.5 - 15.5 %   Platelets 398 150 - 400 K/uL   Neutrophils Relative % 77 %   Neutro Abs 6.0 1.7 - 7.7 K/uL   Lymphocytes Relative 9 %   Lymphs Abs 0.7 0.7 - 4.0 K/uL   Monocytes Relative 7 %   Monocytes Absolute 0.5 0.1 - 1.0 K/uL   Eosinophils Relative 6 %   Eosinophils Absolute 0.5 0.0 - 0.7 K/uL   Basophils Relative 1 %   Basophils Absolute 0.1 0.0 - 0.1 K/uL  Basic metabolic panel     Status: Abnormal   Collection Time: 05/22/16  3:30 PM  Result Value Ref Range   Sodium 136 135 - 145 mmol/L   Potassium 4.7 3.5 - 5.1 mmol/L   Chloride 103 101 - 111 mmol/L   CO2 23 22 -  32 mmol/L   Glucose, Bld 107 (H) 65 - 99 mg/dL   BUN 15 6 - 20 mg/dL   Creatinine, Ser 0.77 0.61 - 1.24 mg/dL   Calcium 9.5 8.9 - 10.3 mg/dL   GFR calc non Af Amer >60 >60 mL/min   GFR calc Af Amer >60 >60 mL/min    Comment: (NOTE) The eGFR has been calculated using the CKD EPI equation. This calculation has not been validated in all clinical situations. eGFR's persistently <60 mL/min signify possible Chronic Kidney Disease.    Anion gap 10 5 - 15   Dg Chest Port 1 View  Result Date: 05/22/2016 CLINICAL DATA:  Left chest pain since May 2017. Shortness of breath. History of pneumonia, lung cancer and collapsed lung. EXAM: PORTABLE CHEST 1 VIEW COMPARISON:  04/18/2016 and PET-CT dated 05/11/2016. FINDINGS: Interval large left hemothorax without significant change in mediastinal shift to the left. The pneumothorax occupies approximately 50% of the volume of the duct in the thorax. The remainder of the left hemothorax consists of collapsed  lung and densely consolidated lung and recently demonstrated large left hilar mass. The right lung is clear. Stable mild scoliosis IMPRESSION: Interval approximately 50% left pneumothorax without significant change in mediastinal shift to the left. There is dense atelectasis and consolidation of the remainder of the left lung as well as the recently demonstrated large left hilar mass. Critical Value/emergent results were called by telephone at the time of interpretation on 05/22/2016 at 4:02 pm to Dr. Mayer Camel , who verbally acknowledged these results. Electronically Signed   By: Claudie Revering M.D.   On: 05/22/2016 16:04    Review of Systems  Constitutional: Positive for malaise/fatigue and weight loss. Negative for chills and fever.  HENT: Negative.   Eyes: Negative.   Respiratory: Positive for cough and shortness of breath. Negative for hemoptysis and sputum production.   Cardiovascular: Positive for chest pain. Negative for leg swelling.  Gastrointestinal: Negative.   Genitourinary: Negative.   Musculoskeletal: Negative.   Skin: Negative.   Neurological: Negative.   Endo/Heme/Allergies: Negative.   Psychiatric/Behavioral: Negative.     Blood pressure 120/89, pulse 106, temperature 98.4 F (36.9 C), temperature source Oral, resp. rate 23, SpO2 98 %. Physical Exam  Constitutional: He is oriented to person, place, and time. He appears well-developed and well-nourished. No distress.  HENT:  Head: Normocephalic and atraumatic.  Mouth/Throat: Oropharynx is clear and moist.  Eyes: EOM are normal. Pupils are equal, round, and reactive to light.  Neck: Normal range of motion. Neck supple. No thyromegaly present.  Cardiovascular: Normal rate, regular rhythm and normal heart sounds.   No murmur heard. Respiratory: Effort normal. No respiratory distress.  No breath sounds on the left  GI: Soft. Bowel sounds are normal. He exhibits no distension. There is no tenderness.  Musculoskeletal:  Normal range of motion. He exhibits no edema.  Lymphadenopathy:    He has no cervical adenopathy.  Neurological: He is alert and oriented to person, place, and time.  Skin: Skin is warm and dry.  Psychiatric: He has a normal mood and affect.    DG Chest [place] 1 View (Accession [id number]) (Order [id number])  Imaging  Date: 05/22/2016 Department: [facility] [place] [facility] Released By: Tommas Olp Authorizing: Mayer Camel, MD  Exam Information   Status Exam Begun  Exam Ended   Final [99] 05/22/2016 3:46 PM 05/22/2016 3:50 PM  PACS Images   Show images for DG Chest Port 1 View  Study  Result   CLINICAL DATA:  Left chest pain since May 2017. Shortness of breath. History of pneumonia, lung cancer and collapsed lung.  EXAM: PORTABLE CHEST 1 VIEW  COMPARISON:  04/18/2016 and PET-CT dated 05/11/2016.  FINDINGS: Interval large left hemothorax without significant change in mediastinal shift to the left. The pneumothorax occupies approximately 50% of the volume of the duct in the thorax. The remainder of the left hemothorax consists of collapsed lung and densely consolidated lung and recently demonstrated large left hilar mass. The right lung is clear. Stable mild scoliosis  IMPRESSION: Interval approximately 50% left pneumothorax without significant change in mediastinal shift to the left. There is dense atelectasis and consolidation of the remainder of the left lung as well as the recently demonstrated large left hilar mass.  Critical Value/emergent results were called by telephone at the time of interpretation on 05/22/2016 at 4:02 pm to Dr. Mayer Camel , who verbally acknowledged these results.   Electronically Signed   By: Claudie Revering M.D.   On: 05/22/2016 16:04     Assessment/Plan  He has a large left hilar squamous cell cancer, stage IIIA, with airway obstruction, collapse and postobstructive pneumonitis noted on his most recent  PET-CT on 05/11/2016. He now presents with complete left lung collapse and a large pneumothorax. I have inserted a chest tube and the pneumothorax is almost completely resolved but the left lung is still mostly opacified, most like due to airway obstruction. He will be admitted for chest tube management and we will need to discuss further treatment with oncology on Monday. He will probably need a follow up CT while here.  Gaye Pollack, MD 05/22/2016, 5:43 PM

## 2016-05-22 NOTE — ED Notes (Signed)
Unsuccessful attempt at giving report to 2W. Asked to call back at 0720, after shift change.

## 2016-05-22 NOTE — Progress Notes (Signed)
Pt arrived to 2w02 via stretcher. Tele box placed, CCMD verified x2. VSS. Patient c/o pain at chest tube site 9/10. Pain medicine given. Chest tube hooked up to suction. Girlfriend at bedside, pt updated on plan of care. Will continue to monitor.  Jaymes Graff, RN

## 2016-05-22 NOTE — Progress Notes (Signed)
On more detailed review of the new chest CT obtained yesterday in our radiation oncology planning unit, I note, the patient has now developed a large left pneumothorax.  I called him and advised that he proceed to the [facility] ED for imaging and probable chest tube or PleurX tube placement.  Below is a screenshot from a CT scan performed yesterday.  On arrival to the ED, I would recommend diagnostic Chest CT to confirm and thoracic surgery consult for CT tube if the ED and CT surgery team are in agreement.

## 2016-05-22 NOTE — ED Notes (Signed)
Chest tube tray at bedside.

## 2016-05-22 NOTE — ED Notes (Signed)
Surgeon at bedside placing chest tube.

## 2016-05-22 NOTE — ED Provider Notes (Signed)
[facility] DEPT Provider Note  CSN: [id number] Arrival date & time: 05/22/16  1514  History   Chief Complaint Chest Pain  HPI Douglas Kelly is a 46 y.o. male.  The history is provided by the patient, medical records and a relative. No language interpreter was used.  Chest Pain   This is a new problem. The current episode started more than 1 week ago. The problem occurs constantly. The problem has been gradually worsening. The pain is associated with rest. The pain is present in the lateral region. The pain is at a severity of 4/10. The pain is moderate. Associated symptoms include cough (improving). Pertinent negatives include no diaphoresis, no fever and no palpitations.    Past Medical History:  Diagnosis Date  . Encounter for antineoplastic chemotherapy 05/14/2016  . Pneumonia    Patient Active Problem List   Diagnosis Date Noted  . Pneumothorax 05/22/2016  . Stage III squamous cell carcinoma of left lung ([place]) 05/14/2016  . Encounter for antineoplastic chemotherapy 05/14/2016  . Protein-calorie malnutrition, severe 04/17/2016  . Collapse of left lung, hx of pneumonia 5 months ago 04/16/2016  . Pneumonia of left upper lobe due to infectious organism ([place])   . Postobstructive pneumonia 04/15/2016  . Normocytic anemia 04/15/2016  . LFTs abnormal 04/15/2016  . Smoker 04/15/2016   Past Surgical History:  Procedure Laterality Date  . VIDEO BRONCHOSCOPY Bilateral 04/19/2016   Procedure: VIDEO BRONCHOSCOPY WITHOUT FLUORO;  Surgeon: Juanito Doom, MD;  Location: [facility];  Service: Cardiopulmonary;  Laterality: Bilateral;    Home Medications    Prior to Admission medications   Medication Sig Start Date End Date Taking? Authorizing Provider  albuterol-ipratropium (COMBIVENT) 18-103 MCG/ACT inhaler Inhale 1 puff into the lungs every 4 (four) hours as needed for wheezing or shortness of breath. 04/23/16  Yes Janece Canterbury, MD  bisacodyl (BISACODYL) 5 MG EC tablet Take  1 tablet (5 mg total) by mouth daily as needed for moderate constipation. 04/23/16  Yes Janece Canterbury, MD  chlorpheniramine-HYDROcodone (TUSSIONEX) 10-8 MG/5ML SUER Take 5 mLs by mouth every 12 (twelve) hours as needed for cough. 04/28/16  Yes [name], MD  hyaluronate sodium (RADIAPLEXRX) GEL Apply 1 application topically 2 (two) times daily.   Yes Historical Provider, MD  oxyCODONE (OXY IR/ROXICODONE) 5 MG immediate release tablet Take 2 tablets (10 mg total) by mouth every 4 (four) hours as needed for severe pain. 05/14/16  Yes [name], MD  prochlorperazine (COMPAZINE) 10 MG tablet Take 1 tablet (10 mg total) by mouth every 6 (six) hours as needed for nausea or vomiting. 05/14/16  Yes Curt Bears, MD  vitamin B-12 1000 MCG tablet Take 1 tablet (1,000 mcg total) by mouth daily. 04/23/16  Yes Janece Canterbury, MD  nicotine (NICODERM CQ - DOSED IN MG/24 HOURS) 21 mg/24hr patch Place 1 patch (21 mg total) onto the skin daily. Patient not taking: Reported on 05/22/2016 04/23/16   Janece Canterbury, MD   Family History History reviewed. No pertinent family history.  Social History Social History  Substance Use Topics  . Smoking status: Former Smoker    Packs/day: 1.00    Types: Cigarettes    Quit date: 04/13/2016  . Smokeless tobacco: Never Used  . Alcohol use No   Allergies   Patient has no known allergies.   Review of Systems Review of Systems  Constitutional: Negative for diaphoresis and fever.  Respiratory: Positive for cough (improving).   Cardiovascular: Positive for chest pain. Negative for palpitations and leg  swelling.  All other systems reviewed and are negative.   Physical Exam Updated Vital Signs BP 120/89   Pulse 106   Temp 98.4 F (36.9 C) (Oral)   Resp 23   SpO2 98%   Physical Exam  Constitutional: He is oriented to person, place, and time. He appears well-developed and well-nourished.  HENT:  Head: Normocephalic and atraumatic.  Eyes:  Conjunctivae are normal.  Neck: Normal range of motion. Neck supple.  Cardiovascular: Regular rhythm.  Tachycardia present.   No murmur heard. Pulmonary/Chest: Effort normal and breath sounds normal. No respiratory distress.  Coarse breath sounds bilaterally, mildly decreased at left apex, breathing comfortably and maintaining sat's in mid 90's on RA  Abdominal: Soft. There is no tenderness.  Musculoskeletal: He exhibits no edema.  Neurological: He is alert and oriented to person, place, and time.  Skin: Skin is warm and dry.  Psychiatric: He has a normal mood and affect.  Nursing note and vitals reviewed.   ED Treatments / Results  Labs (all labs ordered are listed, but only abnormal results are displayed) Labs Reviewed  CBC WITH DIFFERENTIAL/PLATELET - Abnormal; Notable for the following:       Result Value   RBC 4.16 (*)    Hemoglobin 12.7 (*)    HCT 37.2 (*)    All other components within normal limits  BASIC METABOLIC PANEL - Abnormal; Notable for the following:    Glucose, Bld 107 (*)    All other components within normal limits    EKG  EKG Interpretation  Date/Time:  Saturday May 22 2016 15:31:34 EST Ventricular Rate:  102 PR Interval:    QRS Duration: 82 QT Interval:  327 QTC Calculation: 426 R Axis:   62 Text Interpretation:  Sinus tachycardia Anterior infarct, old Baseline wander in lead(s) V3 No STEMI.  Confirmed by LONG MD, JOSHUA [phone]) on 05/22/2016 3:34:47 PM      Radiology Dg Chest Port 1 View  Result Date: 05/22/2016 CLINICAL DATA:  Left chest pain since May 2017. Shortness of breath. History of pneumonia, lung cancer and collapsed lung. EXAM: PORTABLE CHEST 1 VIEW COMPARISON:  04/18/2016 and PET-CT dated 05/11/2016. FINDINGS: Interval large left hemothorax without significant change in mediastinal shift to the left. The pneumothorax occupies approximately 50% of the volume of the duct in the thorax. The remainder of the left hemothorax consists of  collapsed lung and densely consolidated lung and recently demonstrated large left hilar mass. The right lung is clear. Stable mild scoliosis IMPRESSION: Interval approximately 50% left pneumothorax without significant change in mediastinal shift to the left. There is dense atelectasis and consolidation of the remainder of the left lung as well as the recently demonstrated large left hilar mass. Critical Value/emergent results were called by telephone at the time of interpretation on 05/22/2016 at 4:02 pm to Dr. Mayer Camel , who verbally acknowledged these results. Electronically Signed   By: Claudie Revering M.D.   On: 05/22/2016 16:04    Procedures Procedures (including critical care time)  Medications Ordered in ED Medications  oxyCODONE (Oxy IR/ROXICODONE) immediate release tablet 10 mg (not administered)  prochlorperazine (COMPAZINE) tablet 10 mg (not administered)  chlorpheniramine-HYDROcodone (TUSSIONEX) 10-8 MG/5ML suspension 5 mL (not administered)  albuterol-ipratropium (COMBIVENT) inhaler 1 puff (not administered)  bisacodyl (DULCOLAX) EC tablet 5 mg (not administered)  vitamin B-12 (CYANOCOBALAMIN) tablet 1,000 mcg (not administered)  enoxaparin (LOVENOX) injection 40 mg (not administered)  sodium chloride flush (NS) 0.9 % injection 3 mL (not administered)  sodium  chloride flush (NS) 0.9 % injection 3 mL (not administered)  sodium chloride flush (NS) 0.9 % injection 3 mL (not administered)  0.9 %  sodium chloride infusion (not administered)  acetaminophen (TYLENOL) tablet 650 mg (not administered)    Or  acetaminophen (TYLENOL) suppository 650 mg (not administered)  ketorolac (TORADOL) 15 MG/ML injection 15 mg (not administered)  senna-docusate (Senokot-S) tablet 1 tablet (not administered)  morphine 4 MG/ML injection 2 mg (2 mg Intravenous Given 05/22/16 1703)  lidocaine (XYLOCAINE) 2 % (with pres) injection 400 mg (400 mg Intradermal Given 05/22/16 1704)  morphine 4 MG/ML  injection 4 mg (4 mg Intravenous Given 05/22/16 1718)  morphine 4 MG/ML injection 4 mg (4 mg Intravenous Given 05/22/16 1746)     Initial Impression / Assessment and Plan / ED Course  I have reviewed the triage vital signs and the nursing notes.  Clinical Course   46 yo male with above stated PMHx, HPI, and physical. Cough over past x2 months. Seen in [place] & diagnosed with PNA. Symptoms not improving on abx. CT scan concerning for lung CA. Undergoing multiple rounds of chemoradiation. Seen for normal visit yesterday and PET scan performed for treatment strategy planning for a left lung adenocarcinoma. Pt called today and informed that imaging showing PTX. Pt notes SOB & cough improved and chest pressure slightly worse on left.  EKG showing no ST elevation/depression or T wave inversions or interval abnormalities or dysrhythmias. Labs unremarkable. CXR showing near complete collapse on entire left lung with shifting of mediastinum. Laboratory and imaging results were personally reviewed by myself and used in the medical decision making of this patient's treatment and disposition.  CT surgery consulted and evaluated the patient in the ED - placed chest tube and admitted to their service. Pt understands and agrees with the plan and has no further questions or concerns.   Pt care discussed with and followed by my attending, Dr. Karn Cassis, MD Pager [phone]  Final Clinical Impressions(s) / ED Diagnoses   Final diagnoses:  Malignant neoplasm of left lung ([place])  Pneumothorax on left  Left sided chest pain  SOB (shortness of breath)   New Prescriptions New Prescriptions   No medications on file     Mayer Camel, MD 05/22/16 [place], MD 05/23/16 1208

## 2016-05-23 ENCOUNTER — Inpatient Hospital Stay (HOSPITAL_COMMUNITY): Payer: Medicaid Other

## 2016-05-23 MED ORDER — DIPHENHYDRAMINE HCL 50 MG/ML IJ SOLN: 12.5000 mg | Freq: Four times a day (QID) | INTRAMUSCULAR | Status: DC | PRN

## 2016-05-23 MED ORDER — OXYCODONE HCL 5 MG PO TABS
15.0000 mg | ORAL_TABLET | ORAL | Status: DC | PRN
  Administered 2016-05-23 – 2016-06-02 (×50): 15 mg via ORAL
  Filled 2016-05-23 (×51): qty 3

## 2016-05-23 MED ORDER — NALOXONE HCL 0.4 MG/ML IJ SOLN: 0.4000 mg | INTRAMUSCULAR | Status: DC | PRN

## 2016-05-23 MED ORDER — DIPHENHYDRAMINE HCL 12.5 MG/5ML PO ELIX: 12.5000 mg | ORAL_SOLUTION | Freq: Four times a day (QID) | ORAL | Status: DC | PRN

## 2016-05-23 MED ORDER — ONDANSETRON HCL 4 MG/2ML IJ SOLN
4.0000 mg | Freq: Four times a day (QID) | INTRAMUSCULAR | Status: DC | PRN
  Filled 2016-05-23: qty 2

## 2016-05-23 MED ORDER — SODIUM CHLORIDE 0.9% FLUSH: 9.0000 mL | INTRAVENOUS | Status: DC | PRN

## 2016-05-23 MED ORDER — FENTANYL 40 MCG/ML IV SOLN
INTRAVENOUS | Status: DC
  Administered 2016-05-23: 40 ug via INTRAVENOUS
  Administered 2016-05-23: 390 ug via INTRAVENOUS
  Administered 2016-05-24: 46 ug via INTRAVENOUS
  Administered 2016-05-24: 12:00:00 via INTRAVENOUS
  Administered 2016-05-24: 17 ug via INTRAVENOUS
  Administered 2016-05-24 (×3): via INTRAVENOUS
  Administered 2016-05-25: 14 ug via INTRAVENOUS
  Administered 2016-05-25: 30 ug via INTRAVENOUS
  Administered 2016-05-25 – 2016-05-26 (×2): via INTRAVENOUS
  Administered 2016-05-26: 425 ug via INTRAVENOUS
  Administered 2016-05-26: 0 ug via INTRAVENOUS
  Administered 2016-05-26: 255 ug via INTRAVENOUS
  Administered 2016-05-26: 40 ug via INTRAVENOUS
  Administered 2016-05-27: 195 ug via INTRAVENOUS
  Administered 2016-05-27: 135 ug via INTRAVENOUS
  Administered 2016-05-27: 1000 ug via INTRAVENOUS
  Administered 2016-05-27: 135 ug via INTRAVENOUS
  Administered 2016-05-27: 240 ug via INTRAVENOUS
  Administered 2016-05-27 – 2016-05-28 (×2): 255 ug via INTRAVENOUS
  Administered 2016-05-28: 12 ug via INTRAVENOUS
  Administered 2016-05-28: 125 ug via INTRAVENOUS
  Filled 2016-05-23 (×8): qty 25

## 2016-05-23 NOTE — Progress Notes (Addendum)
      [address]       [place] [address]             [phone]        Subjective:  Mr. Shaff complains of pain.  He states the pain medication is not working at all.  He uses chronic narcotics at home.  Objective: Vital signs in last 24 hours: Temp:  [98.1 F (36.7 C)-98.4 F (36.9 C)] 98.2 F (36.8 C) (01/14 0453) Pulse Rate:  [78-111] 78 (01/14 0453) Cardiac Rhythm: Normal sinus rhythm (01/14 0700) Resp:  [15-32] 16 (01/14 0453) BP: (100-128)/(67-101) 116/80 (01/14 0453) SpO2:  [94 %-98 %] 97 % (01/14 0453) Weight:  [143 lb 12.8 oz (65.2 kg)] 143 lb 12.8 oz (65.2 kg) (01/13 2040)  Intake/Output from previous day: 01/13 0701 - 01/14 0700 In: -  Out: 300 [Chest Tube:300]  General appearance: alert, cooperative and no distress Heart: regular rate and rhythm Lungs: diminished breath sounds left Abdomen: soft, non-tender; bowel sounds normal; no masses,  no organomegaly Wound: clean and dry  Lab Results:  Recent Labs  05/22/16 1530  WBC 7.8  HGB 12.7*  HCT 37.2*  PLT 398   BMET:  Recent Labs  05/22/16 1530  NA 136  K 4.7  CL 103  CO2 23  GLUCOSE 107*  BUN 15  CREATININE 0.77  CALCIUM 9.5    PT/INR: No results for input(s): LABPROT, INR in the last 72 hours. ABG No results found for: PHART, HCO3, TCO2, ACIDBASEDEF, O2SAT CBG (last 3)  No results for input(s): GLUCAP in the last 72 hours.  Assessment/Plan:  1. Stage IIIA Left Hilar Squamous Cell Lung Cancer- just started chemotherapy 2. Spontaneous pneumothorax on left, S/P Chest tube placement- chest tube is in good position, there is no air leak present, however the pneumothorax has increased from post chest tube film yesterday... Likely due to tumor involvement, may be able to try and increase suction to 30 cm, will speak with Dr. Cyndia Bent 3. Pain control- chronic narcotic use at home, will start fentanyl PCA and increase Oxy IR to 15 mg q4 prn 4. Dispo- patient with uncontrolled  pain, worsening pneumothorax... Will discuss further management with Dr. Cyndia Bent, pain medications adjusted, repeat CXR in AM   LOS: 1 day    BARRETT, ERIN 05/23/2016   Chart reviewed, patient examined, agree with above. CXR this am shows a small ptx but when I evaluated things the chest tube was clamped. After I unclamped it there was a rush of air and fluid. I thought the left lung looked more aerated this am compared to the post-placement CXR. Will repeat the CXR in the am and probably do a CT chest after that. He says he was scheduled for chemo Tuesday so will have to let Oncology know that he is here.

## 2016-05-24 ENCOUNTER — Ambulatory Visit: Payer: Medicaid Other

## 2016-05-24 ENCOUNTER — Inpatient Hospital Stay (HOSPITAL_COMMUNITY): Payer: Medicaid Other

## 2016-05-24 MED ORDER — ENSURE ENLIVE PO LIQD
237.0000 mL | Freq: Two times a day (BID) | ORAL | Status: DC
  Administered 2016-05-25 – 2016-06-01 (×11): 237 mL via ORAL

## 2016-05-24 NOTE — Progress Notes (Signed)
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  Pt s/p chest tube insertion 1/13. PO intake 100% per flowsheet records. Per wt readings, pt has had a 5% weight loss x 1 month. Likes chocolate Ensure Enlive supplements >> RD ordered. No muscle or subcutaneous fat depletion noticed.  Diet Order:  Diet regular Room service appropriate? Yes; Fluid consistency: Thin  Skin:  Reviewed, no issues  Last BM:  1/12  Height:   Ht Readings from Last 1 Encounters:  05/22/16 '5\' 8"'$  (1.727 m)    Weight:   Wt Readings from Last 1 Encounters:  05/22/16 143 lb 12.8 oz (65.2 kg)   Wt Readings from Last 20 Encounters:  05/22/16 143 lb 12.8 oz (65.2 kg)  05/21/16 146 lb 12.8 oz (66.6 kg)  05/14/16 151 lb 6.4 oz (68.7 kg)  05/07/16 151 lb 9.6 oz (68.8 kg)  04/30/16 150 lb (68 kg)  04/28/16 150 lb 6.4 oz (68.2 kg)  04/21/16 151 lb 7.3 oz (68.7 kg)  11/03/12 148 lb (67.1 kg)    Ideal Body Weight:  70 kg  BMI:  Body mass index is 21.86 kg/m.  Estimated Nutritional Needs:   Kcal:  2000-2200  Protein:  100-110 gm  Fluid:  2.0-2.2 L  EDUCATION NEEDS:   No education needs identified at this time  Arthur Holms, RD, LDN Pager #: [phone] After-Hours Pager #: [phone]

## 2016-05-24 NOTE — Progress Notes (Signed)
Responded to consult for advanced directive and explained/discussed form to/with pt. His sister joined [place] before end of our conversation, and pt said he'd like to review it and sign it tomorrow. She asked could we explain it again tomorrow if he had questions then before he signed it since he was on various medications. I assured them Yes. Provided spiritual/emotional support and prayer, which they appreciated. Chaplain available for f/u.    05/24/16 1400  Clinical Encounter Type  Visited With Patient and family together  Visit Type Initial;Psychological support;Spiritual support;Social support  Referral From Nurse  Spiritual Encounters  Spiritual Needs Literature;Brochure;Prayer;Emotional  Stress Factors  Patient Stress Factors Health changes;Loss of control  Family Stress Factors Family relationships;Health changes;Loss of control     05/24/16 1400  Clinical Encounter Type  Visited With Patient and family together  Visit Type Initial;Psychological support;Spiritual support;Social support  Referral From Nurse  Spiritual Encounters  Spiritual Needs Literature;Brochure;Prayer;Emotional  Stress Factors  Patient Stress Factors Health changes;Loss of control  Family Stress Factors Family relationships;Health changes;Loss of control   Gerrit Heck, Chaplain

## 2016-05-24 NOTE — Progress Notes (Signed)
I called and spoke with the nurse taking care of the patient to let him know we would hold radiotherapy until he is medically stable and likely hold until he is discharged from the hospital. It appears that he may be able to have his chest tube removed by Wednesday.     Carola Rhine, PAC

## 2016-05-24 NOTE — Progress Notes (Signed)
  Subjective: No complaints. Feels like he can breath easier. Doing 1500 on IS now.  Objective: Vital signs in last 24 hours: Temp:  [97.7 F (36.5 C)-98.1 F (36.7 C)] 98 F (36.7 C) (01/15 0358) Pulse Rate:  [69-109] 69 (01/15 0358) Cardiac Rhythm: Normal sinus rhythm (01/15 0701) Resp:  [17-20] 18 (01/15 0358) BP: (100-130)/(57-82) 100/57 (01/15 0358) SpO2:  [93 %-98 %] 94 % (01/15 0358)  Hemodynamic parameters for last 24 hours:    Intake/Output from previous day: 01/14 0701 - 01/15 0700 In: 0  Out: 315 [Urine:175; Chest Tube:140] Intake/Output this shift: No intake/output data recorded.  General appearance: alert and cooperative Heart: regular rate and rhythm, S1, S2 normal, no murmur, click, rub or gallop Lungs: clear to auscultation bilaterally no air leak from chest tube.  Lab Results:  Recent Labs  05/22/16 1530  WBC 7.8  HGB 12.7*  HCT 37.2*  PLT 398   BMET:  Recent Labs  05/22/16 1530  NA 136  K 4.7  CL 103  CO2 23  GLUCOSE 107*  BUN 15  CREATININE 0.77  CALCIUM 9.5    PT/INR: No results for input(s): LABPROT, INR in the last 72 hours. ABG No results found for: PHART, HCO3, TCO2, ACIDBASEDEF, O2SAT CBG (last 3)  No results for input(s): GLUCAP in the last 72 hours.  CXR: no ptx. Aeration of left lung much improved.  Assessment/Plan:  His left lung looks much better today and he is symptomatically better. Will decrease suction to 10 cm and work on IS, ambulation. Plan to turn to water seal tomorrow if CXR ok and get tube out Wed hopefully. He was suppose to have chemo tomorrow but it will have to be delayed.   LOS: 2 days    Gaye Pollack 05/24/2016

## 2016-05-25 ENCOUNTER — Ambulatory Visit: Payer: Medicaid Other

## 2016-05-25 ENCOUNTER — Inpatient Hospital Stay (HOSPITAL_COMMUNITY): Payer: Medicaid Other

## 2016-05-25 ENCOUNTER — Other Ambulatory Visit: Payer: Self-pay

## 2016-05-25 ENCOUNTER — Ambulatory Visit: Payer: Self-pay

## 2016-05-25 NOTE — Progress Notes (Signed)
  Subjective:  No complaints. Doing 1500 on IS, ambulating.  Objective: Vital signs in last 24 hours: Temp:  [97.7 F (36.5 C)-97.9 F (36.6 C)] 97.9 F (36.6 C) (01/15 2147) Pulse Rate:  [87-96] 87 (01/16 0511) Cardiac Rhythm: Normal sinus rhythm (01/16 0700) Resp:  [13-22] 13 (01/16 0511) BP: (107-116)/(67-75) 116/67 (01/16 0511) SpO2:  [90 %-95 %] 92 % (01/16 0511) FiO2 (%):  [51 %-85 %] 77 % (01/16 0400)  Hemodynamic parameters for last 24 hours:    Intake/Output from previous day: 01/15 0701 - 01/16 0700 In: 720 [P.O.:720] Out: 150 [Urine:150] Intake/Output this shift: No intake/output data recorded.  General appearance: alert and cooperative Heart: regular rate and rhythm, S1, S2 normal, no murmur, click, rub or gallop Lungs: clear to auscultation bilaterally no air leak from chest tube  Lab Results:  Recent Labs  05/22/16 1530  WBC 7.8  HGB 12.7*  HCT 37.2*  PLT 398   BMET:  Recent Labs  05/22/16 1530  NA 136  K 4.7  CL 103  CO2 23  GLUCOSE 107*  BUN 15  CREATININE 0.77  CALCIUM 9.5    PT/INR: No results for input(s): LABPROT, INR in the last 72 hours. ABG No results found for: PHART, HCO3, TCO2, ACIDBASEDEF, O2SAT CBG (last 3)  No results for input(s): GLUCAP in the last 72 hours.  Assessment/Plan:  CXR this am unchanged. There is no ptx and fairly good aeration of the left lung with hilar mass. Will put to water seal and if no ptx in am will remove the tube. He could go home after that and will follow up in the office next week with a CXR and remove CT suture.   LOS: 3 days    Gaye Pollack 05/25/2016

## 2016-05-25 NOTE — Progress Notes (Signed)
Patient became very rude and verbally inappropriate to nurse because nurse explained to patient that is not yet time to receive another pain medication.Time for another pain medication is written on the board in the room. Patient on PCA pump also. Nurse will go back with patient medication when it is time.

## 2016-05-26 ENCOUNTER — Ambulatory Visit: Payer: Medicaid Other

## 2016-05-26 ENCOUNTER — Inpatient Hospital Stay (HOSPITAL_COMMUNITY): Payer: Medicaid Other

## 2016-05-26 NOTE — Progress Notes (Signed)
2W charge RN called me to come to room 2W02 to assess patient's chest tube. Per patient's nurse Sharrie Rothman, she has seen the "tidaling" since she arrived for her shift at 7pm. Upon arrival to room, patient laying down in bed, in no acute distress, complaining of moderate pain around chest tube site. Assessment of Douglas Kelly shows large amount of tidaling; asked him to take a deep breath and cough- 5 chamber air leak noted. Asked patient to breath normally- 1-2 chamber intermittent air leak noted. Asked patient to take deep breaths 1-5 chamber intermittent air leak noted.  Chest tube is to water seal. Per patient and patient's nurse, chest tube was placed on water seal today. Took down chest tube dressing to assess site. Chest tube intact. Redressed chest tube with vaseline gauze and tightly taped. Carolynn Sayers RN to obtain morning chest xray, and to call Dr. Roxy Manns to inform him of changes. Instructed her to call me if needed. Richarda Blade RN

## 2016-05-26 NOTE — Progress Notes (Signed)
Pt's chest tube has shown sign of large air leak in the water chamber.  Pt is not in distress or having trouble breathing. Tube connections were checked and nothing was misplaced.  Charge nurse was informed and, after checking the pt himself, called the charge nurse from [place] to check him out. After assessing the chest tube and putting on a new dressing at the site, the [place] charge nurse informed to call X-Ray and have the pt sent down now for his CXR and to inform Dr. Roxy Manns when results are in.  Will continue to monitor pt.  Lupita Dawn, RN

## 2016-05-26 NOTE — Progress Notes (Addendum)
      [address]       [place] [address]             [phone]      Subjective:  Mr. Wamser has no new complaints.  Remains anxious to get out of here.  Events overnight noted  Objective: Vital signs in last 24 hours: Temp:  [97.6 F (36.4 C)-98.4 F (36.9 C)] 97.6 F (36.4 C) (01/17 0436) Pulse Rate:  [92-103] 93 (01/17 0436) Cardiac Rhythm: Sinus tachycardia (01/16 1900) Resp:  [16-19] 18 (01/17 0436) BP: (103-124)/(58-86) 103/72 (01/17 0436) SpO2:  [92 %-97 %] 97 % (01/17 0436) FiO2 (%):  [91 %-93 %] 93 % (01/17 0009)  Intake/Output from previous day: 01/16 0701 - 01/17 0700 In: 720 [P.O.:720] Out: 495 [Urine:275; Chest Tube:220]  General appearance: alert, cooperative and no distress Heart: regular rate and rhythm Lungs: clear to auscultation bilaterally Abdomen: soft, non-tender; bowel sounds normal; no masses,  no organomegaly Wound: clean and dry  Lab Results: No results for input(s): WBC, HGB, HCT, PLT in the last 72 hours. BMET: No results for input(s): NA, K, CL, CO2, GLUCOSE, BUN, CREATININE, CALCIUM in the last 72 hours.  PT/INR: No results for input(s): LABPROT, INR in the last 72 hours. ABG No results found for: PHART, HCO3, TCO2, ACIDBASEDEF, O2SAT CBG (last 3)  No results for input(s): GLUCAP in the last 72 hours.  Assessment/Plan:  1. Chest tube- + air leak with cough, compress dressing air leak improves-- likely leave tube in place 2. Pulm- CXR shows possible development of posterior pneumothorax, air fluid level 3. Pain- continue PCA, oral medications 4. Dispo- patient asymptomatic this morning, continued air leak, questionable development of posterior component pneumothorax- leave tube in for now.. Dr. Cyndia Bent will assess   LOS: 4 days    Ellwood Handler 05/26/2016   Chart reviewed, patient examined, agree with above. Events of last night noted. I changed the dressing and there is no air leak around the tube. He has an  air leak this am in the bubble chamber and CXR shows a small ptx. I suspect that whatever caused the ptx in the first place is opening and closing. 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 I would keep the tube to water seal and hopefully this will seal off permanently. If not he may require VATS for attempt at closure and pleurodesis although it may not be possible to do much with the lung. Discussed plans with him and his wife.

## 2016-05-26 NOTE — Care Management Note (Signed)
Case Management Note Marvetta Gibbons RN, BSN Unit 2W-Case Manager [phone]  Patient Details  Name: Douglas Kelly MRN: [id number] Date of Birth: [birth year]  Subjective/Objective:   Pt admitted with pntx- chest tube placed- hx of lung CA- currently doing chemo                 Action/Plan: PTA pt lived at home- anticipate return home- CM to follow for d/c needs  Expected Discharge Date:                 Expected Discharge Plan:  Home/Self Care  In-House Referral:     Discharge planning Services  CM Consult, Medication Assistance  Post Acute Care Choice:    Choice offered to:     DME Arranged:    DME Agency:     HH Arranged:    HH Agency:     Status of Service:  In process, will continue to follow  If discussed at Long Length of Stay Meetings, dates discussed:    Additional Comments:  Dawayne Patricia, RN 05/26/2016, 10:42 AM

## 2016-05-26 NOTE — Progress Notes (Signed)
Chest tube remains in place. Per Shona Simpson, PA-C no radiation today. Informed Trudee Kuster, RT on L3 of this finding.

## 2016-05-27 ENCOUNTER — Telehealth: Payer: Self-pay | Admitting: *Deleted

## 2016-05-27 ENCOUNTER — Inpatient Hospital Stay (HOSPITAL_COMMUNITY): Payer: Medicaid Other

## 2016-05-27 ENCOUNTER — Ambulatory Visit: Payer: Medicaid Other

## 2016-05-27 ENCOUNTER — Telehealth: Payer: Self-pay | Admitting: Radiation Oncology

## 2016-05-27 NOTE — Telephone Encounter (Signed)
Unable to reach patient I phoned his girlfriend, Almyra Free. Explained that radiation will be on hold until he is discharged from the hospital and his chest tube is removed. She verbalized understanding and committed to explain this to the patient, again. Will continue to check on patient's status regularly.

## 2016-05-27 NOTE — Telephone Encounter (Signed)
"  I received a call and could not understand the recording.  Could Dr. Johny Shears nurse call me.  I need to know what's going on.  I have this tube in my chest and I do not think radiation is an option at this time."

## 2016-05-27 NOTE — Discharge Summary (Signed)
Physician Discharge Summary  Patient ID: NICKLOUS ABURTO MRN: [id number] DOB/AGE: 1971-04-09 46 y.o.  Admit date: 05/22/2016 Discharge date: 06/02/2016  Admission Diagnoses:pneumothorax  Discharge Diagnoses:  Active Problems:   Pneumothorax   Patient Active Problem List   Diagnosis Date Noted  . Pneumothorax 05/22/2016  . Stage III squamous cell carcinoma of left lung ([place]) 05/14/2016  . Encounter for antineoplastic chemotherapy 05/14/2016  . Protein-calorie malnutrition, severe 04/17/2016  . Collapse of left lung, hx of pneumonia 5 months ago 04/16/2016  . Pneumonia of left upper lobe due to infectious organism ([place])   . Postobstructive pneumonia 04/15/2016  . Normocytic anemia 04/15/2016  . LFTs abnormal 04/15/2016  . Smoker 04/15/2016    HPI:   The patient is a 46 year old previous smoker with stage IIIA squamous cell carcinoma of the left lung diagnosed in 04/2016 when the  Patient presented with almost complete collapse of the left lung. He had a large left hilar lung mass with left lung collapse and shift to the left. He was treated with concurrent chemoradiation by Dr. Tammi Klippel and Dr. Julien Nordmann. He says that he had a new CT performed yesterday by Dr. Tammi Klippel and he was called today and told to come to the ER due to a large left ptx with complete collapse of the left lung. I reviewed his CTA of the chest from 04/15/2016 and the PET from 05/12/2015. His left lung had partial collapse with postobstructive pneumonitis on the PET scan but there was no ptx.  CXR in the ER today shows complete collapse of the left lung and a large ptx with shift to the left. He says he felt like he was getting better but still had some left chest and shoulder pain.    Discharged Condition: fair  Hospital Course: A chest tube was placed by Dr. Cyndia Bent and the patient was admitted for further management. His air leak has stopped however he continues to have a small pneumothorax. His chest tube was removed on  05/28/2016. Unfortunately, his follow CXR showed recurrent large pneumothorax.  He required replacement of his previous chest tube.  Follow up CXR showed improvement of pneumothorax.  However, with his Lung cancer he is unlikely to have complete re-expansion of the lung.  This chest tube was transitioned to a Mini Express on 06/01/2016.  Repeat CXR was obtained and showed stable appearance of pneumothorax.  He is felt medically stable today and we will follow him closely in the office to hopefully remove chest tube at a later date.  Consults: None  Significant Diagnostic Studies: serial CXR's  Treatments: chest tube placement(L)   Disposition: 01-Home or Self Care   Discharge Medications:   Allergies as of 06/02/2016   No Known Allergies     Medication List    TAKE these medications   acetaminophen 325 MG tablet Commonly known as:  TYLENOL Take 2 tablets (650 mg total) by mouth every 6 (six) hours as needed for mild pain (or Fever >/= 101).   albuterol-ipratropium 18-103 MCG/ACT inhaler Commonly known as:  COMBIVENT Inhale 1 puff into the lungs every 4 (four) hours as needed for wheezing or shortness of breath.   bisacodyl 5 MG EC tablet Commonly known as:  bisacodyl Take 1 tablet (5 mg total) by mouth daily as needed for moderate constipation.   chlorpheniramine-HYDROcodone 10-8 MG/5ML Suer Commonly known as:  TUSSIONEX Take 5 mLs by mouth every 12 (twelve) hours as needed for cough.   cyanocobalamin 1000 MCG tablet Take  1 tablet (1,000 mcg total) by mouth daily.   fentaNYL 50 MCG/HR Commonly known as:  DURAGESIC Place 1 patch (50 mcg total) onto the skin every 3 (three) days.   hyaluronate sodium Gel Apply 1 application topically 2 (two) times daily.   nicotine 21 mg/24hr patch Commonly known as:  NICODERM CQ - dosed in mg/24 hours Place 1 patch (21 mg total) onto the skin daily.   oxyCODONE 15 MG immediate release tablet Commonly known as:  ROXICODONE Take 1  tablet (15 mg total) by mouth every 4 (four) hours as needed for severe pain. What changed:  medication strength  how much to take   prochlorperazine 10 MG tablet Commonly known as:  COMPAZINE Take 1 tablet (10 mg total) by mouth every 6 (six) hours as needed for nausea or vomiting.      Follow-up Information    Gaye Pollack, MD Follow up on 06/09/2016.   Specialty:  Cardiothoracic Surgery Why:  Appointment is at 9:30, please get CXR at 9:00 located on first floor of our office building Contact information: [place] [address] [phone]           Signed: Ellwood Handler 06/02/2016, 7:47 AM

## 2016-05-27 NOTE — Progress Notes (Addendum)
      [address]       [place] [address]             [phone]      Subjective:  Douglas Kelly has no new complaints.  Continues to want his chest tube out.  Objective: Vital signs in last 24 hours: Temp:  [97.4 F (36.3 C)-98.3 F (36.8 C)] 97.4 F (36.3 C) (01/18 0451) Pulse Rate:  [77-91] 77 (01/18 0451) Cardiac Rhythm: Normal sinus rhythm (01/17 1900) Resp:  [16-20] 16 (01/18 0451) BP: (97-105)/(53-67) 104/67 (01/18 0451) SpO2:  [96 %-99 %] 97 % (01/18 0451) FiO2 (%):  [95 %] 95 % (01/17 1200)  Intake/Output from previous day: 01/17 0701 - 01/18 0700 In: 1440 [P.O.:1440] Out: 1050 [Urine:1050]  General appearance: alert, cooperative and no distress Heart: regular rate and rhythm Lungs: diminished breath sounds bibasilar Abdomen: soft, non-tender; bowel sounds normal; no masses,  no organomegaly Extremities: extremities normal, atraumatic, no cyanosis or edema Wound: clean and dry  Lab Results: No results for input(s): WBC, HGB, HCT, PLT in the last 72 hours. BMET: No results for input(s): NA, K, CL, CO2, GLUCOSE, BUN, CREATININE, CALCIUM in the last 72 hours.  PT/INR: No results for input(s): LABPROT, INR in the last 72 hours. ABG No results found for: PHART, HCO3, TCO2, ACIDBASEDEF, O2SAT CBG (last 3)  No results for input(s): GLUCAP in the last 72 hours.  Assessment/Plan:  1. Chest tube- +1 air leak vs. tidaling- on water seal, CXR is pending 2. Pain- on PCA, oral medications 3. dispo- patient stable, will review CXR once completed, continue current care   LOS: 5 days    Ahmed Prima, ERIN 05/27/2016   Chart reviewed, patient examined, agree with above. CXR looks ok with tiny left apical ptx but good aeration. I don't see an air leak tonight, just tidaling. Will get CXR in the am and if ok and no air leak will plan to remove tube.

## 2016-05-27 NOTE — Discharge Instructions (Signed)
Pneumothorax Introduction A pneumothorax, commonly called a collapsed lung, is a condition in which air leaks from a lung and builds up in the space between the lung and the chest wall (pleural space). The air in a pneumothorax is trapped outside the lung and takes up space, preventing the lung from fully expanding. This is a condition that usually occurs suddenly. The buildup of air may be small or large. A small pneumothorax may go away on its own. When a pneumothorax is larger, it will often require medical treatment and hospitalization. What are the causes? A pneumothorax can sometimes happen quickly with no apparent cause. People with underlying lung problems, particularly COPD or emphysema, are at higher risk of pneumothorax. However, pneumothorax can happen quickly even in people with no prior known lung problems. Trauma, surgery, medical procedures, or injury to the chest wall can also cause a pneumothorax. What are the signs or symptoms? Sometimes a pneumothorax will have no symptoms. When symptoms are present, they can include:  Chest pain.  Shortness of breath.  Increased rate of breathing.  Bluish color to your lips or skin (cyanosis). How is this diagnosed? Pneumothorax is usually diagnosed by a chest X-ray or chest CT scan. Your health care provider will also take a medical history and perform a physical exam to determine why you may have a pneumothorax. How is this treated? A small pneumothorax may go away on its own without treatment. Extra oxygen can sometimes help a small pneumothorax go away more quickly. For a larger pneumothorax or a pneumothorax that is causing symptoms, a procedure is usually needed to drain the air.In some cases, the health care provider may drain the air using a needle. In other cases, a chest tube may be inserted into the pleural space. A chest tube is a small tube placed between the ribs and into the pleural space. This removes the extra air and allows  the lung to expand back to its normal size. A large pneumothorax will usually require a hospital stay. If there is ongoing air leakage into the pleural space, then the chest tube may need to remain in place for several days until the air leak has healed. In some cases, surgery may be needed. Follow these instructions at home:  Only take over-the-counter or prescription medicines as directed by your health care provider.  If a cough or pain makes it difficult for you to sleep at night, try sleeping in a semi-upright position in a recliner or by using 2 or 3 pillows.  Rest and limit activity as directed by your health care provider.  If you had a chest tube and it was removed, ask your health care provider when it is okay to remove the dressing. Until your health care provider says you can remove the dressing, do not allow it to get wet.  Do not smoke. Smoking is a risk factor for pneumothorax.  Do not fly in an airplane or scuba dive until your health care provider says it is okay.  Follow up with your health care provider as directed. Get help right away if:  You have increasing chest pain or shortness of breath.  You have a cough that is not controlled with suppressants.  You begin coughing up blood.  You have pain that is getting worse or is not controlled with medicines.  You cough up thick, discolored mucus (sputum) that is yellow to green in color.  You have redness, increasing pain, or discharge at the site where a  chest tube had been in place (if your pneumothorax was treated with a chest tube).  The site where your chest tube was located opens up.  You feel air coming out of the site where the chest tube was placed.  You have a fever or persistent symptoms for more than 2-3 days.  You have a fever and your symptoms suddenly get worse. This information is not intended to replace advice given to you by your health care provider. Make sure you discuss any questions you have  with your health care provider. Document Released: 04/26/2005 Document Revised: 10/02/2015 Document Reviewed: 09/19/2013  2017 [employer]vier

## 2016-05-28 ENCOUNTER — Ambulatory Visit: Payer: Medicaid Other

## 2016-05-28 ENCOUNTER — Inpatient Hospital Stay (HOSPITAL_COMMUNITY): Payer: Medicaid Other

## 2016-05-28 ENCOUNTER — Ambulatory Visit: Payer: Self-pay | Admitting: Radiation Oncology

## 2016-05-28 NOTE — Progress Notes (Signed)
Spoke with Trudee Kuster, RT on L3. Explained the patient remains hospitalized with chest tube in thus, radiation treatment for today will need to be cancelled.

## 2016-05-28 NOTE — Progress Notes (Signed)
Patient just returned from walking to the nurses desk. Pain med to be given. No other needs at this time, call light within reach

## 2016-05-28 NOTE — Progress Notes (Signed)
      [address]       [place] [address]             [phone]     Subjective:  No new complaints.  Wants chest tube out.  Objective: Vital signs in last 24 hours: Temp:  [97.4 F (36.3 C)-98 F (36.7 C)] 98 F (36.7 C) (01/19 0428) Pulse Rate:  [73-82] 77 (01/19 0428) Cardiac Rhythm: Normal sinus rhythm (01/18 1900) Resp:  [18-20] 18 (01/19 0428) BP: (107-115)/(65-71) 107/68 (01/19 0428) SpO2:  [95 %-99 %] 99 % (01/19 0428) FiO2 (%):  [96 %-98 %] 98 % (01/18 2000)  Intake/Output from previous day: 01/18 0701 - 01/19 0700 In: 53 [P.O.:960] Out: -   General appearance: alert, cooperative and no distress Heart: regular rate and rhythm Lungs: clear to auscultation bilaterally Abdomen: soft, non-tender; bowel sounds normal; no masses,  no organomegaly Wound: clean and dry  Lab Results: No results for input(s): WBC, HGB, HCT, PLT in the last 72 hours. BMET: No results for input(s): NA, K, CL, CO2, GLUCOSE, BUN, CREATININE, CALCIUM in the last 72 hours.  PT/INR: No results for input(s): LABPROT, INR in the last 72 hours. ABG No results found for: PHART, HCO3, TCO2, ACIDBASEDEF, O2SAT CBG (last 3)  No results for input(s): GLUCAP in the last 72 hours.  Assessment/Plan:  1. CV- Chest tube- no air leak present, CXR shows mild improvement of pneumothorax on CXR 2. Pain- d/c PCA, obtain pain control with oral medications 3. Dispo- patient stable, will d/c chest tube, repeat CXR in AM if stable will plan to d/c home   LOS: 6 days    Ahmed Prima, [facility] 05/28/2016

## 2016-05-28 NOTE — Progress Notes (Signed)
Patient's chest tube removed without difficulty.  Will continue to monitor.

## 2016-05-29 ENCOUNTER — Inpatient Hospital Stay (HOSPITAL_COMMUNITY): Payer: Medicaid Other

## 2016-05-29 MED ORDER — MIDAZOLAM HCL 2 MG/2ML IJ SOLN
2.0000 mg | Freq: Once | INTRAMUSCULAR | Status: AC
  Administered 2016-05-29: 2 mg via INTRAVENOUS
  Filled 2016-05-29: qty 2

## 2016-05-29 MED ORDER — TRAMADOL HCL 50 MG PO TABS
50.0000 mg | ORAL_TABLET | Freq: Four times a day (QID) | ORAL | Status: DC | PRN
  Administered 2016-05-29 – 2016-05-30 (×5): 50 mg via ORAL
  Filled 2016-05-29 (×5): qty 1

## 2016-05-29 MED ORDER — LIDOCAINE HCL (PF) 1 % IJ SOLN
INTRAMUSCULAR | Status: AC
  Administered 2016-05-29: 30 mL
  Filled 2016-05-29: qty 30

## 2016-05-29 MED ORDER — MORPHINE SULFATE (PF) 2 MG/ML IV SOLN
2.0000 mg | INTRAVENOUS | Status: DC | PRN
  Administered 2016-05-29 (×3): 2 mg via INTRAVENOUS
  Filled 2016-05-29 (×3): qty 1

## 2016-05-29 MED ORDER — FENTANYL CITRATE (PF) 100 MCG/2ML IJ SOLN
12.5000 ug | INTRAMUSCULAR | Status: DC | PRN
  Administered 2016-05-29: 50 ug via INTRAVENOUS
  Administered 2016-05-29 (×2): 25 ug via INTRAVENOUS
  Administered 2016-05-29 – 2016-06-02 (×35): 50 ug via INTRAVENOUS
  Filled 2016-05-29 (×37): qty 2

## 2016-05-29 NOTE — Progress Notes (Addendum)
      [address]       [place], [address]             [phone]           Subjective: Patient denies change in breathing. He wants to go home.  Objective: Vital signs in last 24 hours: Temp:  [97.8 F (36.6 C)-98.2 F (36.8 C)] 97.8 F (36.6 C) (01/20 0522) Pulse Rate:  [74-90] 90 (01/20 0522) Cardiac Rhythm: Normal sinus rhythm (01/19 1900) Resp:  [17-18] 18 (01/20 0522) BP: (102-113)/(58-72) 113/68 (01/20 0522) SpO2:  [95 %-98 %] 97 % (01/20 0522)      Intake/Output from previous day: 01/19 0701 - 01/20 0700 In: 840 [P.O.:840] Out: -    Physical Exam:  Cardiovascular: RRR Pulmonary: Clear to auscultation on right and very diminished on right. Wounds: Dressing is clean and dry.    Lab Results: CBC:No results for input(s): WBC, HGB, HCT, PLT in the last 72 hours. BMET: No results for input(s): NA, K, CL, CO2, GLUCOSE, BUN, CREATININE, CALCIUM in the last 72 hours.  PT/INR: No results for input(s): LABPROT, INR in the last 72 hours. ABG:  INR: Will add last result for INR, ABG once components are confirmed Will add last 4 CBG results once components are confirmed  Assessment/Plan:  1. CV - SR in the 90's 2.  Pulmonary - Chest tube removed yesterday. CXR this am shows a large left sided pneumothorax. Likely will require another chest tube.   ZIMMERMAN,DONIELLE MPA-C 05/29/2016,7:14 AM  I have seen and examined the patient and agree with the assessment and plan as outlined.  Left lung has collapsed since tube removed yesterday.  Chest tube needs to be replaced.  I discussed the results of CXR and options for treatment with patient.  All questions answered.  He provides full informed consent for replacement of left chest tube.  I spent in excess of 15 minutes during the conduct of this hospital encounter and >50% of this time involved direct face-to-face encounter with the patient for counseling and/or coordination of their  care.   Rexene Alberts, MD 05/29/2016 10:40 AM

## 2016-05-29 NOTE — Progress Notes (Signed)
Pt care taken over from [facility]. Pt given pain medication after complaints of 10/10 pain at CT. CT connected to suction - marked at 21m. Call bell within reach, visitor at bedside, will continue to monitor.   CFritz Pickerel RN

## 2016-05-29 NOTE — Op Note (Signed)
CARDIOTHORACIC SURGERY OPERATIVE NOTE  Date of Procedure:  05/29/2016  Preoperative Diagnosis: Recurrent Left Pneumothorax  Postoperative Diagnosis: Same  Procedure:   Left chest tube placement  Surgeon:   Valentina Gu. Roxy Manns, MD  Anesthesia: 1% lidocaine local with intravenous sedation    DETAILS OF THE OPERATIVE PROCEDURE  Following full informed consent the patient was given midazolam 2 mg and morphine sulfate 2 mg intravenously and continuously monitored for rhythm, BP and oxygen saturation. The left chest was prepared and draped in a sterile manner. 1% lidocaine was utilized to anesthetize the skin and subcutaneous tissues. A small incision was made and a 28 French straight chest tube was placed through the incision into the pleural space. The tube was secured to the skin and connected to a closed suction collection device. The patient tolerated the procedure well. A portable CXR was ordered. There were no complications.    Valentina Gu. Roxy Manns, MD 05/29/2016 12:30 PM

## 2016-05-30 ENCOUNTER — Inpatient Hospital Stay (HOSPITAL_COMMUNITY): Payer: Medicaid Other

## 2016-05-30 MED ORDER — IOPAMIDOL (ISOVUE-300) INJECTION 61%
INTRAVENOUS | Status: AC
  Administered 2016-05-30: 75 mL via INTRAVENOUS
  Filled 2016-05-30: qty 75

## 2016-05-30 MED ORDER — DOCUSATE SODIUM 100 MG PO CAPS
100.0000 mg | ORAL_CAPSULE | Freq: Every day | ORAL | Status: DC
  Administered 2016-05-30 – 2016-06-01 (×3): 100 mg via ORAL
  Filled 2016-05-30 (×3): qty 1

## 2016-05-30 NOTE — Progress Notes (Addendum)
      [address]       [place], [address]             [phone]           Subjective: Patient's pain at chest tube site was not well controlled with Morphine so I stopped it last evening and Fentanyl seems to be working better.  Objective: Vital signs in last 24 hours: Temp:  [97.4 F (36.3 C)-98 F (36.7 C)] 97.4 F (36.3 C) (01/21 0517) Pulse Rate:  [89-92] 89 (01/21 0517) Cardiac Rhythm: Sinus tachycardia (01/20 1930) Resp:  [18-20] 18 (01/21 0517) BP: (105-122)/(72-80) 107/78 (01/21 0517) SpO2:  [97 %-98 %] 97 % (01/21 0517)      Intake/Output from previous day: 01/20 0701 - 01/21 0700 In: 1040 [P.O.:960; I.V.:80] Out: 120 [Chest Tube:120]   Physical Exam:  Cardiovascular: RRR Pulmonary: Clear to auscultation on right and diminished at right apex Wounds: Dressing is clean and dry.    Lab Results: CBC:No results for input(s): WBC, HGB, HCT, PLT in the last 72 hours. BMET: No results for input(s): NA, K, CL, CO2, GLUCOSE, BUN, CREATININE, CALCIUM in the last 72 hours.  PT/INR: No results for input(s): LABPROT, INR in the last 72 hours. ABG:  INR: Will add last result for INR, ABG once components are confirmed Will add last 4 CBG results once components are confirmed  Assessment/Plan:  1. CV - SR in the 90's 2.  Pulmonary -S/p left chest tube placement yesterday. Chest tube is to suction. There is an air leak. CXR this am appears to show small left apical pneumothorax. On room air.   ZIMMERMAN,DONIELLE MPA-C 05/30/2016,7:30 AM  I have seen and examined the patient and agree with the assessment and plan as outlined.  Will order CT chest to assist w/ decision making regarding management of recurrent spontaneous PTX  I spent in excess of 15 minutes during the conduct of this hospital encounter and >50% of this time involved direct face-to-face encounter with the patient for counseling and/or coordination of their care.  Rexene Alberts,  MD 05/30/2016 10:54 AM

## 2016-05-31 ENCOUNTER — Other Ambulatory Visit: Payer: Self-pay

## 2016-05-31 ENCOUNTER — Ambulatory Visit: Payer: Medicaid Other

## 2016-05-31 ENCOUNTER — Inpatient Hospital Stay (HOSPITAL_COMMUNITY): Payer: Medicaid Other

## 2016-05-31 ENCOUNTER — Ambulatory Visit: Payer: Self-pay | Admitting: Internal Medicine

## 2016-05-31 MED ORDER — KETOROLAC TROMETHAMINE 15 MG/ML IJ SOLN
15.0000 mg | Freq: Four times a day (QID) | INTRAMUSCULAR | Status: DC | PRN
  Administered 2016-05-31 – 2016-06-01 (×2): 15 mg via INTRAVENOUS
  Filled 2016-05-31 (×2): qty 1

## 2016-05-31 NOTE — Progress Notes (Addendum)
      [address]       [place] [address]             [phone]      Subjective:  No new complaints.  Continues to have pain at chest tube site  Objective: Vital signs in last 24 hours: Temp:  [97.7 F (36.5 C)-98.3 F (36.8 C)] 97.7 F (36.5 C) (01/22 0557) Pulse Rate:  [85-93] 85 (01/22 0557) Cardiac Rhythm: Normal sinus rhythm (01/21 1900) Resp:  [18] 18 (01/22 0557) BP: (102-131)/(67-77) 113/77 (01/22 0557) SpO2:  [98 %-99 %] 99 % (01/22 0557)  Intake/Output from previous day: 01/21 0701 - 01/22 0700 In: 960 [P.O.:960] Out: 60 [Chest Tube:60]  General appearance: alert, cooperative and no distress Heart: regular rate and rhythm Lungs: clear to auscultation bilaterally Abdomen: soft, non-tender; bowel sounds normal; no masses,  no organomegaly Extremities: extremities normal, atraumatic, no cyanosis or edema Wound: clean and dry  Lab Results: No results for input(s): WBC, HGB, HCT, PLT in the last 72 hours. BMET: No results for input(s): NA, K, CL, CO2, GLUCOSE, BUN, CREATININE, CALCIUM in the last 72 hours.  PT/INR: No results for input(s): LABPROT, INR in the last 72 hours. ABG No results found for: PHART, HCO3, TCO2, ACIDBASEDEF, O2SAT CBG (last 3)  No results for input(s): GLUCAP in the last 72 hours.  Assessment/Plan:  1. Recurrent pneumothorax, post chest tube removal- chest tube re-placed Saturday, +4 air leak present, leave on suction 2. Dispo- patient stable, pain controlled with Fentanyl, +4 air leak, leave chest tube to suction today, may ultimately require VATS   LOS: 9 days    Ahmed Prima, ERIN 05/31/2016   Chart reviewed, patient examined, agree with above. Chest tube has small intermittent air leak on suction tonight. Will put to water seal. I have reviewed his CT chest. There are no blebs to staple. There is significant residual  Segmental consolidation and postobstructive pneumonitis in the lower lobe and probably some  radiation injury. I am concerned that the lung would look partially consolidated and inflamed if I did a VATS and I may not be able to do anything to stop the air leak except do a mechanical pleurodesis. It may be best to try to get him home with a mini-express and try to let this resolve itself over the next few weeks.

## 2016-05-31 NOTE — Progress Notes (Signed)
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  Pt continues on a Regular diet. Pt s/p L chest tube placement 1/20. PO intake 100% per flowsheet records. Taking his Ensure Enlive supplements.  Diet Order:  Diet regular Room service appropriate? Yes; Fluid consistency: Thin  Skin:  Reviewed, no issues  Last BM:  1/20  Height:   Ht Readings from Last 1 Encounters:  05/22/16 _0  (1.727 m)    Weight:   Wt Readings from Last 1 Encounters:  05/22/16 143 lb 12.8 oz (65.2 kg)   Wt Readings from Last 20 Encounters:  05/22/16 143 lb 12.8 oz (65.2 kg)  05/21/16 146 lb 12.8 oz (66.6 kg)  05/14/16 151 lb 6.4 oz (68.7 kg)  05/07/16 151 lb 9.6 oz (68.8 kg)  04/30/16 150 lb (68 kg)  04/28/16 150 lb 6.4 oz (68.2 kg)  04/21/16 151 lb 7.3 oz (68.7 kg)  11/03/12 148 lb (67.1 kg)    Ideal Body Weight:  70 kg  BMI:  Body mass index is 21.86 kg/m.  Estimated Nutritional Needs:   Kcal:  2000-2200  Protein:  100-110 gm  Fluid:  2.0-2.2 L  EDUCATION NEEDS:   No education needs identified at this time  Arthur Holms, RD, LDN Pager #: [phone] After-Hours Pager #: [phone]

## 2016-06-01 ENCOUNTER — Encounter (HOSPITAL_COMMUNITY): Payer: Self-pay

## 2016-06-01 ENCOUNTER — Ambulatory Visit: Payer: Self-pay

## 2016-06-01 ENCOUNTER — Ambulatory Visit: Payer: Medicaid Other

## 2016-06-01 ENCOUNTER — Other Ambulatory Visit: Payer: Self-pay

## 2016-06-01 ENCOUNTER — Inpatient Hospital Stay (HOSPITAL_COMMUNITY): Payer: Medicaid Other

## 2016-06-01 ENCOUNTER — Encounter: Payer: Self-pay | Admitting: *Deleted

## 2016-06-01 NOTE — Progress Notes (Signed)
Oncology Nurse Navigator Documentation  Oncology Nurse Navigator Flowsheets 06/01/2016  Navigator Location [facility]  Navigator Encounter Type Other/due to patients hospitalization, his chemo appt need to change. I updated scheduling to call and schedule patient to have labs, follow up with Dr. Julien Nordmann and chemo on 06/15/16  Treatment Phase Treatment  Barriers/Navigation Needs Coordination of Care  Interventions Coordination of Care  Coordination of Care Appts  Acuity Level 2  Acuity Level 2 Assistance expediting appointments  Time Spent with Patient 30

## 2016-06-01 NOTE — Progress Notes (Addendum)
      [address]       [place] [address]             [phone]           Subjective: Patient has pain at chest tube site. He states his aunt died of a heart attack and he wants to attend the funeral which may be tomorrow.  Objective: Vital signs in last 24 hours: Temp:  [97.4 F (36.3 C)-97.7 F (36.5 C)] 97.4 F (36.3 C) (01/23 0548) Pulse Rate:  [72-94] 72 (01/23 0548) Cardiac Rhythm: Normal sinus rhythm (01/23 0700) Resp:  [18] 18 (01/23 0548) BP: (91-123)/(66-71) 123/66 (01/23 0548) SpO2:  [97 %-99 %] 99 % (01/23 0548)      Intake/Output from previous day: 01/22 0701 - 01/23 0700 In: -  Out: 30 [Chest Tube:50]   Physical Exam:  Cardiovascular: RRR Pulmonary: Clear to auscultation on right and diminished at leftt apex Wounds: Dressing is clean and dry.    Lab Results: CBC:No results for input(s): WBC, HGB, HCT, PLT in the last 72 hours. BMET: No results for input(s): NA, K, CL, CO2, GLUCOSE, BUN, CREATININE, CALCIUM in the last 72 hours.  PT/INR: No results for input(s): LABPROT, INR in the last 72 hours. ABG:  INR: Will add last result for INR, ABG once components are confirmed Will add last 4 CBG results once components are confirmed  Assessment/Plan:  1. CV - SR in the 90's 2.  Pulmonary -S/p left chest tube placement yesterday. Chest tube is to suction. There is a persistent air leak. CXR this am appears to show patient is rotated to the right, small left apical pneumothorax and persistent left base consolidation. On room air. Will discuss with Dr. Cyndia Bent if able to place chest tube to a mini express.  ZIMMERMAN,DONIELLE MPA-C 06/01/2016,7:52 AM    Chart reviewed, patient examined, agree with above. Will switch to mini-express this am and check CXR tomorrow am. If lung stays fairly well expanded will plan to get home with that and followup in the office in a week with a CXR.

## 2016-06-01 NOTE — Progress Notes (Signed)
Informed Jennifer, RT on L3 that patient remains hospitalized with chest tube thus, XRT needs to be cancelled for today. Anderson Malta, RT verbalized understanding.

## 2016-06-01 NOTE — Progress Notes (Signed)
Chest tube placed to mini-express, pt tolerated well.  Will continue to monitor.

## 2016-06-02 ENCOUNTER — Inpatient Hospital Stay (HOSPITAL_COMMUNITY): Payer: Medicaid Other

## 2016-06-02 ENCOUNTER — Ambulatory Visit: Payer: Medicaid Other

## 2016-06-02 ENCOUNTER — Telehealth: Payer: Self-pay | Admitting: Radiation Oncology

## 2016-06-02 MED ORDER — ACETAMINOPHEN 325 MG PO TABS: 650.0000 mg | ORAL_TABLET | Freq: Four times a day (QID) | ORAL | Status: DC | PRN

## 2016-06-02 MED ORDER — FENTANYL 50 MCG/HR TD PT72: 50.0000 ug | MEDICATED_PATCH | TRANSDERMAL | 0 refills | Status: DC

## 2016-06-02 MED ORDER — OXYCODONE HCL 15 MG PO TABS: 15.0000 mg | ORAL_TABLET | ORAL | 0 refills | Status: DC | PRN

## 2016-06-02 NOTE — Telephone Encounter (Signed)
Unable to reach patient phone his girlfriend, Almyra Free. Spoke with patient and Almyra Free on speaker phone. Patient confirms he was discharged today from the hospital with his chest tube still in so that he could attend his aunt's funeral. Understands he will have a chest xray in one week. Explains radiation will continue to be on hold until his chest tube is removed. Advised patient to contact this RN if chest tube is removed and not assume I am aware. Patient and girlfriend verbalized understanding of all discussed.

## 2016-06-02 NOTE — Progress Notes (Signed)
      [address]       [place] [address]             [phone]     Subjective:  Douglas Kelly has no new complaints.  He asks how soon I can get him out of here.  Objective: Vital signs in last 24 hours: Temp:  [98 F (36.7 C)-98.2 F (36.8 C)] 98.2 F (36.8 C) (01/24 0444) Pulse Rate:  [78-104] 96 (01/24 0444) Cardiac Rhythm: Normal sinus rhythm (01/23 1900) Resp:  [18] 18 (01/24 0444) BP: (110-121)/(73-77) 121/77 (01/24 0444) SpO2:  [98 %-100 %] 98 % (01/24 0444)  Intake/Output from previous day: 01/23 0701 - 01/24 0700 In: 1600 [P.O.:1600] Out: -   General appearance: alert, cooperative and no distress Heart: regular rate and rhythm Lungs: clear to auscultation bilaterally Abdomen: soft, non-tender; bowel sounds normal; no masses,  no organomegaly Wound: clean and dry  Lab Results: No results for input(s): WBC, HGB, HCT, PLT in the last 72 hours. BMET: No results for input(s): NA, K, CL, CO2, GLUCOSE, BUN, CREATININE, CALCIUM in the last 72 hours.  PT/INR: No results for input(s): LABPROT, INR in the last 72 hours. ABG No results found for: PHART, HCO3, TCO2, ACIDBASEDEF, O2SAT CBG (last 3)  No results for input(s): GLUCAP in the last 72 hours.  Assessment/Plan:  1. Chest tube- on Mini Express, + air leak.. CXR remains stable 2. Dispo- patient stable, will d/c home today with Mini Express in place   LOS: 11 days    Trachelle Low 06/02/2016

## 2016-06-02 NOTE — Care Management Note (Signed)
Case Management Note Marvetta Gibbons RN, BSN Unit 2W-Case Manager [phone]  Patient Details  Name: Douglas Kelly MRN: [id number] Date of Birth: Feb 03, 1971  Subjective/Objective:   Pt admitted with pntx- chest tube placed- hx of lung CA- currently doing chemo                 Action/Plan: PTA pt lived at home- anticipate return home- CM to follow for d/c needs  Expected Discharge Date:                 Expected Discharge Plan:  Home/Self Care  In-House Referral:     Discharge planning Services  CM Consult, Medication Assistance  Post Acute Care Choice:    Choice offered to:     DME Arranged:    DME Agency:     HH Arranged:    [place]:     Status of Service:  Completed, signed off  If discussed at [place] of Stay Meetings, dates discussed:  1/18, 1/23  Additional Comments:  06/02/16- 0930- Marvetta Gibbons RN, CM- pt was discharged this am- with CT to mini express- no HH orders placed plan for pt to f/u in MD office- (pt had funeral to attend out of town).   Dawayne Patricia, RN 06/02/2016, 10:11 AM

## 2016-06-03 ENCOUNTER — Ambulatory Visit: Payer: Medicaid Other

## 2016-06-04 ENCOUNTER — Telehealth: Payer: Self-pay | Admitting: Radiation Oncology

## 2016-06-04 ENCOUNTER — Ambulatory Visit: Payer: Medicaid Other

## 2016-06-04 NOTE — Telephone Encounter (Signed)
Phoned patient's girlfriend, Almyra Free, again. Spoke with patient and girlfriend both over their speaker phone. Explained Dr. Tammi Klippel want to do a CT/SIM with him on Monday, January 29 at 1400 then a radiation treatment immediately following. Patient verbalized understanding and reports he will be here. Also, patient requesting a refill of oxycodone 15 mg. Questioned request since patient received 40 tablets two days ago. Patient reports he is taking the medication every four hours. Explained this could be discussed further on Monday

## 2016-06-04 NOTE — Telephone Encounter (Signed)
Phoned patient's home to inform him of appointments added to his scheduled for Monday, January 29th. Number invalid. Phoned patient's girlfriend, Anselm Pancoast. No answer. Left message stressing the importance of a return phone call.

## 2016-06-07 ENCOUNTER — Other Ambulatory Visit: Payer: Self-pay | Admitting: Radiation Oncology

## 2016-06-07 ENCOUNTER — Ambulatory Visit
Admission: RE | Admit: 2016-06-07 | Discharge: 2016-06-07 | Disposition: A | Discharge status: Home / Self Care | Payer: Medicaid Other | Source: Ambulatory Visit | Attending: Radiation Oncology | Admitting: Radiation Oncology

## 2016-06-07 DIAGNOSIS — C3492 Malignant neoplasm of unspecified part of left bronchus or lung: Secondary | ICD-10-CM

## 2016-06-07 DIAGNOSIS — Z510 Encounter for antineoplastic radiation therapy: Secondary | ICD-10-CM | POA: Diagnosis not present

## 2016-06-07 MED ORDER — OXYCODONE HCL 15 MG PO TABS: 15.0000 mg | ORAL_TABLET | ORAL | 0 refills | Status: DC | PRN

## 2016-06-07 MED FILL — oxyCODONE HCL 15 MG TABS: 15 | 10 days supply | Qty: 60 | Fill #0

## 2016-06-07 NOTE — Progress Notes (Signed)
Patient cmae to nursing to have dressing changed  From his chest tube, ststed"MNO one has chnaged it since my discharge from the hospital'< removed old drssing, dry drainge of old dressings, cleansed around ches tube that is secured with stitch,  With norml saline, slight pink around edge of tubing , placed neosporin around edge, then drain dressings x 4, and mediopre dressing taped and secured leaving blue tatto mark out, patien given written pain medication, he will bring his ID tomorrow, gave name and dob and saw his picture in Epic 3:10 PM

## 2016-06-08 ENCOUNTER — Other Ambulatory Visit (HOSPITAL_BASED_OUTPATIENT_CLINIC_OR_DEPARTMENT_OTHER): Payer: Medicaid Other

## 2016-06-08 ENCOUNTER — Other Ambulatory Visit: Payer: Self-pay | Admitting: Surgery

## 2016-06-08 ENCOUNTER — Encounter: Payer: Self-pay | Admitting: Nurse Practitioner

## 2016-06-08 ENCOUNTER — Ambulatory Visit
Admission: RE | Admit: 2016-06-08 | Discharge: 2016-06-08 | Disposition: A | Discharge status: Home / Self Care | Payer: Medicaid Other | Source: Ambulatory Visit | Attending: Radiation Oncology | Admitting: Radiation Oncology

## 2016-06-08 ENCOUNTER — Ambulatory Visit (HOSPITAL_BASED_OUTPATIENT_CLINIC_OR_DEPARTMENT_OTHER): Payer: Medicaid Other

## 2016-06-08 ENCOUNTER — Ambulatory Visit (HOSPITAL_BASED_OUTPATIENT_CLINIC_OR_DEPARTMENT_OTHER): Payer: Medicaid Other | Admitting: Nurse Practitioner

## 2016-06-08 VITALS — BP 108/70 | HR 77 | Temp 98.7°F | Resp 18

## 2016-06-08 DIAGNOSIS — Z5111 Encounter for antineoplastic chemotherapy: Secondary | ICD-10-CM

## 2016-06-08 DIAGNOSIS — C3492 Malignant neoplasm of unspecified part of left bronchus or lung: Secondary | ICD-10-CM | POA: Diagnosis not present

## 2016-06-08 DIAGNOSIS — C3490 Malignant neoplasm of unspecified part of unspecified bronchus or lung: Secondary | ICD-10-CM

## 2016-06-08 DIAGNOSIS — T7840XA Allergy, unspecified, initial encounter: Secondary | ICD-10-CM | POA: Insufficient documentation

## 2016-06-08 DIAGNOSIS — Z510 Encounter for antineoplastic radiation therapy: Secondary | ICD-10-CM | POA: Diagnosis not present

## 2016-06-08 LAB — COMPREHENSIVE METABOLIC PANEL
ALT: 33 U/L (ref 0–55)
ANION GAP: 10 meq/L (ref 3–11)
AST: 15 U/L (ref 5–34)
Albumin: 3.5 g/dL (ref 3.5–5.0)
Alkaline Phosphatase: 93 U/L (ref 40–150)
BILIRUBIN TOTAL: 0.39 mg/dL (ref 0.20–1.20)
BUN: 12.9 mg/dL (ref 7.0–26.0)
CHLORIDE: 105 meq/L (ref 98–109)
CO2: 23 meq/L (ref 22–29)
Calcium: 9.6 mg/dL (ref 8.4–10.4)
Creatinine: 0.9 mg/dL (ref 0.7–1.3)
Glucose: 119 mg/dl (ref 70–140)
POTASSIUM: 4.1 meq/L (ref 3.5–5.1)
Sodium: 138 mEq/L (ref 136–145)
Total Protein: 7.3 g/dL (ref 6.4–8.3)

## 2016-06-08 LAB — CBC WITH DIFFERENTIAL/PLATELET
BASO%: 1.2 % (ref 0.0–2.0)
Basophils Absolute: 0.1 10*3/uL (ref 0.0–0.1)
EOS ABS: 0.1 10*3/uL (ref 0.0–0.5)
EOS%: 2.6 % (ref 0.0–7.0)
HCT: 37.6 % — ABNORMAL LOW (ref 38.4–49.9)
HGB: 12.9 g/dL — ABNORMAL LOW (ref 13.0–17.1)
LYMPH%: 9 % — AB (ref 14.0–49.0)
MCH: 31.2 pg (ref 27.2–33.4)
MCHC: 34.2 g/dL (ref 32.0–36.0)
MCV: 91.3 fL (ref 79.3–98.0)
MONO#: 0.5 10*3/uL (ref 0.1–0.9)
MONO%: 9.4 % (ref 0.0–14.0)
NEUT#: 4.5 10*3/uL (ref 1.5–6.5)
NEUT%: 77.8 % — AB (ref 39.0–75.0)
PLATELETS: 291 10*3/uL (ref 140–400)
RBC: 4.12 10*6/uL — ABNORMAL LOW (ref 4.20–5.82)
RDW: 16.7 % — ABNORMAL HIGH (ref 11.0–14.6)
WBC: 5.8 10*3/uL (ref 4.0–10.3)
lymph#: 0.5 10*3/uL — ABNORMAL LOW (ref 0.9–3.3)

## 2016-06-08 MED ORDER — SODIUM CHLORIDE 0.9 % IV SOLN
20.0000 mg | Freq: Once | INTRAVENOUS | Status: AC
  Administered 2016-06-08: 20 mg via INTRAVENOUS
  Filled 2016-06-08: qty 2

## 2016-06-08 MED ORDER — PALONOSETRON HCL INJECTION 0.25 MG/5ML
INTRAVENOUS | Status: AC
  Filled 2016-06-08: qty 5

## 2016-06-08 MED ORDER — DIPHENHYDRAMINE HCL 50 MG/ML IJ SOLN
50.0000 mg | Freq: Once | INTRAMUSCULAR | Status: AC
  Administered 2016-06-08: 50 mg via INTRAVENOUS

## 2016-06-08 MED ORDER — SODIUM CHLORIDE 0.9 % IV SOLN
240.0000 mg | Freq: Once | INTRAVENOUS | Status: AC
  Administered 2016-06-08: 240 mg via INTRAVENOUS
  Filled 2016-06-08: qty 24

## 2016-06-08 MED ORDER — DIPHENHYDRAMINE HCL 50 MG/ML IJ SOLN
INTRAMUSCULAR | Status: AC
  Filled 2016-06-08: qty 1

## 2016-06-08 MED ORDER — METHYLPREDNISOLONE SODIUM SUCC 125 MG IJ SOLR
125.0000 mg | Freq: Once | INTRAMUSCULAR | Status: AC | PRN
  Administered 2016-06-08: 125 mg via INTRAVENOUS

## 2016-06-08 MED ORDER — PACLITAXEL CHEMO INJECTION 300 MG/50ML
45.0000 mg/m2 | Freq: Once | INTRAVENOUS | Status: AC
  Administered 2016-06-08: 84 mg via INTRAVENOUS
  Filled 2016-06-08: qty 14

## 2016-06-08 MED ORDER — FAMOTIDINE IN NACL 20-0.9 MG/50ML-% IV SOLN
20.0000 mg | Freq: Once | INTRAVENOUS | Status: AC
  Administered 2016-06-08: 20 mg via INTRAVENOUS

## 2016-06-08 MED ORDER — FAMOTIDINE IN NACL 20-0.9 MG/50ML-% IV SOLN
INTRAVENOUS | Status: AC
  Filled 2016-06-08: qty 50

## 2016-06-08 MED ORDER — SODIUM CHLORIDE 0.9 % IV SOLN
Freq: Once | INTRAVENOUS | Status: AC
Start: 2016-06-08 — End: 2016-06-08
  Administered 2016-06-08: 12:00:00 via INTRAVENOUS

## 2016-06-08 MED ORDER — PALONOSETRON HCL INJECTION 0.25 MG/5ML
0.2500 mg | Freq: Once | INTRAVENOUS | Status: AC
  Administered 2016-06-08: 0.25 mg via INTRAVENOUS

## 2016-06-08 NOTE — Progress Notes (Signed)
SYMPTOM MANAGEMENT CLINIC    Chief Complaint: Hypersensitivity reaction  HPI:  Douglas Kelly 46 y.o. male diagnosed with lung cancer.  Currently undergoing Taxol/carboplatin chemotherapy regimen and radiation treatments.    No history exists.    Review of Systems  Respiratory: Positive for shortness of breath.   Gastrointestinal: Positive for nausea.  All other systems reviewed and are negative.   Past Medical History:  Diagnosis Date  . Encounter for antineoplastic chemotherapy 05/14/2016  . Pneumonia     Past Surgical History:  Procedure Laterality Date  . VIDEO BRONCHOSCOPY Bilateral 04/19/2016   Procedure: VIDEO BRONCHOSCOPY WITHOUT FLUORO;  Surgeon: Juanito Doom, MD;  Location: [facility];  Service: Cardiopulmonary;  Laterality: Bilateral;    has Postobstructive pneumonia; Normocytic anemia; LFTs abnormal; Smoker; Collapse of left lung, hx of pneumonia 5 months ago; Pneumonia of left upper lobe due to infectious organism [facility]); Protein-calorie malnutrition, severe; Stage III squamous cell carcinoma of left lung ([place]); Encounter for antineoplastic chemotherapy; Pneumothorax; and Hypersensitivity reaction on his problem list.    has No Known Allergies.  Allergies as of 06/08/2016   No Known Allergies     Medication List       Accurate as of 06/08/16  1:51 PM. Always use your most recent med list.          acetaminophen 325 MG tablet Commonly known as:  TYLENOL Take 2 tablets (650 mg total) by mouth every 6 (six) hours as needed for mild pain (or Fever >/= 101).   albuterol-ipratropium 18-103 MCG/ACT inhaler Commonly known as:  COMBIVENT Inhale 1 puff into the lungs every 4 (four) hours as needed for wheezing or shortness of breath.   bisacodyl 5 MG EC tablet Commonly known as:  bisacodyl Take 1 tablet (5 mg total) by mouth daily as needed for moderate constipation.   chlorpheniramine-HYDROcodone 10-8 MG/5ML Suer Commonly known as:  TUSSIONEX Take 5  mLs by mouth every 12 (twelve) hours as needed for cough.   cyanocobalamin 1000 MCG tablet Take 1 tablet (1,000 mcg total) by mouth daily.   fentaNYL 50 MCG/HR Commonly known as:  DURAGESIC Place 1 patch (50 mcg total) onto the skin every 3 (three) days.   hyaluronate sodium Gel Apply 1 application topically 2 (two) times daily.   nicotine 21 mg/24hr patch Commonly known as:  NICODERM CQ - dosed in mg/24 hours Place 1 patch (21 mg total) onto the skin daily.   oxyCODONE 15 MG immediate release tablet Commonly known as:  ROXICODONE Take 1 tablet (15 mg total) by mouth every 4 (four) hours as needed.   prochlorperazine 10 MG tablet Commonly known as:  COMPAZINE Take 1 tablet (10 mg total) by mouth every 6 (six) hours as needed for nausea or vomiting.        PHYSICAL EXAMINATION  Oncology Vitals 06/08/2016 06/08/2016  Height - -  Weight - -  Weight (lbs) - -  BMI (kg/m2) - -  Temp - -  Pulse 77 75  Resp - -  SpO2 94 94  BSA (m2) - -   BP Readings from Last 2 Encounters:  06/08/16 108/70  06/02/16 121/77    Physical Exam  Constitutional: He is oriented to person, place, and time. He appears malnourished. He appears unhealthy. He appears cachectic.  HENT:  Head: Normocephalic and atraumatic.  Mouth/Throat: Oropharynx is clear and moist.  Eyes: Conjunctivae and EOM are normal. Pupils are equal, round, and reactive to light. Right eye exhibits no discharge.  Left eye exhibits no discharge. No scleral icterus.  Neck: Normal range of motion. Neck supple. No JVD present. No tracheal deviation present. No thyromegaly present.  Cardiovascular: Normal rate, regular rhythm, normal heart sounds and intact distal pulses.   Pulmonary/Chest: Effort normal and breath sounds normal. No respiratory distress. He has no wheezes. He has no rales. He exhibits no tenderness.  Patient has a left chest tube intact.  Breath sounds clear bilaterally.  No shortness of breath on exam.  Abdominal:  Soft. Bowel sounds are normal. He exhibits no distension and no mass. There is no tenderness. There is no rebound and no guarding.  Musculoskeletal: Normal range of motion. He exhibits no edema, tenderness or deformity.  Lymphadenopathy:    He has no cervical adenopathy.  Neurological: He is alert and oriented to person, place, and time. Gait normal.  Skin: Skin is warm and dry. No rash noted. No erythema. No pallor.  Psychiatric: Affect normal.  Nursing note and vitals reviewed.   LABORATORY DATA:. Appointment on 06/08/2016  Component Date Value Ref Range Status  . WBC 06/08/2016 5.8  4.0 - 10.3 10e3/uL Final  . NEUT# 06/08/2016 4.5  1.5 - 6.5 10e3/uL Final  . HGB 06/08/2016 12.9* 13.0 - 17.1 g/dL Final  . HCT 06/08/2016 37.6* 38.4 - 49.9 % Final  . Platelets 06/08/2016 291  140 - 400 10e3/uL Final  . MCV 06/08/2016 91.3  79.3 - 98.0 fL Final  . MCH 06/08/2016 31.2  27.2 - 33.4 pg Final  . MCHC 06/08/2016 34.2  32.0 - 36.0 g/dL Final  . RBC 06/08/2016 4.12* 4.20 - 5.82 10e6/uL Final  . RDW 06/08/2016 16.7* 11.0 - 14.6 % Final  . lymph# 06/08/2016 0.5* 0.9 - 3.3 10e3/uL Final  . MONO# 06/08/2016 0.5  0.1 - 0.9 10e3/uL Final  . Eosinophils Absolute 06/08/2016 0.1  0.0 - 0.5 10e3/uL Final  . Basophils Absolute 06/08/2016 0.1  0.0 - 0.1 10e3/uL Final  . NEUT% 06/08/2016 77.8* 39.0 - 75.0 % Final  . LYMPH% 06/08/2016 9.0* 14.0 - 49.0 % Final  . MONO% 06/08/2016 9.4  0.0 - 14.0 % Final  . EOS% 06/08/2016 2.6  0.0 - 7.0 % Final  . BASO% 06/08/2016 1.2  0.0 - 2.0 % Final  . Sodium 06/08/2016 138  136 - 145 mEq/L Final  . Potassium 06/08/2016 4.1  3.5 - 5.1 mEq/L Final  . Chloride 06/08/2016 105  98 - 109 mEq/L Final  . CO2 06/08/2016 23  22 - 29 mEq/L Final  . Glucose 06/08/2016 119  70 - 140 mg/dl Final  . BUN 06/08/2016 12.9  7.0 - 26.0 mg/dL Final  . Creatinine 06/08/2016 0.9  0.7 - 1.3 mg/dL Final  . Total Bilirubin 06/08/2016 0.39  0.20 - 1.20 mg/dL Final  . Alkaline Phosphatase  06/08/2016 93  40 - 150 U/L Final  . AST 06/08/2016 15  5 - 34 U/L Final  . ALT 06/08/2016 33  0 - 55 U/L Final  . Total Protein 06/08/2016 7.3  6.4 - 8.3 g/dL Final  . Albumin 06/08/2016 3.5  3.5 - 5.0 g/dL Final  . Calcium 06/08/2016 9.6  8.4 - 10.4 mg/dL Final  . Anion Gap 06/08/2016 10  3 - 11 mEq/L Final  . EGFR 06/08/2016 >90  >90 ml/min/1.73 m2 Final    RADIOGRAPHIC STUDIES: No results found.  ASSESSMENT/PLAN:    Stage III squamous cell carcinoma of left lung [facility]) Patient presented to the [place] today to receive cycle  2 of his Taxol/carboplatin chemotherapy regimen.  He also continues with daily radiation treatments as prescribed.  See further notes for details of today's visit.  Patient is scheduled to meet with Dr. Cyndia Bent, pulmonologist, on 06/09/2016.  He is scheduled to return on 07/13/2016 for labs, visit, and chemotherapy.  Hypersensitivity reaction Patient presented to the [place] today to receive cycle 2 of his Taxol/carboplatin chemotherapy regimen.  Patient developed a hypersensitivity reaction in the midst of the Taxol infusion, which consisted of some mild shortness of breath, nausea, and flushing.  The infusion was held; and patient was given Solu-Medrol 125 mg IV per protocol.  Confirmed patient was given Benadryl 50 mg and Pepcid as premedications.  All symptoms resolved; and patient was able to resume the Taxol infusion as previously directed.   Patient stated understanding of all instructions; and was in agreement with this plan of care. The patient knows to call the clinic with any problems, questions or concerns.   Total time spent with patient was 15 minutes;  with greater than 75 percent of that time spent in face to face counseling regarding patient's symptoms,  and coordination of care and follow up.  Disclaimer:This dictation was prepared with Dragon/digital dictation along with [employer]. Any transcriptional errors that result  from this process are unintentional.  Drue Second, NP 06/08/2016

## 2016-06-08 NOTE — Progress Notes (Signed)
  Radiation Oncology         [phone] ________________________________  Name: Douglas Kelly MRN: [id number]  Date: 06/07/2016  DOB: [birth year]  SIMULATION AND TREATMENT PLANNING NOTE    ICD-9-CM ICD-10-CM   1. 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  Previous attempt at re-sim revealed a pneumothorax, so, the sim was cancelled and chest tube was placed.  Re-sim is needed due to tumor regression.  Repeat imaging is performed today.  SPECIAL TREATMENT PROCEDURE:  3D Simulation  I have requested a DVH of the following structures: left lung, right lung, heart, spinal cord and target..    ________________________________  Sheral Apley Tammi Klippel, M.D.

## 2016-06-08 NOTE — Assessment & Plan Note (Signed)
Patient presented to the [place] today to receive cycle 2 of his Taxol/carboplatin chemotherapy regimen.  He also continues with daily radiation treatments as prescribed.  See further notes for details of today's visit.  Patient is scheduled to meet with Dr. Cyndia Bent, pulmonologist, on 06/09/2016.  He is scheduled to return on 07/13/2016 for labs, visit, and chemotherapy.

## 2016-06-08 NOTE — Progress Notes (Signed)
@   1300 patient receiving Taxol, experienced facial flushing, nausea, dizziness, and shortness of breath. Infusion of Taxol was immediately stopped, normal saline was run in IV at 999, Retta Mac NP was notified and came to infusion room to assess patient. Hypersensitivity protocol initiated. Medications given as documented in [facility]. Retta Mac NP ordered for Taxol to be restarted '@1328'$  at rate of 231m/hr. Patient was monitored for rest of infusion, and vitals taken. Patient completed infusion without complications.

## 2016-06-08 NOTE — Progress Notes (Signed)
Counseling intern checked in with patient and girlfriend to offer support. Patient was undergoing infusion treatment and was groggy for most of the conversation. Both indicated that getting through treatment is taxing, especially the daily trips to the hospital, and requires a great deal of patience. They feel supported by visits from family and friends on weekends.   Counseling intern will continue to check in with the couple as schedules permit.  Lamount Cohen, Counseling Intern Supervisor - Scientist, research (physical sciences)

## 2016-06-08 NOTE — Patient Instructions (Signed)
[facility] Discharge Instructions for Patients Receiving Chemotherapy  Today you received the following chemotherapy agents Taxol and Carboplatin   To help prevent nausea and vomiting after your treatment, we encourage you to take your nausea medication as directed. No Zofran for 3 days.Take Compazine instead.    If you develop nausea and vomiting that is not controlled by your nausea medication, call the clinic.   BELOW ARE SYMPTOMS THAT SHOULD BE REPORTED IMMEDIATELY:  *FEVER GREATER THAN 100.5 F  *CHILLS WITH OR WITHOUT FEVER  NAUSEA AND VOMITING THAT IS NOT CONTROLLED WITH YOUR NAUSEA MEDICATION  *UNUSUAL SHORTNESS OF BREATH  *UNUSUAL BRUISING OR BLEEDING  TENDERNESS IN MOUTH AND THROAT WITH OR WITHOUT PRESENCE OF ULCERS  *URINARY PROBLEMS  *BOWEL PROBLEMS  UNUSUAL RASH Items with * indicate a potential emergency and should be followed up as soon as possible.  Feel free to call the clinic you have any questions or concerns. The clinic phone number is [phone].  Please show the [facility] at check-in to the Emergency Department and triage nurse.  Paclitaxel injection What is this medicine? PACLITAXEL (PAK li TAX el) is a chemotherapy drug. It targets fast dividing cells, like cancer cells, and causes these cells to die. This medicine is used to treat ovarian cancer, breast cancer, and other cancers. This medicine may be used for other purposes; ask your health care provider or pharmacist if you have questions. COMMON BRAND NAME(S): Onxol, Taxol What should I tell my health care provider before I take this medicine? They need to know if you have any of these conditions: -blood disorders -irregular heartbeat -infection (especially a virus infection such as chickenpox, cold sores, or herpes) -liver disease -previous or ongoing radiation therapy -an unusual or allergic reaction to paclitaxel, alcohol, polyoxyethylated castor oil, other  chemotherapy agents, other medicines, foods, dyes, or preservatives -pregnant or trying to get pregnant -breast-feeding How should I use this medicine? This drug is given as an infusion into a vein. It is administered in a hospital or clinic by a specially trained health care professional. Talk to your pediatrician regarding the use of this medicine in children. Special care may be needed. Overdosage: If you think you have taken too much of this medicine contact a poison control center or emergency room at once. NOTE: This medicine is only for you. Do not share this medicine with others. What if I miss a dose? It is important not to miss your dose. Call your doctor or health care professional if you are unable to keep an appointment. What may interact with this medicine? Do not take this medicine with any of the following medications: -disulfiram -metronidazole This medicine may also interact with the following medications: -cyclosporine -diazepam -ketoconazole -medicines to increase blood counts like filgrastim, pegfilgrastim, sargramostim -other chemotherapy drugs like cisplatin, doxorubicin, epirubicin, etoposide, teniposide, vincristine -quinidine -testosterone -vaccines -verapamil Talk to your doctor or health care professional before taking any of these medicines: -acetaminophen -aspirin -ibuprofen -ketoprofen -naproxen This list may not describe all possible interactions. Give your health care provider a list of all the medicines, herbs, non-prescription drugs, or dietary supplements you use. Also tell them if you smoke, drink alcohol, or use illegal drugs. Some items may interact with your medicine. What should I watch for while using this medicine? Your condition will be monitored carefully while you are receiving this medicine. You will need important blood work done while you are taking this medicine. This medicine can cause serious allergic  reactions. To reduce your risk  you will need to take other medicine(s) before treatment with this medicine. If you experience allergic reactions like skin rash, itching or hives, swelling of the face, lips, or tongue, tell your doctor or health care professional right away. In some cases, you may be given additional medicines to help with side effects. Follow all directions for their use. This drug may make you feel generally unwell. This is not uncommon, as chemotherapy can affect healthy cells as well as cancer cells. Report any side effects. Continue your course of treatment even though you feel ill unless your doctor tells you to stop. Call your doctor or health care professional for advice if you get a fever, chills or sore throat, or other symptoms of a cold or flu. Do not treat yourself. This drug decreases your body's ability to fight infections. Try to avoid being around people who are sick. This medicine may increase your risk to bruise or bleed. Call your doctor or health care professional if you notice any unusual bleeding. Be careful brushing and flossing your teeth or using a toothpick because you may get an infection or bleed more easily. If you have any dental work done, tell your dentist you are receiving this medicine. Avoid taking products that contain aspirin, acetaminophen, ibuprofen, naproxen, or ketoprofen unless instructed by your doctor. These medicines may hide a fever. Do not become pregnant while taking this medicine. Women should inform their doctor if they wish to become pregnant or think they might be pregnant. There is a potential for serious side effects to an unborn child. Talk to your health care professional or pharmacist for more information. Do not breast-feed an infant while taking this medicine. Men are advised not to father a child while receiving this medicine. This product may contain alcohol. Ask your pharmacist or healthcare provider if this medicine contains alcohol. Be sure to tell all  healthcare providers you are taking this medicine. Certain medicines, like metronidazole and disulfiram, can cause an unpleasant reaction when taken with alcohol. The reaction includes flushing, headache, nausea, vomiting, sweating, and increased thirst. The reaction can last from 30 minutes to several hours. What side effects may I notice from receiving this medicine? Side effects that you should report to your doctor or health care professional as soon as possible: -allergic reactions like skin rash, itching or hives, swelling of the face, lips, or tongue -low blood counts - This drug may decrease the number of white blood cells, red blood cells and platelets. You may be at increased risk for infections and bleeding. -signs of infection - fever or chills, cough, sore throat, pain or difficulty passing urine -signs of decreased platelets or bleeding - bruising, pinpoint red spots on the skin, black, tarry stools, nosebleeds -signs of decreased red blood cells - unusually weak or tired, fainting spells, lightheadedness -breathing problems -chest pain -high or low blood pressure -mouth sores -nausea and vomiting -pain, swelling, redness or irritation at the injection site -pain, tingling, numbness in the hands or feet -slow or irregular heartbeat -swelling of the ankle, feet, hands Side effects that usually do not require medical attention (report to your doctor or health care professional if they continue or are bothersome): -bone pain -complete hair loss including hair on your head, underarms, pubic hair, eyebrows, and eyelashes -changes in the color of fingernails -diarrhea -loosening of the fingernails -loss of appetite -muscle or joint pain -red flush to skin -sweating This list may not describe all possible  side effects. Call your doctor for medical advice about side effects. You may report side effects to FDA at 1-800-FDA-1088. Where should I keep my medicine? This drug is given in  a hospital or clinic and will not be stored at home. NOTE: This sheet is a summary. It may not cover all possible information. If you have questions about this medicine, talk to your doctor, pharmacist, or health care provider.  2017 Elsevier/Gold Standard (2015-02-25 19:58:00)  Carboplatin injection What is this medicine? CARBOPLATIN (KAR boe pla tin) is a chemotherapy drug. It targets fast dividing cells, like cancer cells, and causes these cells to die. This medicine is used to treat ovarian cancer and many other cancers. This medicine may be used for other purposes; ask your health care provider or pharmacist if you have questions. COMMON BRAND NAME(S): Paraplatin What should I tell my health care provider before I take this medicine? They need to know if you have any of these conditions: -blood disorders -hearing problems -kidney disease -recent or ongoing radiation therapy -an unusual or allergic reaction to carboplatin, cisplatin, other chemotherapy, other medicines, foods, dyes, or preservatives -pregnant or trying to get pregnant -breast-feeding How should I use this medicine? This drug is usually given as an infusion into a vein. It is administered in a hospital or clinic by a specially trained health care professional. Talk to your pediatrician regarding the use of this medicine in children. Special care may be needed. Overdosage: If you think you have taken too much of this medicine contact a poison control center or emergency room at once. NOTE: This medicine is only for you. Do not share this medicine with others. What if I miss a dose? It is important not to miss a dose. Call your doctor or health care professional if you are unable to keep an appointment. What may interact with this medicine? -medicines for seizures -medicines to increase blood counts like filgrastim, pegfilgrastim, sargramostim -some antibiotics like amikacin, gentamicin, neomycin, streptomycin,  tobramycin -vaccines Talk to your doctor or health care professional before taking any of these medicines: -acetaminophen -aspirin -ibuprofen -ketoprofen -naproxen This list may not describe all possible interactions. Give your health care provider a list of all the medicines, herbs, non-prescription drugs, or dietary supplements you use. Also tell them if you smoke, drink alcohol, or use illegal drugs. Some items may interact with your medicine. What should I watch for while using this medicine? Your condition will be monitored carefully while you are receiving this medicine. You will need important blood work done while you are taking this medicine. This drug may make you feel generally unwell. This is not uncommon, as chemotherapy can affect healthy cells as well as cancer cells. Report any side effects. Continue your course of treatment even though you feel ill unless your doctor tells you to stop. In some cases, you may be given additional medicines to help with side effects. Follow all directions for their use. Call your doctor or health care professional for advice if you get a fever, chills or sore throat, or other symptoms of a cold or flu. Do not treat yourself. This drug decreases your body's ability to fight infections. Try to avoid being around people who are sick. This medicine may increase your risk to bruise or bleed. Call your doctor or health care professional if you notice any unusual bleeding. Be careful brushing and flossing your teeth or using a toothpick because you may get an infection or bleed more easily. If you  have any dental work done, tell your dentist you are receiving this medicine. Avoid taking products that contain aspirin, acetaminophen, ibuprofen, naproxen, or ketoprofen unless instructed by your doctor. These medicines may hide a fever. Do not become pregnant while taking this medicine. Women should inform their doctor if they wish to become pregnant or think  they might be pregnant. There is a potential for serious side effects to an unborn child. Talk to your health care professional or pharmacist for more information. Do not breast-feed an infant while taking this medicine. What side effects may I notice from receiving this medicine? Side effects that you should report to your doctor or health care professional as soon as possible: -allergic reactions like skin rash, itching or hives, swelling of the face, lips, or tongue -signs of infection - fever or chills, cough, sore throat, pain or difficulty passing urine -signs of decreased platelets or bleeding - bruising, pinpoint red spots on the skin, black, tarry stools, nosebleeds -signs of decreased red blood cells - unusually weak or tired, fainting spells, lightheadedness -breathing problems -changes in hearing -changes in vision -chest pain -high blood pressure -low blood counts - This drug may decrease the number of white blood cells, red blood cells and platelets. You may be at increased risk for infections and bleeding. -nausea and vomiting -pain, swelling, redness or irritation at the injection site -pain, tingling, numbness in the hands or feet -problems with balance, talking, walking -trouble passing urine or change in the amount of urine Side effects that usually do not require medical attention (report to your doctor or health care professional if they continue or are bothersome): -hair loss -loss of appetite -metallic taste in the mouth or changes in taste This list may not describe all possible side effects. Call your doctor for medical advice about side effects. You may report side effects to FDA at 1-800-FDA-1088. Where should I keep my medicine? This drug is given in a hospital or clinic and will not be stored at home. NOTE: This sheet is a summary. It may not cover all possible information. If you have questions about this medicine, talk to your doctor, pharmacist, or health care  provider.  2017 Elsevier/Gold Standard (2007-08-01 14:38:05)

## 2016-06-08 NOTE — Assessment & Plan Note (Signed)
Patient presented to the [facility] today to receive cycle 2 of his Taxol/carboplatin chemotherapy regimen.  Patient developed a hypersensitivity reaction in the midst of the Taxol infusion, which consisted of some mild shortness of breath, nausea, and flushing.  The infusion was held; and patient was given Solu-Medrol 125 mg IV per protocol.  Confirmed patient was given Benadryl 50 mg and Pepcid as premedications.  All symptoms resolved; and patient was able to resume the Taxol infusion as previously directed.

## 2016-06-09 ENCOUNTER — Ambulatory Visit: Payer: Medicaid Other

## 2016-06-09 ENCOUNTER — Encounter: Payer: Self-pay | Admitting: Surgery

## 2016-06-09 ENCOUNTER — Ambulatory Visit: Payer: Medicaid Other | Admitting: Radiation Oncology

## 2016-06-09 ENCOUNTER — Ambulatory Visit
Admission: RE | Admit: 2016-06-09 | Discharge: 2016-06-09 | Disposition: A | Discharge status: Home / Self Care | Payer: Medicaid Other | Source: Ambulatory Visit | Attending: Radiation Oncology | Admitting: Radiation Oncology

## 2016-06-09 ENCOUNTER — Ambulatory Visit
Admission: RE | Admit: 2016-06-09 | Discharge: 2016-06-09 | Disposition: A | Discharge status: Home / Self Care | Payer: No Typology Code available for payment source | Source: Ambulatory Visit | Attending: Surgery | Admitting: Surgery

## 2016-06-09 ENCOUNTER — Ambulatory Visit (INDEPENDENT_AMBULATORY_CARE_PROVIDER_SITE_OTHER): Payer: Medicaid Other | Admitting: Surgery

## 2016-06-09 VITALS — BP 117/76 | HR 71 | Resp 16 | Ht 68.0 in | Wt 158.4 lb

## 2016-06-09 DIAGNOSIS — J9382 Other air leak: Secondary | ICD-10-CM

## 2016-06-09 DIAGNOSIS — Z978 Presence of other specified devices: Secondary | ICD-10-CM | POA: Diagnosis not present

## 2016-06-09 DIAGNOSIS — Z510 Encounter for antineoplastic radiation therapy: Secondary | ICD-10-CM | POA: Diagnosis not present

## 2016-06-09 DIAGNOSIS — J939 Pneumothorax, unspecified: Secondary | ICD-10-CM | POA: Diagnosis not present

## 2016-06-09 DIAGNOSIS — Z9689 Presence of other specified functional implants: Secondary | ICD-10-CM

## 2016-06-09 DIAGNOSIS — C3490 Malignant neoplasm of unspecified part of unspecified bronchus or lung: Secondary | ICD-10-CM

## 2016-06-09 DIAGNOSIS — IMO0002 Reserved for concepts with insufficient information to code with codable children: Secondary | ICD-10-CM

## 2016-06-09 NOTE — Progress Notes (Signed)
HPI:  [name] returns today for follow up of his left chest tube placed for recurrent large left pneumothorax in the setting of treatment for advanced lung cancer. He had a tube placed on admission and the pneumothorax and air leak resolved. After the tube was removed he developed a recurrent large left pneumothorax the next day and had to have another chest tube. He still had some air leak and we decided to send him home with the tube attached to a mini-express. He has been doing well and resumed chemo and XRT this week. He says that his breathing has been good.  Current Outpatient Prescriptions  Medication Sig Dispense Refill  . acetaminophen (TYLENOL) 325 MG tablet Take 2 tablets (650 mg total) by mouth every 6 (six) hours as needed for mild pain (or Fever >/= 101).    Marland Kitchen albuterol-ipratropium (COMBIVENT) 18-103 MCG/ACT inhaler Inhale 1 puff into the lungs every 4 (four) hours as needed for wheezing or shortness of breath. 1 Inhaler 0  . bisacodyl (BISACODYL) 5 MG EC tablet Take 1 tablet (5 mg total) by mouth daily as needed for moderate constipation. 30 tablet 0  . chlorpheniramine-HYDROcodone (TUSSIONEX) 10-8 MG/5ML SUER Take 5 mLs by mouth every 12 (twelve) hours as needed for cough. 140 mL 0  . fentaNYL (DURAGESIC) 50 MCG/HR Place 1 patch (50 mcg total) onto the skin every 3 (three) days. 5 patch 0  . hyaluronate sodium (RADIAPLEXRX) GEL Apply 1 application topically 2 (two) times daily.    Marland Kitchen oxyCODONE (ROXICODONE) 15 MG immediate release tablet Take 1 tablet (15 mg total) by mouth every 4 (four) hours as needed. 60 tablet 0  . prochlorperazine (COMPAZINE) 10 MG tablet Take 1 tablet (10 mg total) by mouth every 6 (six) hours as needed for nausea or vomiting. 30 tablet 0  . vitamin B-12 1000 MCG tablet Take 1 tablet (1,000 mcg total) by mouth daily. 30 tablet 0  . nicotine (NICODERM CQ - DOSED IN MG/24 HOURS) 21 mg/24hr patch Place 1 patch (21 mg total) onto the skin daily. (Patient not  taking: Reported on 06/09/2016) 28 patch 0   No current facility-administered medications for this visit.      Physical Exam: BP 117/76 (BP Location: Right Arm, Patient Position: Sitting, Cuff Size: Large)   Pulse 71   Resp 16 Comment: RA  Ht '5\' 8"'$  (1.727 m)   Wt 158 lb 6.4 oz (71.8 kg)   SpO2 97% Comment: ON RA  BMI 24.08 kg/m  He looks comfortable Lungs are clear The left chest tube is in place with slight redness around the tube site. The first chest tube suture was removed. There is no air leak from the chest tube.  Diagnostic Tests:    CLINICAL DATA:  Pneumothorax.  EXAM: CHEST  2 VIEW  COMPARISON:  Radiographs of June 02, 2016.  FINDINGS: Stable cardiomediastinal silhouette. Left-sided chest tube is unchanged in position. Minimal left apical pneumothorax is noted which is slightly decreased compared to prior exam stable consolidation or atelectasis is noted in the left lower lobe. No significant pleural effusion is noted. Hyperexpansion of the right lung is again noted, with mediastinal shift to the left. Bony thorax is unremarkable.  IMPRESSION: Left-sided chest tube is unchanged in position with minimal left apical pneumothorax, which is slightly decreased compared to prior exam. Stable left lower lobe consolidation or atelectasis is noted. Stable hyperexpansion of the right lung with mediastinal shift to the left.   Electronically Signed  By: Marijo Conception, M.D.   On: 06/09/2016 10:05      Impression:  His CXR looks good and there is no significant ptx. I do not see any air leak but there is still tidaling with coughing. I think we should keep the tube in for another week and if things are stable next week will plan to remove the tube.  Plan:  He will return to see me in one week with a CXR.   Gaye Pollack, MD [facility] [phone]

## 2016-06-10 ENCOUNTER — Ambulatory Visit: Payer: Medicaid Other

## 2016-06-11 ENCOUNTER — Ambulatory Visit
Admission: RE | Admit: 2016-06-11 | Discharge: 2016-06-11 | Disposition: A | Discharge status: Home / Self Care | Payer: Medicaid Other | Source: Ambulatory Visit | Attending: Radiation Oncology | Admitting: Radiation Oncology

## 2016-06-11 ENCOUNTER — Ambulatory Visit: Payer: Medicaid Other

## 2016-06-11 VITALS — BP 108/80 | HR 93 | Temp 98.0°F | Resp 16 | Wt 154.0 lb

## 2016-06-11 DIAGNOSIS — C3492 Malignant neoplasm of unspecified part of left bronchus or lung: Secondary | ICD-10-CM | POA: Insufficient documentation

## 2016-06-11 DIAGNOSIS — Z923 Personal history of irradiation: Secondary | ICD-10-CM | POA: Diagnosis not present

## 2016-06-11 DIAGNOSIS — Z510 Encounter for antineoplastic radiation therapy: Secondary | ICD-10-CM | POA: Diagnosis not present

## 2016-06-11 MED ORDER — RADIAPLEXRX EX GEL
Freq: Once | CUTANEOUS | Status: AC
  Administered 2016-06-11: 17:00:00 via TOPICAL

## 2016-06-11 MED ORDER — OXYCODONE HCL 15 MG PO TABS: 15.0000 mg | ORAL_TABLET | ORAL | 0 refills | Status: DC | PRN

## 2016-06-11 MED FILL — oxyCODONE HCL 15 MG TABS: 15 | 10 days supply | Qty: 60 | Fill #0

## 2016-06-11 NOTE — Progress Notes (Signed)
  Radiation Oncology         [phone]) [phone]   Name: Douglas Kelly MRN: [id number]   Date: 06/11/2016  DOB: 02/24/1971     Weekly Radiation Therapy Management    ICD-9-CM ICD-10-CM   1. 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  Weight and vitals stable. Reports middle back and left side pain at chest tube insertion site 7 on a scale of 0-10. Patient requesting refills of all pain medication. Reports he is unsure if a visitor in his home took his medications or if they were in his recliner when he threw it out to make room for his new recliner. He has been taking ibuprofen but, his pain is poorly managed. Reports left chest tube dressing. Redness noted at insertion site of tube without edema or fever. Reports a dry cough. Reports SOB is unchanged. Reports difficult painful swallowing has resolved. Reports fatigue. Sees Dr. Julien Nordmann Tuesday and has Chest X-ray on Wednesday.     Set-up films were reviewed. The chart was checked.  Physical Findings  weight is 154 lb (69.9 kg). His oral temperature is 98 F (36.7 C). His blood pressure is 108/80 and his pulse is 93. His respiration is 16 and oxygen saturation is 97%. . Weight essentially stable. Alert and in no acute distress. Chest tube in place.   Impression The patient is tolerating radiation.  Plan Continue treatment as planned. He is scheduled to see Dr. Julien Nordmann on 06/15/2016 and Dr. Cyndia Bent on 06/16/2016.  Refilled oxycodone today for pain management.     Sheral Apley Tammi Klippel, M.D.  This document serves as a record of services personally performed by [name], MD. It was created on his behalf by Arlyce Harman, a trained medical scribe. The creation of this record is based on the scribe's personal observations and the provider's statements to  them. This document has been checked and approved by the attending provider.

## 2016-06-11 NOTE — Progress Notes (Signed)
Weight and vitals stable. Reports middle back and left side pain at chest tube insertion site 7 on a scale of 0-10. Patient requesting refills of all pain medication. Reports he is unsure if a visitor in his home took his medications or if they were in his recliner when he threw it out to make room for his new recliner. Reports left chest tube dressing. Redness noted at insertion site of tube without edema or fever. Reports a dry cough. Reports SOB is unchanged. Reports difficult painful swallowing has resolved. Reports fatigue. Sees Mohamed Tuesday and has chest xray on Wednesday.   BP 108/80 (BP Location: Left Arm, Patient Position: Sitting, Cuff Size: Normal)   Pulse 93   Temp 98 F (36.7 C) (Oral)   Resp 16   Wt 154 lb (69.9 kg)   SpO2 97%   BMI 23.42 kg/m  Wt Readings from Last 3 Encounters:  06/11/16 154 lb (69.9 kg)  06/09/16 158 lb 6.4 oz (71.8 kg)  05/22/16 143 lb 12.8 oz (65.2 kg)

## 2016-06-11 NOTE — Addendum Note (Signed)
Encounter addended by: Heywood Footman, RN on: 06/11/2016  4:41 PM<BR>    Actions taken: Order list changed, Diagnosis association updated, MAR administration accepted

## 2016-06-14 ENCOUNTER — Ambulatory Visit: Payer: Medicaid Other

## 2016-06-14 ENCOUNTER — Ambulatory Visit
Admission: RE | Admit: 2016-06-14 | Discharge: 2016-06-14 | Disposition: A | Discharge status: Home / Self Care | Payer: Medicaid Other | Source: Ambulatory Visit | Attending: Radiation Oncology | Admitting: Radiation Oncology

## 2016-06-14 DIAGNOSIS — Z510 Encounter for antineoplastic radiation therapy: Secondary | ICD-10-CM | POA: Diagnosis not present

## 2016-06-15 ENCOUNTER — Telehealth: Payer: Self-pay | Admitting: *Deleted

## 2016-06-15 ENCOUNTER — Ambulatory Visit: Payer: Self-pay | Admitting: Internal Medicine

## 2016-06-15 ENCOUNTER — Ambulatory Visit: Payer: Medicaid Other

## 2016-06-15 ENCOUNTER — Other Ambulatory Visit: Payer: Self-pay | Admitting: Surgery

## 2016-06-15 ENCOUNTER — Other Ambulatory Visit: Payer: Self-pay

## 2016-06-15 ENCOUNTER — Telehealth: Payer: Self-pay | Admitting: Radiation Oncology

## 2016-06-15 ENCOUNTER — Ambulatory Visit: Payer: Self-pay

## 2016-06-15 DIAGNOSIS — C3490 Malignant neoplasm of unspecified part of unspecified bronchus or lung: Secondary | ICD-10-CM

## 2016-06-15 NOTE — Telephone Encounter (Signed)
Phoned patient to inquire why he cancelled his radiation treatment for today. Unable to reach patient phoned his girlfriend, Almyra Free. Almyra Free reports he is resting. She confirms he still has the chest tube in place. She reports he is not having difficulty breathing, shortness of breath or cough. She reports he cancelled due t pain at chest tube site. Questioned if she/they have reached out to Dr. Cyndia Bent. She confirms they have and that Dr. Cyndia Bent plans to see them tomorrow at 0930. Stressed to [place] that if the patient begins to experience SOB or dyspnea to call 911. She verbalized understanding.

## 2016-06-15 NOTE — Telephone Encounter (Signed)
Patient canceled appts for today due to chest tube still in place.

## 2016-06-15 NOTE — Telephone Encounter (Signed)
Received a voicemail message from Ferdinand Lango that the patient will not be present today for radiation therapy. Informed Melanie, RT on L3 of this finding.

## 2016-06-16 ENCOUNTER — Ambulatory Visit
Admission: RE | Admit: 2016-06-16 | Discharge: 2016-06-16 | Disposition: A | Discharge status: Home / Self Care | Payer: Medicaid Other | Source: Ambulatory Visit | Attending: Surgery | Admitting: Surgery

## 2016-06-16 ENCOUNTER — Encounter: Payer: Self-pay | Admitting: *Deleted

## 2016-06-16 ENCOUNTER — Ambulatory Visit
Admission: RE | Admit: 2016-06-16 | Discharge: 2016-06-16 | Disposition: A | Discharge status: Home / Self Care | Payer: Medicaid Other | Source: Ambulatory Visit | Attending: Radiation Oncology | Admitting: Radiation Oncology

## 2016-06-16 ENCOUNTER — Telehealth: Payer: Self-pay | Admitting: Internal Medicine

## 2016-06-16 ENCOUNTER — Ambulatory Visit: Payer: Medicaid Other

## 2016-06-16 ENCOUNTER — Ambulatory Visit (INDEPENDENT_AMBULATORY_CARE_PROVIDER_SITE_OTHER): Payer: Medicaid Other | Admitting: Surgery

## 2016-06-16 ENCOUNTER — Other Ambulatory Visit: Payer: Self-pay | Admitting: *Deleted

## 2016-06-16 ENCOUNTER — Other Ambulatory Visit: Payer: Self-pay | Admitting: Surgery

## 2016-06-16 ENCOUNTER — Encounter: Payer: Self-pay | Admitting: Surgery

## 2016-06-16 VITALS — BP 118/80 | HR 120 | Resp 20 | Ht 68.0 in | Wt 154.0 lb

## 2016-06-16 DIAGNOSIS — J939 Pneumothorax, unspecified: Secondary | ICD-10-CM

## 2016-06-16 DIAGNOSIS — C3490 Malignant neoplasm of unspecified part of unspecified bronchus or lung: Secondary | ICD-10-CM

## 2016-06-16 DIAGNOSIS — Z510 Encounter for antineoplastic radiation therapy: Secondary | ICD-10-CM | POA: Diagnosis not present

## 2016-06-16 NOTE — Progress Notes (Signed)
Oncology Nurse Navigator Documentation  Oncology Nurse Navigator Flowsheets 06/16/2016  Navigator Location [facility]  Navigator Encounter Type Other/I received a call from [employer] at [employer].  She updated me that Douglas Kelly's CT was out today.  I notified Rad Onc of that information.  I also went over patient's schedule with Dr. Julien Nordmann to make sure Douglas Kelly was on track with treatment plan. He has follow up with Dr. Julien Nordmann on 2/12 and treatment. Dr. Julien Nordmann is ok with this.   Treatment Phase Treatment  Barriers/Navigation Needs Coordination of Care  Interventions Coordination of Care  Coordination of Care Other  Acuity Level 2  Acuity Level 2 Other  Time Spent with Patient 30

## 2016-06-16 NOTE — Progress Notes (Signed)
HPI:  [name] returns today for follow up of his left chest tube placed for recurrent large left pneumothorax in the setting of treatment for advanced lung cancer. He had a tube placed on admission and the pneumothorax and air leak resolved. After the tube was removed he developed a recurrent large left pneumothorax the next day and had to have another chest tube. He still had some air leak and we decided to send him home with the tube attached to a mini-express. I saw him last week and there was no air leak and CXR looked good. He continues to do well considering his cancer and has no SOB. His only complaint is pain from the chest tube.  Current Outpatient Prescriptions  Medication Sig Dispense Refill  . acetaminophen (TYLENOL) 325 MG tablet Take 2 tablets (650 mg total) by mouth every 6 (six) hours as needed for mild pain (or Fever >/= 101).    Marland Kitchen albuterol-ipratropium (COMBIVENT) 18-103 MCG/ACT inhaler Inhale 1 puff into the lungs every 4 (four) hours as needed for wheezing or shortness of breath. 1 Inhaler 0  . bisacodyl (BISACODYL) 5 MG EC tablet Take 1 tablet (5 mg total) by mouth daily as needed for moderate constipation. 30 tablet 0  . chlorpheniramine-HYDROcodone (TUSSIONEX) 10-8 MG/5ML SUER Take 5 mLs by mouth every 12 (twelve) hours as needed for cough. 140 mL 0  . fentaNYL (DURAGESIC) 50 MCG/HR Place 1 patch (50 mcg total) onto the skin every 3 (three) days. 5 patch 0  . hyaluronate sodium (RADIAPLEXRX) GEL Apply 1 application topically 2 (two) times daily.    . nicotine (NICODERM CQ - DOSED IN MG/24 HOURS) 21 mg/24hr patch Place 1 patch (21 mg total) onto the skin daily. 28 patch 0  . oxyCODONE (ROXICODONE) 15 MG immediate release tablet Take 1 tablet (15 mg total) by mouth every 4 (four) hours as needed. 60 tablet 0  . prochlorperazine (COMPAZINE) 10 MG tablet Take 1 tablet (10 mg total) by mouth every 6 (six) hours as needed for nausea or vomiting. 30 tablet 0  . vitamin B-12 1000  MCG tablet Take 1 tablet (1,000 mcg total) by mouth daily. 30 tablet 0   No current facility-administered medications for this visit.      Physical Exam: BP 118/80   Pulse (!) 120   Resp 20   Ht '5\' 8"'$  (1.727 m)   Wt 154 lb (69.9 kg)   SpO2 95% Comment: RA  BMI 23.42 kg/m  He looks comfortable Lungs are clear The left chest tube is in place with slight redness around the tube site.  There is no air leak from the chest tube.   Diagnostic Tests:  CLINICAL DATA:  Left-sided chest pain, follow-up examination  EXAM: CHEST  2 VIEW  COMPARISON:  06/09/2016  FINDINGS: Cardiac shadow is stable. The right lung is hyperinflated. Volume loss on the left is noted stable from the prior exam. Left chest tube is seen without pneumothorax. Stable consolidation in the left lower lobe is noted. No new focal abnormality is noted.  IMPRESSION: Resolution of previously seen left-sided pneumothorax.  Persistent left lower lobe consolidation. Left chest tube remains in place.   Electronically Signed   By: Inez Catalina M.D.   On: 06/16/2016 09:29  Impression:  The chest tube has not had an air leak for the past two weeks that I have seen him and CXR looks stable so the tube was removed without difficulty. A vaseline dressing was applied  and I told him to leave that in place for 3 days. Follow up CXR shows no ptx.  Plan:  I will see him back in one week with a CXR and will remove the chest tube suture at that time.    Gaye Pollack, MD [facility] [phone]

## 2016-06-16 NOTE — Telephone Encounter (Signed)
lvm to inform pt of r/s appts to 2/12 at 0930 per LOS

## 2016-06-17 ENCOUNTER — Ambulatory Visit: Payer: Medicaid Other

## 2016-06-17 ENCOUNTER — Ambulatory Visit
Admission: RE | Admit: 2016-06-17 | Discharge: 2016-06-17 | Disposition: A | Discharge status: Home / Self Care | Payer: Medicaid Other | Source: Ambulatory Visit | Attending: Radiation Oncology | Admitting: Radiation Oncology

## 2016-06-17 DIAGNOSIS — Z510 Encounter for antineoplastic radiation therapy: Secondary | ICD-10-CM | POA: Diagnosis not present

## 2016-06-18 ENCOUNTER — Encounter: Payer: Self-pay | Admitting: Radiation Oncology

## 2016-06-18 ENCOUNTER — Ambulatory Visit
Admission: RE | Admit: 2016-06-18 | Discharge: 2016-06-18 | Disposition: A | Discharge status: Home / Self Care | Payer: Medicaid Other | Source: Ambulatory Visit | Attending: Radiation Oncology | Admitting: Radiation Oncology

## 2016-06-18 ENCOUNTER — Ambulatory Visit: Payer: Medicaid Other

## 2016-06-18 VITALS — BP 110/82 | HR 112 | Temp 98.2°F | Resp 18 | Ht 68.0 in | Wt 147.4 lb

## 2016-06-18 DIAGNOSIS — C3492 Malignant neoplasm of unspecified part of left bronchus or lung: Secondary | ICD-10-CM

## 2016-06-18 DIAGNOSIS — Z510 Encounter for antineoplastic radiation therapy: Secondary | ICD-10-CM | POA: Diagnosis not present

## 2016-06-18 NOTE — Progress Notes (Signed)
  Radiation Oncology         [phone]   Name: Douglas Kelly MRN: [id number]   Date: 06/18/2016  DOB: [birth year]     Weekly Radiation Therapy Management    ICD-9-CM ICD-10-CM   1. 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  Weight and vitals stable. Reports left side pain at chest tube insertion site 4 on a scale of 0-10. Left chest tube dressing dry and intact. Had chest tube removed on Wednesday, afebrile at 98.2. Reports a dry cough. Reports SOB is unchanged. Reports difficult painful swallowing has resolved. Reports fatigue. Scheduled to see Dr. Julien Nordmann on Monday. The patient's weight is down 7 pounds, with appetite improving. States he does not eat much when he has chemotherapy. He did not have chemotherapy this week on Tuesday.    Set-up films were reviewed. The chart was checked.  Physical Findings  height is '5\' 8"'$  (1.727 m) and weight is 147 lb 6.4 oz (66.9 kg). His oral temperature is 98.2 F (36.8 C). His blood pressure is 110/82 and his pulse is 112 (abnormal). His respiration is 18 and oxygen saturation is 98%. . Weight loss of 7 lbs since 06/16/16. Alert and in no acute distress.   Impression The patient is tolerating radiation.  Plan Continue treatment as planned.      Sheral Apley Tammi Klippel, M.D.  This document serves as a record of services personally performed by [name], MD. It was created on his behalf by Arlyce Harman, a trained medical scribe. The creation of this record is based on the scribe's personal observations and the provider's statements to them. This document has been checked and approved by the attending provider.

## 2016-06-18 NOTE — Progress Notes (Addendum)
Weight and vitals stable. Reports left side pain at chest tube insertion site 4 on a scale of 0-10.   Left chest tube dressing dry and intact. Had chest tube removed on Wednesday afebrile at 98.2.  Reports a dry cough. Reports SOB is unchanged. Reports difficult painful swallowing has resolved. Reports fatigue. Sees Douglas Kelly Monday. Wt Readings from Last 3 Encounters:  06/18/16 147 lb 6.4 oz (66.9 kg)  06/16/16 154 lb (69.9 kg)  06/11/16 154 lb (69.9 kg)  Weight down 7 pounds appetite is improving states he does not eat much when he has chemotherapy.  Did not have chemotherapy this week on Tuesday, will receive chemotherapy Monday and Tuesday this next week. BP 113/77   Pulse (!) 103   Temp 98.2 F (36.8 C) (Oral)   Resp 18   Ht '5\' 8"'$  (1.727 m)   Wt 147 lb 6.4 oz (66.9 kg)   SpO2 98%   BMI 22.41 kg/m Right arm sitting BP 110/82   Pulse (!) 112   Temp 98.2 F (36.8 C) (Oral)   Resp 18   Ht '5\' 8"'$  (1.727 m)   Wt 147 lb 6.4 oz (66.9 kg)   SpO2 98%   BMI 22.41 kg/m Right arm standing

## 2016-06-21 ENCOUNTER — Ambulatory Visit: Payer: Medicaid Other

## 2016-06-21 ENCOUNTER — Telehealth: Payer: Self-pay | Admitting: Internal Medicine

## 2016-06-21 ENCOUNTER — Ambulatory Visit: Payer: Self-pay | Admitting: Internal Medicine

## 2016-06-21 ENCOUNTER — Other Ambulatory Visit: Payer: Self-pay

## 2016-06-21 ENCOUNTER — Ambulatory Visit: Payer: Self-pay

## 2016-06-21 NOTE — Telephone Encounter (Signed)
Patient just called and said that he missed his 10 am appointment he just woke up.  Not sure what you wanted him to do come in or reschedule Call back number is [phone]

## 2016-06-21 NOTE — Telephone Encounter (Signed)
sw pt to confirm r/s appts to 2/16 per LOS

## 2016-06-22 ENCOUNTER — Ambulatory Visit: Payer: Medicaid Other

## 2016-06-22 ENCOUNTER — Ambulatory Visit
Admission: RE | Admit: 2016-06-22 | Discharge: 2016-06-22 | Disposition: A | Discharge status: Home / Self Care | Payer: Medicaid Other | Source: Ambulatory Visit | Attending: Radiation Oncology | Admitting: Radiation Oncology

## 2016-06-22 ENCOUNTER — Other Ambulatory Visit: Payer: Self-pay | Admitting: Surgery

## 2016-06-22 ENCOUNTER — Other Ambulatory Visit: Payer: Self-pay | Admitting: Radiation Oncology

## 2016-06-22 DIAGNOSIS — C3492 Malignant neoplasm of unspecified part of left bronchus or lung: Secondary | ICD-10-CM

## 2016-06-22 DIAGNOSIS — Z510 Encounter for antineoplastic radiation therapy: Secondary | ICD-10-CM | POA: Diagnosis not present

## 2016-06-22 DIAGNOSIS — C3490 Malignant neoplasm of unspecified part of unspecified bronchus or lung: Secondary | ICD-10-CM

## 2016-06-22 MED ORDER — OXYCODONE HCL 10 MG PO TABS: 10.0000 mg | ORAL_TABLET | ORAL | 0 refills | Status: DC | PRN

## 2016-06-22 MED ORDER — OXYCODONE HCL 15 MG PO TABS: 15.0000 mg | ORAL_TABLET | ORAL | 0 refills | Status: DC | PRN

## 2016-06-22 MED ORDER — FENTANYL 50 MCG/HR TD PT72: 50.0000 ug | MEDICATED_PATCH | TRANSDERMAL | 0 refills | Status: DC

## 2016-06-22 MED FILL — oxyCODONE HCL 5 MG TABS: 5 | 10 days supply | Qty: 120 | Fill #0

## 2016-06-23 ENCOUNTER — Ambulatory Visit
Admission: RE | Admit: 2016-06-23 | Discharge: 2016-06-23 | Disposition: A | Discharge status: Home / Self Care | Payer: Medicaid Other | Source: Ambulatory Visit | Attending: Radiation Oncology | Admitting: Radiation Oncology

## 2016-06-23 ENCOUNTER — Ambulatory Visit: Payer: Medicaid Other

## 2016-06-23 ENCOUNTER — Encounter: Payer: Self-pay | Admitting: Surgery

## 2016-06-23 DIAGNOSIS — Z510 Encounter for antineoplastic radiation therapy: Secondary | ICD-10-CM | POA: Diagnosis not present

## 2016-06-24 ENCOUNTER — Ambulatory Visit
Admission: RE | Admit: 2016-06-24 | Discharge: 2016-06-24 | Disposition: A | Discharge status: Home / Self Care | Payer: Medicaid Other | Source: Ambulatory Visit | Attending: Radiation Oncology | Admitting: Radiation Oncology

## 2016-06-24 ENCOUNTER — Ambulatory Visit: Payer: Medicaid Other

## 2016-06-24 DIAGNOSIS — Z510 Encounter for antineoplastic radiation therapy: Secondary | ICD-10-CM | POA: Diagnosis not present

## 2016-06-25 ENCOUNTER — Ambulatory Visit (HOSPITAL_BASED_OUTPATIENT_CLINIC_OR_DEPARTMENT_OTHER): Payer: Medicaid Other | Admitting: Internal Medicine

## 2016-06-25 ENCOUNTER — Ambulatory Visit: Payer: Medicaid Other

## 2016-06-25 ENCOUNTER — Telehealth: Payer: Self-pay | Admitting: *Deleted

## 2016-06-25 ENCOUNTER — Other Ambulatory Visit (HOSPITAL_BASED_OUTPATIENT_CLINIC_OR_DEPARTMENT_OTHER): Payer: Medicaid Other

## 2016-06-25 ENCOUNTER — Ambulatory Visit
Admission: RE | Admit: 2016-06-25 | Discharge: 2016-06-25 | Disposition: A | Discharge status: Home / Self Care | Payer: Medicaid Other | Source: Ambulatory Visit | Attending: Radiation Oncology | Admitting: Radiation Oncology

## 2016-06-25 ENCOUNTER — Encounter: Payer: Self-pay | Admitting: Internal Medicine

## 2016-06-25 ENCOUNTER — Telehealth: Payer: Self-pay | Admitting: Internal Medicine

## 2016-06-25 VITALS — BP 108/76 | HR 107 | Resp 18 | Ht 68.0 in | Wt 146.6 lb

## 2016-06-25 VITALS — BP 118/91 | HR 86 | Temp 97.7°F | Resp 18 | Wt 146.6 lb

## 2016-06-25 DIAGNOSIS — C3492 Malignant neoplasm of unspecified part of left bronchus or lung: Secondary | ICD-10-CM

## 2016-06-25 DIAGNOSIS — E43 Unspecified severe protein-calorie malnutrition: Secondary | ICD-10-CM

## 2016-06-25 DIAGNOSIS — G893 Neoplasm related pain (acute) (chronic): Secondary | ICD-10-CM | POA: Diagnosis not present

## 2016-06-25 DIAGNOSIS — Z510 Encounter for antineoplastic radiation therapy: Secondary | ICD-10-CM | POA: Diagnosis not present

## 2016-06-25 DIAGNOSIS — Z5111 Encounter for antineoplastic chemotherapy: Secondary | ICD-10-CM

## 2016-06-25 LAB — CBC WITH DIFFERENTIAL/PLATELET
BASO%: 0.4 % (ref 0.0–2.0)
BASOS ABS: 0 10*3/uL (ref 0.0–0.1)
EOS ABS: 0.2 10*3/uL (ref 0.0–0.5)
EOS%: 2.9 % (ref 0.0–7.0)
HCT: 38.3 % — ABNORMAL LOW (ref 38.4–49.9)
HGB: 13 g/dL (ref 13.0–17.1)
LYMPH%: 8 % — AB (ref 14.0–49.0)
MCH: 30.7 pg (ref 27.2–33.4)
MCHC: 33.9 g/dL (ref 32.0–36.0)
MCV: 90.3 fL (ref 79.3–98.0)
MONO#: 0.4 10*3/uL (ref 0.1–0.9)
MONO%: 7.2 % (ref 0.0–14.0)
NEUT#: 4.2 10*3/uL (ref 1.5–6.5)
NEUT%: 81.5 % — AB (ref 39.0–75.0)
PLATELETS: 257 10*3/uL (ref 140–400)
RBC: 4.24 10*6/uL (ref 4.20–5.82)
RDW: 14.4 % (ref 11.0–14.6)
WBC: 5.1 10*3/uL (ref 4.0–10.3)
lymph#: 0.4 10*3/uL — ABNORMAL LOW (ref 0.9–3.3)

## 2016-06-25 LAB — COMPREHENSIVE METABOLIC PANEL
ALT: 18 U/L (ref 0–55)
ANION GAP: 10 meq/L (ref 3–11)
AST: 9 U/L (ref 5–34)
Albumin: 3.5 g/dL (ref 3.5–5.0)
Alkaline Phosphatase: 89 U/L (ref 40–150)
BUN: 10.7 mg/dL (ref 7.0–26.0)
CHLORIDE: 107 meq/L (ref 98–109)
CO2: 23 meq/L (ref 22–29)
Calcium: 9.6 mg/dL (ref 8.4–10.4)
Creatinine: 0.9 mg/dL (ref 0.7–1.3)
Glucose: 162 mg/dl — ABNORMAL HIGH (ref 70–140)
POTASSIUM: 4.4 meq/L (ref 3.5–5.1)
Sodium: 141 mEq/L (ref 136–145)
Total Bilirubin: <0.22 mg/dL (ref 0.20–1.20)
Total Protein: 7.3 g/dL (ref 6.4–8.3)

## 2016-06-25 NOTE — Telephone Encounter (Signed)
Appointments scheduled per 2/16 LOS. Patient given AVS report and calendars with future scheduled appointments.

## 2016-06-25 NOTE — Progress Notes (Signed)
[facility] [phone]   [phone]  OFFICE PROGRESS NOTE  No PCP Per Patient No address on file  DIAGNOSIS: Stage IIIA (T2a, N2, M0) non-small cell lung cancer, squamous cell carcinoma diagnosed in December 2017 with almost complete collapse of the left lung.  PRIOR THERAPY: None  CURRENT THERAPY: Concurrent chemoradiation with weekly carboplatin for AUC of 2 and paclitaxel 45 MG/M2. The patient is status post 6 weeks of radiation but only 2 cycles of chemotherapy because he missed several appointments.  INTERVAL HISTORY: Douglas Kelly 46 y.o. male came to the clinic today for follow-up visit accompanied by his girlfriend. The patient is feeling fine today with no specific complaints except for mild fatigue and shortness of breath with exertion. He is currently undergoing a course of concurrent chemoradiation but unfortunately missed several of his chemotherapy appointment and most recently because of chest tube placement and pneumothorax on the left side. He denied having any current chest pain but continues to have wheezing with no cough or hemoptysis. He denied having any weight loss or night sweats. He has no nausea, vomiting, diarrhea or constipation. He denied having any fever or chills. He has no headache or visual changes. He is expected to complete the course of radiotherapy on 06/28/2016.  MEDICAL HISTORY: Past Medical History:  Diagnosis Date  . Encounter for antineoplastic chemotherapy 05/14/2016  . Pneumonia     ALLERGIES:  has No Known Allergies.  MEDICATIONS:  Current Outpatient Prescriptions  Medication Sig Dispense Refill  . acetaminophen (TYLENOL) 325 MG tablet Take 2 tablets (650 mg total) by mouth every 6 (six) hours as needed for mild pain (or Fever >/= 101).    Marland Kitchen albuterol-ipratropium (COMBIVENT) 18-103 MCG/ACT inhaler Inhale 1 puff into the lungs every 4 (four) hours as needed for wheezing or shortness of breath. 1 Inhaler 0   . bisacodyl (BISACODYL) 5 MG EC tablet Take 1 tablet (5 mg total) by mouth daily as needed for moderate constipation. 30 tablet 0  . hyaluronate sodium (RADIAPLEXRX) GEL Apply 1 application topically 2 (two) times daily.    . nicotine (NICODERM CQ - DOSED IN MG/24 HOURS) 21 mg/24hr patch Place 1 patch (21 mg total) onto the skin daily. 28 patch 0  . Oxycodone HCl 10 MG TABS Take 1 tablet (10 mg total) by mouth every 4 (four) hours as needed. 60 tablet 0  . prochlorperazine (COMPAZINE) 10 MG tablet Take 1 tablet (10 mg total) by mouth every 6 (six) hours as needed for nausea or vomiting. 30 tablet 0  . vitamin B-12 1000 MCG tablet Take 1 tablet (1,000 mcg total) by mouth daily. 30 tablet 0   No current facility-administered medications for this visit.     SURGICAL HISTORY:  Past Surgical History:  Procedure Laterality Date  . VIDEO BRONCHOSCOPY Bilateral 04/19/2016   Procedure: VIDEO BRONCHOSCOPY WITHOUT FLUORO;  Surgeon: Juanito Doom, MD;  Location: [facility];  Service: Cardiopulmonary;  Laterality: Bilateral;    REVIEW OF SYSTEMS:  Constitutional: positive for fatigue Eyes: negative Ears, nose, mouth, throat, and face: negative Respiratory: positive for dyspnea on exertion and wheezing Cardiovascular: negative Gastrointestinal: negative Genitourinary:negative Integument/breast: negative Hematologic/lymphatic: negative Musculoskeletal:negative Neurological: negative Behavioral/Psych: negative Endocrine: negative Allergic/Immunologic: negative   PHYSICAL EXAMINATION: General appearance: alert, cooperative, fatigued and no distress Head: Normocephalic, without obvious abnormality, atraumatic Neck: no adenopathy, no JVD, supple, symmetrical, trachea midline and thyroid not enlarged, symmetric, no tenderness/mass/nodules Lymph nodes: Cervical, supraclavicular, and axillary nodes  normal. Resp: wheezes bilaterally Back: symmetric, no curvature. ROM normal. No CVA  tenderness. Cardio: regular rate and rhythm, S1, S2 normal, no murmur, click, rub or gallop GI: soft, non-tender; bowel sounds normal; no masses,  no organomegaly Extremities: extremities normal, atraumatic, no cyanosis or edema Neurologic: Alert and oriented X 3, normal strength and tone. Normal symmetric reflexes. Normal coordination and gait  ECOG PERFORMANCE STATUS: 1 - Symptomatic but completely ambulatory  Blood pressure 108/76, pulse (!) 107, resp. rate 18, height '5\' 8"'$  (1.727 m), weight 146 lb 9.6 oz (66.5 kg), SpO2 96 %.  LABORATORY DATA: Lab Results  Component Value Date   WBC 5.1 06/25/2016   HGB 13.0 06/25/2016   HCT 38.3 (L) 06/25/2016   MCV 90.3 06/25/2016   PLT 257 06/25/2016      Chemistry      Component Value Date/Time   NA 141 06/25/2016 1006   K 4.4 06/25/2016 1006   CL 103 05/22/2016 1530   CO2 23 06/25/2016 1006   BUN 10.7 06/25/2016 1006   CREATININE 0.9 06/25/2016 1006      Component Value Date/Time   CALCIUM 9.6 06/25/2016 1006   ALKPHOS 89 06/25/2016 1006   AST 9 06/25/2016 1006   ALT 18 06/25/2016 1006   BILITOT <0.22 06/25/2016 1006       RADIOGRAPHIC STUDIES: Dg Chest 1 View  Result Date: 06/01/2016 CLINICAL DATA:  Follow-up left-sided pneumothorax with chest tube treatment. History of left-sided lung malignancy in recent episode of pneumonia EXAM: CHEST 1 VIEW COMPARISON:  Portable chest x-ray of May 31, 2016 FINDINGS: A faint pleural line is visible lying between the posterior aspects of the third and fourth ribs on the left. The tip of the left chest tube overlies the lateral aspect of the left fourth rib. There is mild shift of mediastinum toward the left. There is left basilar density which appears stable. There is no large pleural effusion. The right lung is hyperinflated and clear. The heart and pulmonary vascularity are normal. The bony thorax exhibits no acute abnormality. IMPRESSION: A less than 10% left apical pneumothorax is  visible today. The left-sided chest tube is in stable position. Persistent left basilar consolidation. Who Electronically Signed   By: David  Martinique M.D.   On: 06/01/2016 07:30   Dg Chest 2 View  Result Date: 06/16/2016 CLINICAL DATA:  Left-sided chest pain, follow-up examination EXAM: CHEST  2 VIEW COMPARISON:  06/09/2016 FINDINGS: Cardiac shadow is stable. The right lung is hyperinflated. Volume loss on the left is noted stable from the prior exam. Left chest tube is seen without pneumothorax. Stable consolidation in the left lower lobe is noted. No new focal abnormality is noted. IMPRESSION: Resolution of previously seen left-sided pneumothorax. Persistent left lower lobe consolidation. Left chest tube remains in place. Electronically Signed   By: Inez Catalina M.D.   On: 06/16/2016 09:29   Dg Chest 2 View  Result Date: 06/09/2016 CLINICAL DATA:  Pneumothorax. EXAM: CHEST  2 VIEW COMPARISON:  Radiographs of June 02, 2016. FINDINGS: Stable cardiomediastinal silhouette. Left-sided chest tube is unchanged in position. Minimal left apical pneumothorax is noted which is slightly decreased compared to prior exam stable consolidation or atelectasis is noted in the left lower lobe. No significant pleural effusion is noted. Hyperexpansion of the right lung is again noted, with mediastinal shift to the left. Bony thorax is unremarkable. IMPRESSION: Left-sided chest tube is unchanged in position with minimal left apical pneumothorax, which is slightly decreased compared to prior exam.  Stable left lower lobe consolidation or atelectasis is noted. Stable hyperexpansion of the right lung with mediastinal shift to the left. Electronically Signed   By: Marijo Conception, M.D.   On: 06/09/2016 10:05   Dg Chest 2 View  Result Date: 06/02/2016 CLINICAL DATA:  Follow-up left-sided pneumothorax. History of pneumonia and squamous cell carcinoma of the left lung. EXAM: CHEST  2 VIEW COMPARISON:  Portable chest x-ray of  June 01, 2016 FINDINGS: There is persistent volume loss on the left. The small left apical pneumothorax amounts to approximately 5% of the lung volume. The pleural line remains between the third and fourth posterior ribs. The left-sided chest tube is in stable position with the tip of the tube projecting over the posterolateral aspect of the fourth rib. A small amount of subcutaneous emphysema is noted in the left axillary region. There is persistent consolidation -atelectasis of the left lower lobe. There is hyperinflation of the right lung with mild mediastinal shift to the left. The heart and pulmonary vascularity are normal. IMPRESSION: Fairly stable appearance of the chest with a 5% or less left apical pneumothorax. The chest tube is in stable position. Persistent atelectasis of much of the left lower lobe. Electronically Signed   By: David  Martinique M.D.   On: 06/02/2016 08:13   Dg Chest 2 View  Addendum Date: 05/29/2016   ADDENDUM REPORT: 05/29/2016 08:02 ADDENDUM: Findings called to the patient's nurse, Tawana. Electronically Signed   By: Dorise Bullion III M.D   On: 05/29/2016 08:02   Result Date: 05/29/2016 CLINICAL DATA:  Chest tube removal EXAM: CHEST  2 VIEW COMPARISON:  May 28, 2016 FINDINGS: The left chest tube has been removed and there is now a large left-sided pneumothorax. No shift of the heart or mediastinum to the right to suggest tension. There is an air-fluid level consistent with a hydropneumothorax. No right-sided pneumothorax is identified. No change in the cardiomediastinal silhouette. The study is otherwise stable. IMPRESSION: Left chest tube removal with development of volume large left hydropneumothorax. Electronically Signed: By: Dorise Bullion III M.D On: 05/29/2016 07:59   Dg Chest 2 View  Result Date: 05/28/2016 CLINICAL DATA:  Left-sided chest tube in place.  Pneumothorax. EXAM: CHEST  2 VIEW COMPARISON:  One day prior FINDINGS: Mild patient rotation to the left.  Mild left-sided volume loss with tracheal deviation left. Normal heart size. Similar small left pleural fluid component. Left apical pneumothorax is slightly decreased in size. Visceral pleural line maximally 11 mm from the chest wall today versus 14 mm on the prior. 5-10%. Similar left base airspace disease is likely atelectasis. Clear right lung. IMPRESSION: Left-sided hydropneumothorax, with minimal decrease in pleural air component, 5-10 percent. Persistent volume loss in the left hemithorax with left base atelectasis. Electronically Signed   By: Abigail Miyamoto M.D.   On: 05/28/2016 07:16   Ct Chest W Contrast  Result Date: 05/31/2016 CLINICAL DATA:  46 y/o M; left-sided chest pain. History of left lung cancer. EXAM: CT CHEST WITH CONTRAST TECHNIQUE: Multidetector CT imaging of the chest was performed during intravenous contrast administration. CONTRAST:  73m ISOVUE-300 IOPAMIDOL (ISOVUE-300) INJECTION 61% COMPARISON:  05/11/2016 head CT.  04/15/2016 CT chest. FINDINGS: Cardiovascular: No significant vascular findings. Normal heart size. No pericardial effusion. Mild coronary artery calcification. Mediastinum/Nodes: Decrease in size of sub- carinal and AP window lymph nodes, now subcentimeter. Lungs/Pleura: Irregular soft tissue centered in the left infrahilar region corresponding to malignancy on PET-CT. Improved pneumatization of the left lower lobe  with residual segmental consolidation and postobstructive pneumonitis. There is now stenotic but partial patency of several of the lobar bronchi in the left lung which were previously obstructed. Additionally, there is near complete re pneumatization of the left upper lobe with mild residual central ground-glass opacity possibly representing re-expansion edema. One of 2 right upper lobe ground-glass opacities is persistent although decreased in size measuring 4 mm (series 3, image 79). There is a small left-sided pneumothorax with chest tube in situ and small  volume of air within the left lateral chest wall. Upper Abdomen: No acute abnormality. Musculoskeletal: Mild S-shaped curvature of the spine. IMPRESSION: 1. Improved pneumatization of the left lower and upper lobes with residual segmental atelectasis and consolidation of the left lower lobe and near complete re-expansion of the left upper lobe. 2. New patency with stenosis of several left lung lobar and segmental bronchi that were previously occluded. 3. Persistent ill-defined left-sided hilar mass although probably decreased in size from prior studies given new patency of bronchi. 4. Decreased size of AP window and sub- carinal lymph nodes, now subcentimeter. 5. Small left-sided pneumothorax with chest tube in situ. Electronically Signed   By: Kristine Garbe M.D.   On: 05/31/2016 05:16   Dg Chest 2v Repeat Same Day  Result Date: 06/16/2016 CLINICAL DATA:  Chest tube removal. EXAM: CHEST  2 VIEW COMPARISON:  Earlier same day. FINDINGS: Left chest tube is been removed. Chest tube track is evident. No recurrent pneumothorax. Volume loss/ infiltrate in the left lung is the same or slightly worsened. Right chest remains clear. IMPRESSION: No pneumothorax post chest tube removal. Electronically Signed   By: Nelson Chimes M.D.   On: 06/16/2016 10:46   Dg Chest 1v Repeat Same Day  Result Date: 05/29/2016 CLINICAL DATA:  Status post chest tube insertion for pneumothorax. EXAM: CHEST - 1 VIEW SAME DAY COMPARISON:  05/29/2016 FINDINGS: There has been interval placement of a left-sided chest tube with decrease in volume of left-sided pneumothorax. Residual left-sided pneumothorax measures approximately 11 mm along the apical portion of the left lung. Atelectasis/ consolidation in the left base noted. There is a left pleural effusion. IMPRESSION: 1. Decrease in volume of left-sided pneumothorax status post chest tube insertion as above. Electronically Signed   By: Kerby Moors M.D.   On: 05/29/2016 13:01    Dg Chest Port 1 View  Result Date: 05/31/2016 CLINICAL DATA:  Left-sided pneumothorax with chest tube treatment. History of previous episodes of pneumonia with left lung collapse, former smoker. EXAM: PORTABLE CHEST 1 VIEW COMPARISON:  Chest x-ray and chest CT scan of May 30, 2016. FINDINGS: The tiny left apical pneumothorax is not visible today. The tip of the left-sided chest tube projects over the lateral aspect of the third and fourth rib interspace. There is a small amount of subcutaneous emphysema and axillary region. There is volume loss on the left. There is dense infiltrate or atelectasis in the left lower lobe. A trace of pleural fluid on the left may be present. The right lung is hyperinflated and clear. The heart is not enlarged. The pulmonary vascularity is normal. The observed bony thorax exhibits no acute abnormality. IMPRESSION: Slight interval improvement in the appearance of the left lower lobe consolidation. Persistent trace left pleural effusion. Stable mediastinal shift toward the left. The left apical pneumothorax is not visible today. The left chest tube is in stable position. Electronically Signed   By: David  Martinique M.D.   On: 05/31/2016 07:50   Dg Chest [facility]  1 View  Result Date: 05/30/2016 CLINICAL DATA:  Atelectasis in pneumothorax. EXAM: PORTABLE CHEST 1 VIEW COMPARISON:  May 29, 2016 FINDINGS: The small left apical pneumothorax is stable. A left chest tube remains in place. Minimal air in the left chest wall is stable. No right-sided pneumothorax. Opacity in the left base with volume loss on the left is unchanged. The study is otherwise unchanged. IMPRESSION: 1. Small left apical pneumothorax with a stable left-sided chest tube. 2. Volume loss on the left and left basilar opacity are stable. Electronically Signed   By: Dorise Bullion III M.D   On: 05/30/2016 09:11   Dg Chest Port 1 View  Result Date: 05/27/2016 CLINICAL DATA:  Pneumothorax EXAM: PORTABLE CHEST 1  VIEW COMPARISON:  May 26, 2016 FINDINGS: Chest tube unchanged on the left with small persistent left apical pneumothorax. No tension component. There remains patchy opacity in the left lower lobe with scattered areas of loculated air. The well-defined air-fluid levels seen in the left lower lobe region medially 1 day prior is not evident currently. There is a small left pleural effusion. Right lung is clear. Heart size is normal. Pulmonary vascularity appears normal. No evident adenopathy. IMPRESSION: Persistent small apical pneumothorax on the left with left chest tube in place. No tension component. Consolidation with loculated areas of fluid and air noted in the left base. The larger air-fluid level noted in the medial left base region 1 day prior is not evident on this examination. Differences in positioning may account for this difference in appearance. Note that there is a small left pleural effusion. Right lung clear. Stable cardiac silhouette. Electronically Signed   By: Lowella Grip III M.D.   On: 05/27/2016 08:11    ASSESSMENT AND PLAN:  This is a very pleasant 46 years old white male with a stage IIIa non-small cell lung cancer, squamous cell carcinoma was diagnosed in December 2017 with central obstructing the left lung mass as well as mediastinal lymphadenopathy. The patient is currently undergoing concurrent chemoradiation with weekly carboplatin and paclitaxel. He received only 2 cycles of systemic chemotherapy concurrent with radiation. He missed several of his chemotherapy appointments. He is expected to complete this course on 06/28/2016. I recommended for the patient to proceed with his chemotherapy next week as scheduled. I will arrange for him to come back for follow-up visit in one month's for reevaluation with repeat CT scan of the chest, abdomen and pelvis for restaging of his disease. For the recurrent left pneumothorax and pleural effusion, the patient is status post left  chest tube placement under the care of Dr. Cyndia Bent. For pain management, he will continue on oxycodone as needed. For smoke cessation, the patient is currently on nicotine patches. For the history of COPD, the patient will continue on Combivent. He was advised to call immediately if he has any concerning symptoms in the interval. The patient voices understanding of current disease status and treatment options and is in agreement with the current care plan.  All questions were answered. The patient knows to call the clinic with any problems, questions or concerns. We can certainly see the patient much sooner if necessary. I spent 15 minutes counseling the patient face to face. The total time spent in the appointment was 25 minutes.   Disclaimer: This note was dictated with voice recognition software. Similar sounding words can inadvertently be transcribed and may not be corrected upon review.

## 2016-06-25 NOTE — Progress Notes (Signed)
Weight and vitals stable. Reports left side pain at old chest tube site 5 on a scale of 0-10. Reports dry cough. Denies SOB. Denies pain or difficulty associated with swallowing. Reports hyperpigmentation without desquamation upper back. Reports using radiaplex bid as directed. Reports moderate fatigue. Chemotherapy was cancelled for today presumable due to low counts. Will return on Monday for chemo and final radiation treatment. Provided patient patient with one month follow up appointment card.   BP (!) 118/91 (BP Location: Left Arm, Patient Position: Sitting, Cuff Size: Normal)   Pulse 86   Temp 97.7 F (36.5 C) (Oral)   Resp 18   Wt 146 lb 9.6 oz (66.5 kg)   SpO2 100%   BMI 22.29 kg/m  Wt Readings from Last 3 Encounters:  06/25/16 146 lb 9.6 oz (66.5 kg)  06/25/16 146 lb 9.6 oz (66.5 kg)  06/18/16 147 lb 6.4 oz (66.9 kg)

## 2016-06-25 NOTE — Telephone Encounter (Signed)
Per 2/16 LOS and staff message I have scheduled appt. Notified the scheruelr

## 2016-06-25 NOTE — Telephone Encounter (Signed)
Error incorrect chart 

## 2016-06-25 NOTE — Progress Notes (Signed)
  Radiation Oncology         [phone]   Name: Douglas Kelly MRN: [id number]   Date: 06/25/2016  DOB: March 28, 1971     Weekly Radiation Therapy Management    ICD-9-CM ICD-10-CM   1. Stage III squamous cell carcinoma of left lung (HCC) 162.9 C34.92     Current Dose: 64 Gy  Planned Dose:  66 Gy  Narrative The patient presents for routine under treatment assessment.  Weight and vitals stable. The patient reports left sided pain at an old chest tube site as a 5/10, a dry cough, hyperpigmentation without desquamation in his upper back, and moderate fatigue. He denies SOB, pain or difficulty associated with swallowing, and is using radiaplex BID as directed. The patient missed several of his chemotherapy appointments and most recently because of chest tube placement and pneumothorax on the left side.  Set-up films were reviewed. The chart was checked.  Physical Findings  weight is 146 lb 9.6 oz (66.5 kg). His oral temperature is 97.7 F (36.5 C). His blood pressure is 118/91 (abnormal) and his pulse is 86. His respiration is 18 and oxygen saturation is 100%. . Weight loss of 7 lbs since 06/16/16. Alert and in no acute distress.   Impression The patient is tolerating radiation.  Plan Continue treatment as planned. The patient will complete radiation on 06/28/16. A one month follow up appointment card was provided.     Sheral Apley Tammi Klippel, M.D.  This document serves as a record of services personally performed by [name], MD. It was created on his behalf by Darcus Austin, a trained medical scribe. The creation of this record is based on the scribe's personal observations and the provider's statements to them. This document has been checked and approved by the attending provider.

## 2016-06-28 ENCOUNTER — Ambulatory Visit
Admission: RE | Admit: 2016-06-28 | Discharge: 2016-06-28 | Disposition: A | Discharge status: Home / Self Care | Payer: Medicaid Other | Source: Ambulatory Visit | Attending: Radiation Oncology | Admitting: Radiation Oncology

## 2016-06-28 ENCOUNTER — Ambulatory Visit (HOSPITAL_BASED_OUTPATIENT_CLINIC_OR_DEPARTMENT_OTHER): Payer: Medicaid Other

## 2016-06-28 VITALS — BP 119/84 | HR 86 | Temp 97.9°F | Resp 17

## 2016-06-28 DIAGNOSIS — Z510 Encounter for antineoplastic radiation therapy: Secondary | ICD-10-CM | POA: Diagnosis not present

## 2016-06-28 DIAGNOSIS — Z5111 Encounter for antineoplastic chemotherapy: Secondary | ICD-10-CM

## 2016-06-28 DIAGNOSIS — C3492 Malignant neoplasm of unspecified part of left bronchus or lung: Secondary | ICD-10-CM

## 2016-06-28 MED ORDER — PALONOSETRON HCL INJECTION 0.25 MG/5ML
0.2500 mg | Freq: Once | INTRAVENOUS | Status: AC
  Administered 2016-06-28: 0.25 mg via INTRAVENOUS

## 2016-06-28 MED ORDER — FAMOTIDINE IN NACL 20-0.9 MG/50ML-% IV SOLN
INTRAVENOUS | Status: AC
Start: 2016-06-28 — End: 2016-06-28
  Filled 2016-06-28: qty 50

## 2016-06-28 MED ORDER — DIPHENHYDRAMINE HCL 50 MG/ML IJ SOLN
50.0000 mg | Freq: Once | INTRAMUSCULAR | Status: AC
  Administered 2016-06-28: 50 mg via INTRAVENOUS

## 2016-06-28 MED ORDER — DIPHENHYDRAMINE HCL 50 MG/ML IJ SOLN
INTRAMUSCULAR | Status: AC
  Filled 2016-06-28: qty 1

## 2016-06-28 MED ORDER — DEXTROSE 5 % IV SOLN
45.0000 mg/m2 | Freq: Once | INTRAVENOUS | Status: AC
  Administered 2016-06-28: 84 mg via INTRAVENOUS
  Filled 2016-06-28: qty 14

## 2016-06-28 MED ORDER — SODIUM CHLORIDE 0.9 % IV SOLN
270.0000 mg | Freq: Once | INTRAVENOUS | Status: AC
  Administered 2016-06-28: 270 mg via INTRAVENOUS
  Filled 2016-06-28: qty 27

## 2016-06-28 MED ORDER — FAMOTIDINE IN NACL 20-0.9 MG/50ML-% IV SOLN
20.0000 mg | Freq: Once | INTRAVENOUS | Status: AC
  Administered 2016-06-28: 20 mg via INTRAVENOUS

## 2016-06-28 MED ORDER — SODIUM CHLORIDE 0.9 % IV SOLN
20.0000 mg | Freq: Once | INTRAVENOUS | Status: AC
  Administered 2016-06-28: 20 mg via INTRAVENOUS
  Filled 2016-06-28: qty 2

## 2016-06-28 MED ORDER — PALONOSETRON HCL INJECTION 0.25 MG/5ML
INTRAVENOUS | Status: AC
Start: 2016-06-28 — End: 2016-06-28
  Filled 2016-06-28: qty 5

## 2016-06-28 MED ORDER — SODIUM CHLORIDE 0.9 % IV SOLN
Freq: Once | INTRAVENOUS | Status: AC
  Administered 2016-06-28: 14:00:00 via INTRAVENOUS

## 2016-06-28 NOTE — Patient Instructions (Signed)
[facility] Discharge Instructions for Patients Receiving Chemotherapy  Today you received the following chemotherapy agents Taxol and Carboplatin. To help prevent nausea and vomiting after your treatment, we encourage you to take your nausea medication as directed.  If you develop nausea and vomiting that is not controlled by your nausea medication, call the clinic.   BELOW ARE SYMPTOMS THAT SHOULD BE REPORTED IMMEDIATELY:  *FEVER GREATER THAN 100.5 F  *CHILLS WITH OR WITHOUT FEVER  NAUSEA AND VOMITING THAT IS NOT CONTROLLED WITH YOUR NAUSEA MEDICATION  *UNUSUAL SHORTNESS OF BREATH  *UNUSUAL BRUISING OR BLEEDING  TENDERNESS IN MOUTH AND THROAT WITH OR WITHOUT PRESENCE OF ULCERS  *URINARY PROBLEMS  *BOWEL PROBLEMS  UNUSUAL RASH Items with * indicate a potential emergency and should be followed up as soon as possible.  Feel free to call the clinic you have any questions or concerns. The clinic phone number is [phone].  Please show the CHEMO ALERT CARD at check-in to the Emergency Department and triage nurse.    

## 2016-06-29 ENCOUNTER — Other Ambulatory Visit: Payer: Self-pay | Admitting: Surgery

## 2016-06-29 DIAGNOSIS — C3490 Malignant neoplasm of unspecified part of unspecified bronchus or lung: Secondary | ICD-10-CM

## 2016-06-30 ENCOUNTER — Encounter: Payer: Self-pay | Admitting: Surgery

## 2016-06-30 ENCOUNTER — Ambulatory Visit (INDEPENDENT_AMBULATORY_CARE_PROVIDER_SITE_OTHER): Payer: Medicaid Other | Admitting: Surgery

## 2016-06-30 ENCOUNTER — Ambulatory Visit
Admission: RE | Admit: 2016-06-30 | Discharge: 2016-06-30 | Disposition: A | Discharge status: Home / Self Care | Payer: Medicaid Other | Source: Ambulatory Visit | Attending: Surgery | Admitting: Surgery

## 2016-06-30 VITALS — BP 131/88 | HR 111 | Resp 16 | Ht 68.0 in | Wt 146.0 lb

## 2016-06-30 DIAGNOSIS — J939 Pneumothorax, unspecified: Secondary | ICD-10-CM

## 2016-06-30 DIAGNOSIS — C3490 Malignant neoplasm of unspecified part of unspecified bronchus or lung: Secondary | ICD-10-CM

## 2016-06-30 NOTE — Progress Notes (Signed)
     HPI:  He returns today for follow up after chest tube removal for recurrent large left pneumothorax in the setting of treatment for advanced lung cancer. I removed the chest tube on 06/16/2016 and he has been undergoing chemo and XRT. He feels better and has no shortness of breath. He is eating well.  Current Outpatient Prescriptions  Medication Sig Dispense Refill  . albuterol-ipratropium (COMBIVENT) 18-103 MCG/ACT inhaler Inhale 1 puff into the lungs every 4 (four) hours as needed for wheezing or shortness of breath. 1 Inhaler 0  . bisacodyl (BISACODYL) 5 MG EC tablet Take 1 tablet (5 mg total) by mouth daily as needed for moderate constipation. 30 tablet 0  . hyaluronate sodium (RADIAPLEXRX) GEL Apply 1 application topically 2 (two) times daily.    . nicotine (NICODERM CQ - DOSED IN MG/24 HOURS) 21 mg/24hr patch Place 1 patch (21 mg total) onto the skin daily. 28 patch 0  . Oxycodone HCl 10 MG TABS Take 1 tablet (10 mg total) by mouth every 4 (four) hours as needed. 60 tablet 0  . prochlorperazine (COMPAZINE) 10 MG tablet Take 1 tablet (10 mg total) by mouth every 6 (six) hours as needed for nausea or vomiting. 30 tablet 0   No current facility-administered medications for this visit.      Physical Exam: BP 131/88 (BP Location: Right Arm, Patient Position: Sitting, Cuff Size: Normal)   Pulse (!) 111   Resp 16   Ht '5\' 8"'$  (1.727 m)   Wt 146 lb (66.2 kg)   SpO2 96% Comment: ON RA  BMI 22.20 kg/m  He looks comfortable Lungs are clear The chest tube suture was removed.   Diagnostic Tests:  CLINICAL DATA:  History of lung carcinoma.  Shortness of breath  EXAM: CHEST  2 VIEW  COMPARISON:  June 16, 2016  FINDINGS: There is volume loss on the left. There is fibrotic type change in the left medial mid and lower lung zone regions, likely due to radiation fibrosis. Lungs elsewhere are clear. Heart size is normal. Pulmonary vascularity is stable and within normal limits  on the right and mildly distorted on the left, likely due to radiation therapy change. No adenopathy evident. No bone lesions evident.  IMPRESSION: Stable volume loss and radiation therapy change on the left. No edema or consolidation. Stable cardiac silhouette. No evident adenopathy.   Electronically Signed   By: Lowella Grip III M.D.   On: 06/30/2016 09:49   Impression:  His CXR looks good and there is no residual pneumothorax or effusion.   Plan:  He will continue to follow up with Dr. Julien Nordmann and Dr. Tammi Klippel and I will be happy to see him again if the need arises.   Gaye Pollack, MD [facility] [phone]

## 2016-07-01 ENCOUNTER — Encounter: Payer: Self-pay | Admitting: Radiation Oncology

## 2016-07-01 NOTE — Progress Notes (Signed)
  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  Beams/energy:   A five field 3D conformal treatment arrangement was used delivering 6 and 10 MV photons.  Daily image-guidance CT was used to align the treatment with the targeted volume  Narrative: The patient tolerated radiation treatment relatively well. The patient noted moderate fatigue. He reported a dry cough. He initially experienced shortness of breath and pain associated with swallowing, however this resolved by the completion of treatment. He experienced some radiation related skin reactions including hyperpigmentation without desquamation in the treatment field.  Plan: The patient has completed radiation treatment. The patient will return to radiation oncology clinic for routine followup in one month. I advised him to call or return sooner if he has any questions or concerns related to his recovery or treatment.  ________________________________  Sheral Apley. Tammi Klippel, M.D.  This document serves as a record of services personally performed by [name], MD. It was created on his behalf by Maryla Morrow, a trained medical scribe. The creation of this record is based on the scribe's personal observations and the provider's statements to them. This document has been checked and approved by the attending provider.

## 2016-07-02 ENCOUNTER — Other Ambulatory Visit: Payer: Self-pay | Admitting: Urology

## 2016-07-02 DIAGNOSIS — C3492 Malignant neoplasm of unspecified part of left bronchus or lung: Secondary | ICD-10-CM

## 2016-07-02 MED ORDER — OXYCODONE HCL ER 10 MG PO T12A: 10.0000 mg | EXTENDED_RELEASE_TABLET | Freq: Two times a day (BID) | ORAL | 0 refills | Status: DC

## 2016-07-05 ENCOUNTER — Other Ambulatory Visit: Payer: Self-pay | Admitting: Radiation Oncology

## 2016-07-05 MED ORDER — OXYCODONE HCL 10 MG PO TABS: 10.0000 mg | ORAL_TABLET | Freq: Four times a day (QID) | ORAL | 0 refills | Status: DC | PRN

## 2016-07-06 ENCOUNTER — Telehealth: Payer: Self-pay | Admitting: Radiation Oncology

## 2016-07-06 MED FILL — oxyCODONE HCL 10 MG TABS: 10 | 30 days supply | Qty: 120 | Fill #0

## 2016-07-06 NOTE — Telephone Encounter (Signed)
Received message from patient's girlfriend, Caryl Asp. She states, "the pain medication we picked up for him Friday wasn't the same he has been taking and now he is sick." Upon further investigation found that oxycontin 10 mg q12h had been prescribed instead of oxycodone 10 mg q4h as before. Joy reports the patient has been sick today with sweats, nausea, and vomiting. Informed Shona Simpson, PA-C of these finding. Per Alison's order instructed the patient to stop taking Oxycontin (medication filled Friday) and pick up new script for oxycodone 10 mg every six hours to begin taking. Explained that the patient needs to begin tapering off the oxycodone thus frequency was changed from every 4 to 6. Advised he should alternate oxycodone with motrin. Also, informed patient that per Shona Simpson, PA-C future refills of this medication would need to come from Dr. Julien Nordmann since he is no longer receiving radiation therapy. Patient verbalized understanding.

## 2016-07-07 ENCOUNTER — Ambulatory Visit: Payer: Self-pay | Admitting: Cardiothoracic Surgery

## 2016-07-09 ENCOUNTER — Telehealth: Payer: Self-pay | Admitting: Radiation Oncology

## 2016-07-09 NOTE — Telephone Encounter (Signed)
Phoned patient's home. No answer. No option to leave a message. Phoned patient's significant other, Almyra Free. No answer. Left message requesting return call. Plan to discuss with the patient slowly taper off his pain medication. Once we contact this RN will encourage patient to push interval of pain medication from every six hours to every eight hours next week then, the subsequent week every twelve hours. Left brief description of my intent on his significant other's voicemail. Also, reminded them of follow up appointment with Ashlyn on 07/27/16. Will continue to try and reach patient to discuss this further.

## 2016-07-12 ENCOUNTER — Telehealth: Payer: Self-pay | Admitting: *Deleted

## 2016-07-12 NOTE — Telephone Encounter (Signed)
CALLED PATIENT TO INFORM THAT FU APPT. HAS BEEN MOVED TO 07-27-16 @ 3:30 PM WITH ASLYN BRUNING, SPOKE WITH PATIENT AND HE IS AWARE OF THIS APPT.

## 2016-07-12 NOTE — Telephone Encounter (Signed)
XXXX 

## 2016-07-16 NOTE — Progress Notes (Addendum)
Douglas Kelly. Peri 46 y.o. 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  Weight changes, if any: Wt Readings from Last 3 Encounters:  07/27/16 151 lb (68.5 kg)  07/27/16 151 lb 3.2 oz (68.6 kg)  06/30/16 146 lb (66.2 kg)   Respiratory complaints, if any:SOB, dry coughing  Hemoptysis, if any: None Swallowing Problems/Pain/Difficulty swallowing:None Smoking Tobacco/Marijuana/Snuff/ETOH use:Former smoker x 1 P/D quit 04-13-16 no alcohol or drug usuage Appetite :Good eating three meals per day and drinking one ensure, snacking Pain:4/10 to back, left side and chest taking Oxycodone    When is next chemo scheduled?:07-27-16 Saw Dr. Delene Ruffini Immunotherapy with Imfinzi (Durvalumab) 10 MG/KG every 2 weeks, first dose 08/04/2016. Skin to chest and back with tanning, intact. Lab work from of chart:06-25-16 CBC w diff, [facility] Imaging:06-30-16 CXR BP 109/83   Pulse (!) 102   Temp 97.7 F (36.5 C) (Oral)   Resp 18   Ht '5\' 8"'$  (1.727 m)   Wt 151 lb (68.5 kg)   SpO2 96%   BMI 22.96 kg/m

## 2016-07-19 ENCOUNTER — Inpatient Hospital Stay: Admission: RE | Admit: 2016-07-19 | Payer: Self-pay | Source: Ambulatory Visit | Admitting: Radiation Oncology

## 2016-07-23 ENCOUNTER — Encounter (HOSPITAL_COMMUNITY): Payer: Self-pay

## 2016-07-23 ENCOUNTER — Ambulatory Visit (HOSPITAL_COMMUNITY)
Admission: RE | Admit: 2016-07-23 | Discharge: 2016-07-23 | Disposition: A | Discharge status: Home / Self Care | Payer: Medicaid Other | Source: Ambulatory Visit | Attending: Internal Medicine | Admitting: Internal Medicine

## 2016-07-23 ENCOUNTER — Other Ambulatory Visit (HOSPITAL_BASED_OUTPATIENT_CLINIC_OR_DEPARTMENT_OTHER): Payer: Medicaid Other

## 2016-07-23 ENCOUNTER — Telehealth: Payer: Self-pay | Admitting: Internal Medicine

## 2016-07-23 DIAGNOSIS — E43 Unspecified severe protein-calorie malnutrition: Secondary | ICD-10-CM | POA: Insufficient documentation

## 2016-07-23 DIAGNOSIS — R918 Other nonspecific abnormal finding of lung field: Secondary | ICD-10-CM | POA: Diagnosis not present

## 2016-07-23 DIAGNOSIS — Z5111 Encounter for antineoplastic chemotherapy: Secondary | ICD-10-CM

## 2016-07-23 DIAGNOSIS — J90 Pleural effusion, not elsewhere classified: Secondary | ICD-10-CM | POA: Insufficient documentation

## 2016-07-23 DIAGNOSIS — C3492 Malignant neoplasm of unspecified part of left bronchus or lung: Secondary | ICD-10-CM

## 2016-07-23 DIAGNOSIS — J479 Bronchiectasis, uncomplicated: Secondary | ICD-10-CM | POA: Diagnosis not present

## 2016-07-23 DIAGNOSIS — J439 Emphysema, unspecified: Secondary | ICD-10-CM | POA: Diagnosis not present

## 2016-07-23 LAB — CBC WITH DIFFERENTIAL/PLATELET
BASO%: 0.7 % (ref 0.0–2.0)
Basophils Absolute: 0 10*3/uL (ref 0.0–0.1)
EOS%: 2.9 % (ref 0.0–7.0)
Eosinophils Absolute: 0.2 10*3/uL (ref 0.0–0.5)
HCT: 41.4 % (ref 38.4–49.9)
HGB: 14.3 g/dL (ref 13.0–17.1)
LYMPH%: 10.9 % — ABNORMAL LOW (ref 14.0–49.0)
MCH: 31.9 pg (ref 27.2–33.4)
MCHC: 34.5 g/dL (ref 32.0–36.0)
MCV: 92.4 fL (ref 79.3–98.0)
MONO#: 0.7 10*3/uL (ref 0.1–0.9)
MONO%: 12.1 % (ref 0.0–14.0)
NEUT%: 73.4 % (ref 39.0–75.0)
NEUTROS ABS: 4.3 10*3/uL (ref 1.5–6.5)
PLATELETS: 229 10*3/uL (ref 140–400)
RBC: 4.48 10*6/uL (ref 4.20–5.82)
RDW: 14 % (ref 11.0–14.6)
WBC: 5.9 10*3/uL (ref 4.0–10.3)
lymph#: 0.6 10*3/uL — ABNORMAL LOW (ref 0.9–3.3)

## 2016-07-23 LAB — COMPREHENSIVE METABOLIC PANEL WITH GFR
ALT: 30 U/L (ref 0–55)
AST: 16 U/L (ref 5–34)
Albumin: 3.8 g/dL (ref 3.5–5.0)
Alkaline Phosphatase: 117 U/L (ref 40–150)
Anion Gap: 10 meq/L (ref 3–11)
BUN: 12.1 mg/dL (ref 7.0–26.0)
CO2: 27 meq/L (ref 22–29)
Calcium: 10.1 mg/dL (ref 8.4–10.4)
Chloride: 103 meq/L (ref 98–109)
Creatinine: 0.9 mg/dL (ref 0.7–1.3)
EGFR: >90 ml/min/1.73 m2
Glucose: 104 mg/dL (ref 70–140)
Potassium: 4.9 meq/L (ref 3.5–5.1)
Sodium: 139 meq/L (ref 136–145)
Total Bilirubin: 0.35 mg/dL (ref 0.20–1.20)
Total Protein: 7.9 g/dL (ref 6.4–8.3)

## 2016-07-23 MED ORDER — IOPAMIDOL (ISOVUE-300) INJECTION 61%
INTRAVENOUS | Status: AC
Start: 2016-07-23 — End: 2016-07-23
  Administered 2016-07-23: 100 mL
  Filled 2016-07-23: qty 100

## 2016-07-23 NOTE — Telephone Encounter (Signed)
Lab appt moved to later time, per patient request. 07/23/16

## 2016-07-27 ENCOUNTER — Ambulatory Visit (HOSPITAL_BASED_OUTPATIENT_CLINIC_OR_DEPARTMENT_OTHER): Payer: Medicaid Other | Admitting: Internal Medicine

## 2016-07-27 ENCOUNTER — Encounter: Payer: Self-pay | Admitting: Internal Medicine

## 2016-07-27 ENCOUNTER — Encounter: Payer: Self-pay | Admitting: Radiation Oncology

## 2016-07-27 ENCOUNTER — Ambulatory Visit
Admission: RE | Admit: 2016-07-27 | Discharge: 2016-07-27 | Disposition: A | Discharge status: Home / Self Care | Payer: Medicaid Other | Source: Ambulatory Visit | Attending: Radiation Oncology | Admitting: Radiation Oncology

## 2016-07-27 ENCOUNTER — Other Ambulatory Visit: Payer: Self-pay | Admitting: *Deleted

## 2016-07-27 VITALS — BP 103/83 | HR 93 | Temp 98.1°F | Resp 18 | Ht 68.0 in | Wt 151.2 lb

## 2016-07-27 VITALS — BP 109/83 | HR 102 | Temp 97.7°F | Resp 18 | Ht 68.0 in | Wt 151.0 lb

## 2016-07-27 DIAGNOSIS — G893 Neoplasm related pain (acute) (chronic): Secondary | ICD-10-CM

## 2016-07-27 DIAGNOSIS — J9819 Other pulmonary collapse: Secondary | ICD-10-CM | POA: Insufficient documentation

## 2016-07-27 DIAGNOSIS — C3490 Malignant neoplasm of unspecified part of unspecified bronchus or lung: Secondary | ICD-10-CM | POA: Insufficient documentation

## 2016-07-27 DIAGNOSIS — R0602 Shortness of breath: Secondary | ICD-10-CM | POA: Diagnosis not present

## 2016-07-27 DIAGNOSIS — Z79899 Other long term (current) drug therapy: Secondary | ICD-10-CM | POA: Insufficient documentation

## 2016-07-27 DIAGNOSIS — Z9221 Personal history of antineoplastic chemotherapy: Secondary | ICD-10-CM | POA: Insufficient documentation

## 2016-07-27 DIAGNOSIS — C3492 Malignant neoplasm of unspecified part of left bronchus or lung: Secondary | ICD-10-CM

## 2016-07-27 DIAGNOSIS — R5382 Chronic fatigue, unspecified: Secondary | ICD-10-CM

## 2016-07-27 DIAGNOSIS — Z5112 Encounter for antineoplastic immunotherapy: Secondary | ICD-10-CM

## 2016-07-27 DIAGNOSIS — Z7189 Other specified counseling: Secondary | ICD-10-CM

## 2016-07-27 DIAGNOSIS — R0789 Other chest pain: Secondary | ICD-10-CM | POA: Diagnosis not present

## 2016-07-27 DIAGNOSIS — Z79891 Long term (current) use of opiate analgesic: Secondary | ICD-10-CM | POA: Diagnosis not present

## 2016-07-27 HISTORY — DX: Other specified counseling: Z71.89

## 2016-07-27 HISTORY — DX: Neoplasm related pain (acute) (chronic): G89.3

## 2016-07-27 MED ORDER — OXYCODONE-ACETAMINOPHEN 5-325 MG PO TABS: 1.0000 | ORAL_TABLET | Freq: Four times a day (QID) | ORAL | 0 refills | Status: DC | PRN

## 2016-07-27 MED ORDER — IPRATROPIUM-ALBUTEROL 20-100 MCG/ACT IN AERS: 1.0000 | INHALATION_SPRAY | Freq: Four times a day (QID) | RESPIRATORY_TRACT | 0 refills | Status: DC | PRN

## 2016-07-27 MED FILL — OXYCODONE/APAP 5/325 MG TAB: 5-325 | 10 days supply | Qty: 40 | Fill #0

## 2016-07-27 NOTE — Progress Notes (Signed)
Radiation Oncology         [phone]) [phone] ________________________________  Name: Douglas Kelly MRN: [id number]  Date: 07/27/2016  DOB: 01-17-1971  Post Treatment Note  CC: No PCP Per Patient  Short, Noah Delaine, MD  Diagnosis:   Stage IIIA (T2a, N2, M0) non-small cell lung cancer, squamous cell carcinoma  Interval Since Last Radiation: 4 weeks  04/22/16 - 06/28/16: The primary tumor and involved mediastinal adenopathy were treated to 66 Gy in 33 fractions of 2 Gy.  Narrative:  The patient returns today for routine follow-up. He did experience shortness of breath and pain associated with swallowing initially during treatment but this had resolved by the completion of treatment. He also experienced some mild hyperpigmentation of his skin without desquamation in the treatment field, modest fatigue and a dry cough.                            Recent post-treatment CT C/A/P on 07/23/16 demonstrates complete opacification of the left upper lobe and scattered small right lung nodules measuring up to 5 mm, indeterminate, but possibly new/progressed from recent CT in January 2018. There were no findings suspicious for metastatic disease in the abdomen and pelvis.  On review of systems, the patient states that overall, he has recovered well from the effects of radiotherapy. He continues with pain in the left chest which is relieved with Percocet. He also reports continued shortness of breath and dry cough but this is unchanged recently.  He uses a Combivent inhaler prn but has run out of this medication recently.  He would like to have this refilled. He denies fever, chills, dyspnea, hemoptysis, N/V/D or night sweats. His weight has remained stable and appetite is improved. He had a follow-up appointment with Dr. Julien Nordmann in medical oncology this morning and is scheduled to begin immunotherapy with Durvalumab for consolidation treatment. He anticipates beginning this treatment next week.   ALLERGIES:  has No Known  Allergies.  Meds: Current Outpatient Prescriptions  Medication Sig Dispense Refill  . albuterol-ipratropium (COMBIVENT) 18-103 MCG/ACT inhaler Inhale 1 puff into the lungs every 4 (four) hours as needed for wheezing or shortness of breath. 1 Inhaler 0  . bisacodyl (BISACODYL) 5 MG EC tablet Take 1 tablet (5 mg total) by mouth daily as needed for moderate constipation. 30 tablet 0  . hyaluronate sodium (RADIAPLEXRX) GEL Apply 1 application topically 2 (two) times daily.    . nicotine (NICODERM CQ - DOSED IN MG/24 HOURS) 21 mg/24hr patch Place 1 patch (21 mg total) onto the skin daily. 28 patch 0  . Oxycodone HCl 10 MG TABS   0  . oxyCODONE-acetaminophen (PERCOCET/ROXICET) 5-325 MG tablet Take 1 tablet by mouth every 6 (six) hours as needed for severe pain. 40 tablet 0  . prochlorperazine (COMPAZINE) 10 MG tablet Take 1 tablet (10 mg total) by mouth every 6 (six) hours as needed for nausea or vomiting. 30 tablet 0   No current facility-administered medications for this encounter.     Physical Findings:  vitals were not taken for this visit.  3/10 In general this is a well appearing caucasian male in no acute distress. He's alert and oriented x4 and appropriate throughout the examination. Cardiopulmonary assessment is negative for acute distress and he exhibits normal effort. There are decreased breath sounds in the LUL with wheezes noted bilaterally throughout the lung fields. His skin remains with mild hyperpigmentation on the upper back without desquamation.  Lab Findings: Lab Results  Component Value Date   WBC 5.9 07/23/2016   HGB 14.3 07/23/2016   HCT 41.4 07/23/2016   MCV 92.4 07/23/2016   PLT 229 07/23/2016     Radiographic Findings: Dg Chest 2 View  Result Date: 06/30/2016 CLINICAL DATA:  History of lung carcinoma.  Shortness of breath EXAM: CHEST  2 VIEW COMPARISON:  June 16, 2016 FINDINGS: There is volume loss on the left. There is fibrotic type change in the left  medial mid and lower lung zone regions, likely due to radiation fibrosis. Lungs elsewhere are clear. Heart size is normal. Pulmonary vascularity is stable and within normal limits on the right and mildly distorted on the left, likely due to radiation therapy change. No adenopathy evident. No bone lesions evident. IMPRESSION: Stable volume loss and radiation therapy change on the left. No edema or consolidation. Stable cardiac silhouette. No evident adenopathy. Electronically Signed   By: Lowella Grip III M.D.   On: 06/30/2016 09:49   Ct Chest W Contrast  Result Date: 07/23/2016 CLINICAL DATA:  Stage III lung cancer, diagnosed December 2017, status post chemotherapy/XRT EXAM: CT CHEST, ABDOMEN, AND PELVIS WITH CONTRAST TECHNIQUE: Multidetector CT imaging of the chest, abdomen and pelvis was performed following the standard protocol during bolus administration of intravenous contrast. CONTRAST:  100 ISOVUE-300 IOPAMIDOL (ISOVUE-300) INJECTION 61% COMPARISON:  CT chest dated 05/30/2016.  PET-CT dated 05/11/2016. FINDINGS: CT CHEST FINDINGS Cardiovascular: Heart is normal in size. No pericardial effusion. Leftward cardiomediastinal shift. No evidence of thoracic aortic aneurysm. Mediastinum/Nodes: Small prevascular nodes measuring up to 8 mm short axis, grossly unchanged. 9 mm subcarinal node (series 2/ image 26), grossly unchanged. Lungs/Pleura: Complete opacification of the left upper lobe. Bronchiectasis with peribronchial thickening and increased interstitial markings in the left lower lobe. Trace left pleural effusion, decreased. Mild paraseptal emphysematous changes in the right lung, upper lobe predominant. 3 mm medial right lower lobe pulmonary nodule (series 6/ image 103). 5 mm right lower lobe pulmonary nodule (series 6/ image 116). Multiple additional scattered 3-5 mm pulmonary nodules in the right lung, approximately 10-15 in number. No pneumothorax. Musculoskeletal: No focal osseous lesions. CT  ABDOMEN PELVIS FINDINGS Hepatobiliary: Liver is within normal limits. Gallbladder is unremarkable. No intrahepatic or extrahepatic ductal dilatation. Pancreas: Within normal limits. Spleen: Within normal limits. Adrenals/Urinary Tract: Adrenal glands are within normal limits. Kidneys are within normal limits.  No hydronephrosis. Bladder is within normal limits. Stomach/Bowel: Stomach is within normal limits. No evidence of bowel obstruction. Normal appendix (series 2/image 95). Sigmoid colon is mildly thick-walled although underdistended. Mild sigmoid diverticulosis. Vascular/Lymphatic: No evidence of abdominal aortic aneurysm. No suspicious abdominopelvic lymphadenopathy. Reproductive: Prostate is unremarkable. Other: No abdominopelvic ascites. Musculoskeletal: Very mild degenerative changes of the lumbar spine. IMPRESSION: Complete left lung collapse in this patient with known primary bronchogenic neoplasm. Small prevascular and subcarinal nodes measuring up to 9 mm short axis. Scattered small right lung nodules measuring up to 5 mm, indeterminate, but possibly new/progressed from recent CT. Contralateral pulmonary metastases are not excluded. Attention on follow-up is suggested. No findings suspicious for metastatic disease in the abdomen/pelvis. Electronically Signed   By: Julian Hy M.D.   On: 07/23/2016 14:31   Ct Abdomen Pelvis W Contrast  Result Date: 07/23/2016 CLINICAL DATA:  Stage III lung cancer, diagnosed December 2017, status post chemotherapy/XRT EXAM: CT CHEST, ABDOMEN, AND PELVIS WITH CONTRAST TECHNIQUE: Multidetector CT imaging of the chest, abdomen and pelvis was performed following the standard protocol during bolus administration of  intravenous contrast. CONTRAST:  100 ISOVUE-300 IOPAMIDOL (ISOVUE-300) INJECTION 61% COMPARISON:  CT chest dated 05/30/2016.  PET-CT dated 05/11/2016. FINDINGS: CT CHEST FINDINGS Cardiovascular: Heart is normal in size. No pericardial effusion. Leftward  cardiomediastinal shift. No evidence of thoracic aortic aneurysm. Mediastinum/Nodes: Small prevascular nodes measuring up to 8 mm short axis, grossly unchanged. 9 mm subcarinal node (series 2/ image 26), grossly unchanged. Lungs/Pleura: Complete opacification of the left upper lobe. Bronchiectasis with peribronchial thickening and increased interstitial markings in the left lower lobe. Trace left pleural effusion, decreased. Mild paraseptal emphysematous changes in the right lung, upper lobe predominant. 3 mm medial right lower lobe pulmonary nodule (series 6/ image 103). 5 mm right lower lobe pulmonary nodule (series 6/ image 116). Multiple additional scattered 3-5 mm pulmonary nodules in the right lung, approximately 10-15 in number. No pneumothorax. Musculoskeletal: No focal osseous lesions. CT ABDOMEN PELVIS FINDINGS Hepatobiliary: Liver is within normal limits. Gallbladder is unremarkable. No intrahepatic or extrahepatic ductal dilatation. Pancreas: Within normal limits. Spleen: Within normal limits. Adrenals/Urinary Tract: Adrenal glands are within normal limits. Kidneys are within normal limits.  No hydronephrosis. Bladder is within normal limits. Stomach/Bowel: Stomach is within normal limits. No evidence of bowel obstruction. Normal appendix (series 2/image 95). Sigmoid colon is mildly thick-walled although underdistended. Mild sigmoid diverticulosis. Vascular/Lymphatic: No evidence of abdominal aortic aneurysm. No suspicious abdominopelvic lymphadenopathy. Reproductive: Prostate is unremarkable. Other: No abdominopelvic ascites. Musculoskeletal: Very mild degenerative changes of the lumbar spine. IMPRESSION: Complete left lung collapse in this patient with known primary bronchogenic neoplasm. Small prevascular and subcarinal nodes measuring up to 9 mm short axis. Scattered small right lung nodules measuring up to 5 mm, indeterminate, but possibly new/progressed from recent CT. Contralateral pulmonary  metastases are not excluded. Attention on follow-up is suggested. No findings suspicious for metastatic disease in the abdomen/pelvis. Electronically Signed   By: Julian Hy M.D.   On: 07/23/2016 14:31    Impression/Plan: 1. Stage IIIA (T2a, N2, M0) non-small cell lung cancer, squamous cell carcinoma.   He has completed concurrent chemoradiation and has recovered well from the effects of radiotherapy.  He anticipates beginning immunotherapy next week for consolidation treatment. Fortunately, recent post-treatment CT scans did not show disease progression. He will continue close monitoring of his disease with Dr. Julien Nordmann and we will follow along via correspondence. We are happy to further participate in his care and will plan to see him as needed. He knows to contact us with any questions or concerns related to his previous radiotherapy. 2. Left lung collapse.  He has persistent left upper lung collapse as evidenced on his recent CT scan however, no evidence of recurrent pneumothorax. Patient is currently stable and without complaints regarding shortness of breath or dyspnea. I will send a copy of my note to Dr. Cyndia Bent who has assisted with management of this patient previously, to keep him in the loop in case the patient were to develop worsening symptoms in the future requiring repeat chest tube placement.    Nicholos Johns, PA-C

## 2016-07-27 NOTE — Progress Notes (Signed)
[facility] [phone]   [phone]  OFFICE PROGRESS NOTE  No PCP Per Patient No address on file  DIAGNOSIS: Stage IIIA (T2a, N2, M0) non-small cell lung cancer, squamous cell carcinoma diagnosed in December 2017 with almost complete collapse of the left lung.  PRIOR THERAPY: Concurrent chemoradiation with weekly carboplatin for AUC of 2 and paclitaxel 45 MG/M2. The patient is status post 6 weeks of radiation but only 2 cycles of chemotherapy because he missed several appointments.  CURRENT THERAPY: Immunotherapy with Imfinzi (Durvalumab) 10 MG/KG every 2 weeks, first dose 08/04/2016.  INTERVAL HISTORY: Douglas Kelly 46 y.o. male came to the clinic today for follow-up visit. The patient completed a course of concurrent chemoradiation and has been observation for the last few weeks. He missed most of his chemotherapy treatment during the course of radiation. He is feeling fine except for the pain on the left side of the chest. He is requesting refill of his pain medication. He also has shortness of breath with exertion with mild cough and no hemoptysis. He denied having any fever or chills. He has no nausea, vomiting, diarrhea or constipation. He denied having any significant weight loss or night sweats. He had repeat CT scan of the chest, abdomen and pelvis performed recently and he is here for evaluation and discussion of his scan results.  MEDICAL HISTORY: Past Medical History:  Diagnosis Date  . Encounter for antineoplastic chemotherapy 05/14/2016  . Pneumonia     ALLERGIES:  has No Known Allergies.  MEDICATIONS:  Current Outpatient Prescriptions  Medication Sig Dispense Refill  . albuterol-ipratropium (COMBIVENT) 18-103 MCG/ACT inhaler Inhale 1 puff into the lungs every 4 (four) hours as needed for wheezing or shortness of breath. 1 Inhaler 0  . bisacodyl (BISACODYL) 5 MG EC tablet Take 1 tablet (5 mg total) by mouth daily as needed for  moderate constipation. 30 tablet 0  . hyaluronate sodium (RADIAPLEXRX) GEL Apply 1 application topically 2 (two) times daily.    . nicotine (NICODERM CQ - DOSED IN MG/24 HOURS) 21 mg/24hr patch Place 1 patch (21 mg total) onto the skin daily. 28 patch 0  . Oxycodone HCl 10 MG TABS Take 1 tablet (10 mg total) by mouth every 6 (six) hours as needed. 120 tablet 0  . prochlorperazine (COMPAZINE) 10 MG tablet Take 1 tablet (10 mg total) by mouth every 6 (six) hours as needed for nausea or vomiting. 30 tablet 0   No current facility-administered medications for this visit.     SURGICAL HISTORY:  Past Surgical History:  Procedure Laterality Date  . VIDEO BRONCHOSCOPY Bilateral 04/19/2016   Procedure: VIDEO BRONCHOSCOPY WITHOUT FLUORO;  Surgeon: Juanito Doom, MD;  Location: [facility];  Service: Cardiopulmonary;  Laterality: Bilateral;    REVIEW OF SYSTEMS:  Constitutional: positive for fatigue Eyes: negative Ears, nose, mouth, throat, and face: negative Respiratory: positive for cough, dyspnea on exertion and pleurisy/chest pain Cardiovascular: negative Gastrointestinal: negative Genitourinary:negative Integument/breast: negative Hematologic/lymphatic: negative Musculoskeletal:negative Neurological: negative Behavioral/Psych: negative Endocrine: negative Allergic/Immunologic: negative   PHYSICAL EXAMINATION: General appearance: alert, cooperative, fatigued and no distress Head: Normocephalic, without obvious abnormality, atraumatic Neck: no adenopathy, no JVD, supple, symmetrical, trachea midline and thyroid not enlarged, symmetric, no tenderness/mass/nodules Lymph nodes: Cervical, supraclavicular, and axillary nodes normal. Resp: diminished breath sounds LUL, dullness to percussion LUL and wheezes bilaterally Back: symmetric, no curvature. ROM normal. No CVA tenderness. Cardio: regular rate and rhythm, S1, S2 normal, no murmur, click, rub  or gallop GI: soft, non-tender; bowel  sounds normal; no masses,  no organomegaly Extremities: extremities normal, atraumatic, no cyanosis or edema Neurologic: Alert and oriented X 3, normal strength and tone. Normal symmetric reflexes. Normal coordination and gait  ECOG PERFORMANCE STATUS: 1 - Symptomatic but completely ambulatory  There were no vitals taken for this visit.  LABORATORY DATA: Lab Results  Component Value Date   WBC 5.9 07/23/2016   HGB 14.3 07/23/2016   HCT 41.4 07/23/2016   MCV 92.4 07/23/2016   PLT 229 07/23/2016      Chemistry      Component Value Date/Time   NA 139 07/23/2016 1041   K 4.9 07/23/2016 1041   CL 103 05/22/2016 1530   CO2 27 07/23/2016 1041   BUN 12.1 07/23/2016 1041   CREATININE 0.9 07/23/2016 1041      Component Value Date/Time   CALCIUM 10.1 07/23/2016 1041   ALKPHOS 117 07/23/2016 1041   AST 16 07/23/2016 1041   ALT 30 07/23/2016 1041   BILITOT 0.35 07/23/2016 1041       RADIOGRAPHIC STUDIES: Dg Chest 2 View  Result Date: 06/30/2016 CLINICAL DATA:  History of lung carcinoma.  Shortness of breath EXAM: CHEST  2 VIEW COMPARISON:  June 16, 2016 FINDINGS: There is volume loss on the left. There is fibrotic type change in the left medial mid and lower lung zone regions, likely due to radiation fibrosis. Lungs elsewhere are clear. Heart size is normal. Pulmonary vascularity is stable and within normal limits on the right and mildly distorted on the left, likely due to radiation therapy change. No adenopathy evident. No bone lesions evident. IMPRESSION: Stable volume loss and radiation therapy change on the left. No edema or consolidation. Stable cardiac silhouette. No evident adenopathy. Electronically Signed   By: Lowella Grip III M.D.   On: 06/30/2016 09:49   Ct Chest W Contrast  Result Date: 07/23/2016 CLINICAL DATA:  Stage III lung cancer, diagnosed December 2017, status post chemotherapy/XRT EXAM: CT CHEST, ABDOMEN, AND PELVIS WITH CONTRAST TECHNIQUE: Multidetector  CT imaging of the chest, abdomen and pelvis was performed following the standard protocol during bolus administration of intravenous contrast. CONTRAST:  100 ISOVUE-300 IOPAMIDOL (ISOVUE-300) INJECTION 61% COMPARISON:  CT chest dated 05/30/2016.  PET-CT dated 05/11/2016. FINDINGS: CT CHEST FINDINGS Cardiovascular: Heart is normal in size. No pericardial effusion. Leftward cardiomediastinal shift. No evidence of thoracic aortic aneurysm. Mediastinum/Nodes: Small prevascular nodes measuring up to 8 mm short axis, grossly unchanged. 9 mm subcarinal node (series 2/ image 26), grossly unchanged. Lungs/Pleura: Complete opacification of the left upper lobe. Bronchiectasis with peribronchial thickening and increased interstitial markings in the left lower lobe. Trace left pleural effusion, decreased. Mild paraseptal emphysematous changes in the right lung, upper lobe predominant. 3 mm medial right lower lobe pulmonary nodule (series 6/ image 103). 5 mm right lower lobe pulmonary nodule (series 6/ image 116). Multiple additional scattered 3-5 mm pulmonary nodules in the right lung, approximately 10-15 in number. No pneumothorax. Musculoskeletal: No focal osseous lesions. CT ABDOMEN PELVIS FINDINGS Hepatobiliary: Liver is within normal limits. Gallbladder is unremarkable. No intrahepatic or extrahepatic ductal dilatation. Pancreas: Within normal limits. Spleen: Within normal limits. Adrenals/Urinary Tract: Adrenal glands are within normal limits. Kidneys are within normal limits.  No hydronephrosis. Bladder is within normal limits. Stomach/Bowel: Stomach is within normal limits. No evidence of bowel obstruction. Normal appendix (series 2/image 95). Sigmoid colon is mildly thick-walled although underdistended. Mild sigmoid diverticulosis. Vascular/Lymphatic: No evidence of abdominal aortic aneurysm. No  suspicious abdominopelvic lymphadenopathy. Reproductive: Prostate is unremarkable. Other: No abdominopelvic ascites.  Musculoskeletal: Very mild degenerative changes of the lumbar spine. IMPRESSION: Complete left lung collapse in this patient with known primary bronchogenic neoplasm. Small prevascular and subcarinal nodes measuring up to 9 mm short axis. Scattered small right lung nodules measuring up to 5 mm, indeterminate, but possibly new/progressed from recent CT. Contralateral pulmonary metastases are not excluded. Attention on follow-up is suggested. No findings suspicious for metastatic disease in the abdomen/pelvis. Electronically Signed   By: Julian Hy M.D.   On: 07/23/2016 14:31   Ct Abdomen Pelvis W Contrast  Result Date: 07/23/2016 CLINICAL DATA:  Stage III lung cancer, diagnosed December 2017, status post chemotherapy/XRT EXAM: CT CHEST, ABDOMEN, AND PELVIS WITH CONTRAST TECHNIQUE: Multidetector CT imaging of the chest, abdomen and pelvis was performed following the standard protocol during bolus administration of intravenous contrast. CONTRAST:  100 ISOVUE-300 IOPAMIDOL (ISOVUE-300) INJECTION 61% COMPARISON:  CT chest dated 05/30/2016.  PET-CT dated 05/11/2016. FINDINGS: CT CHEST FINDINGS Cardiovascular: Heart is normal in size. No pericardial effusion. Leftward cardiomediastinal shift. No evidence of thoracic aortic aneurysm. Mediastinum/Nodes: Small prevascular nodes measuring up to 8 mm short axis, grossly unchanged. 9 mm subcarinal node (series 2/ image 26), grossly unchanged. Lungs/Pleura: Complete opacification of the left upper lobe. Bronchiectasis with peribronchial thickening and increased interstitial markings in the left lower lobe. Trace left pleural effusion, decreased. Mild paraseptal emphysematous changes in the right lung, upper lobe predominant. 3 mm medial right lower lobe pulmonary nodule (series 6/ image 103). 5 mm right lower lobe pulmonary nodule (series 6/ image 116). Multiple additional scattered 3-5 mm pulmonary nodules in the right lung, approximately 10-15 in number. No  pneumothorax. Musculoskeletal: No focal osseous lesions. CT ABDOMEN PELVIS FINDINGS Hepatobiliary: Liver is within normal limits. Gallbladder is unremarkable. No intrahepatic or extrahepatic ductal dilatation. Pancreas: Within normal limits. Spleen: Within normal limits. Adrenals/Urinary Tract: Adrenal glands are within normal limits. Kidneys are within normal limits.  No hydronephrosis. Bladder is within normal limits. Stomach/Bowel: Stomach is within normal limits. No evidence of bowel obstruction. Normal appendix (series 2/image 95). Sigmoid colon is mildly thick-walled although underdistended. Mild sigmoid diverticulosis. Vascular/Lymphatic: No evidence of abdominal aortic aneurysm. No suspicious abdominopelvic lymphadenopathy. Reproductive: Prostate is unremarkable. Other: No abdominopelvic ascites. Musculoskeletal: Very mild degenerative changes of the lumbar spine. IMPRESSION: Complete left lung collapse in this patient with known primary bronchogenic neoplasm. Small prevascular and subcarinal nodes measuring up to 9 mm short axis. Scattered small right lung nodules measuring up to 5 mm, indeterminate, but possibly new/progressed from recent CT. Contralateral pulmonary metastases are not excluded. Attention on follow-up is suggested. No findings suspicious for metastatic disease in the abdomen/pelvis. Electronically Signed   By: Julian Hy M.D.   On: 07/23/2016 14:31    ASSESSMENT AND PLAN:  This is a very pleasant 46 years old white male with a stage IIIa non-small cell lung cancer, squamous cell carcinoma that was diagnosed in December 2017 with central obstructing left lung mass as well as mediastinal lymphadenopathy. He underwent a course of concurrent chemoradiation with weekly carboplatin and paclitaxel but he missed several of his chemotherapy appointment. He is feeling fine today except for pain on the left side of the chest. He had a recent CT scan of the chest, abdomen and pelvis for  restaging of his disease. I personally and independently reviewed the scan images and discuss the results and showed the images to the patient today. His scan showed persistent complete left lung  collapse but no concerning finding for disease progression. I discussed with the patient treatment options including close monitoring versus treatment with immunotherapy with Imfinzi (Durvalumab) 10 MG/KG every 2 weeks as the consolidation treatment. The patient is interested in the treatment with immunotherapy. I discussed with them the adverse effect of this treatment including but not limited to immune mediated the skin rash, diarrhea, inflammation of the lung, liver, kidney as well as thyroid or other endocrine dysfunction. He is expected to start the first dose of this treatment next week. I will see him back for follow-up visit in 3 weeks for evaluation before starting cycle #2. For pain management I gave the patient prescription for Percocet 5/325 mg by mouth every 6 hours as needed for pain. The patient was also advised to use ibuprofen if needed He was also advised to call immediately if he has any concerning symptoms in the interval. The patient voices understanding of current disease status and treatment options and is in agreement with the current care plan.  All questions were answered. The patient knows to call the clinic with any problems, questions or concerns. We can certainly see the patient much sooner if necessary.   Disclaimer: This note was dictated with voice recognition software. Similar sounding words can inadvertently be transcribed and may not be corrected upon review.

## 2016-07-27 NOTE — Progress Notes (Signed)
DISCONTINUE ON PATHWAY REGIMEN - Non-Small Cell Lung     Administer weekly:     Paclitaxel        Dose Mod: None     Carboplatin        Dose Mod: None  **Always confirm dose/schedule in your pharmacy ordering system**    REASON: Continuation Of Treatment PRIOR TREATMENT: [id number]: Carboplatin AUC=2 + Paclitaxel 45 mg/m2 Weekly During Radiation TREATMENT RESPONSE: Stable Disease (SD)  START ON PATHWAY REGIMEN - Non-Small Cell Lung     A cycle is every 14 days:     Durvalumab   **Always confirm dose/schedule in your pharmacy ordering system**    Patient Characteristics: Stage III - Unresectable, PS = 0, 1 AJCC T Category: T2a Current Disease Status: No Distant Mets or Local Recurrence AJCC N Category: N2 AJCC M Category: M0 AJCC 8 Stage Grouping: IIIA Performance Status: PS = 0, 1  Intent of Therapy: Curative Intent, Discussed with Patient

## 2016-07-29 ENCOUNTER — Telehealth: Payer: Self-pay | Admitting: Medical Oncology

## 2016-07-29 ENCOUNTER — Other Ambulatory Visit: Payer: Self-pay | Admitting: Medical Oncology

## 2016-07-29 NOTE — Telephone Encounter (Signed)
Called to open case to get auth for ct abd only (  CT abd/pelvis was done as stat , but denied) . Was on hold more than 10 minutes so hung up.

## 2016-07-30 ENCOUNTER — Telehealth: Payer: Self-pay | Admitting: Internal Medicine

## 2016-07-30 ENCOUNTER — Telehealth: Payer: Self-pay | Admitting: Medical Oncology

## 2016-07-30 NOTE — Telephone Encounter (Signed)
Pt left message that his  chemo has not been scheduled. I resent schedule request to start 3/28.

## 2016-07-30 NOTE — Telephone Encounter (Signed)
Scheduled appts per sch msg from Rn Dine and 3/20 los. Patient is aware of new time and date of 3/30 appt and will pick up new schedule when he comes in .

## 2016-08-06 ENCOUNTER — Other Ambulatory Visit (HOSPITAL_BASED_OUTPATIENT_CLINIC_OR_DEPARTMENT_OTHER): Payer: Medicaid Other

## 2016-08-06 ENCOUNTER — Ambulatory Visit (HOSPITAL_BASED_OUTPATIENT_CLINIC_OR_DEPARTMENT_OTHER): Payer: Medicaid Other

## 2016-08-06 VITALS — BP 132/68 | HR 94 | Temp 97.7°F | Resp 16

## 2016-08-06 DIAGNOSIS — Z5112 Encounter for antineoplastic immunotherapy: Secondary | ICD-10-CM | POA: Diagnosis not present

## 2016-08-06 DIAGNOSIS — C3492 Malignant neoplasm of unspecified part of left bronchus or lung: Secondary | ICD-10-CM

## 2016-08-06 DIAGNOSIS — R5382 Chronic fatigue, unspecified: Secondary | ICD-10-CM | POA: Diagnosis not present

## 2016-08-06 LAB — COMPREHENSIVE METABOLIC PANEL
ALT: 21 U/L (ref 0–55)
AST: 15 U/L (ref 5–34)
Albumin: 3.6 g/dL (ref 3.5–5.0)
Alkaline Phosphatase: 105 U/L (ref 40–150)
Anion Gap: 11 mEq/L (ref 3–11)
BUN: 15 mg/dL (ref 7.0–26.0)
CHLORIDE: 105 meq/L (ref 98–109)
CO2: 21 meq/L — AB (ref 22–29)
CREATININE: 0.9 mg/dL (ref 0.7–1.3)
Calcium: 9.5 mg/dL (ref 8.4–10.4)
EGFR: >90 mL/min/{1.73_m2} (ref >90)
Glucose: 176 mg/dl — ABNORMAL HIGH (ref 70–140)
Potassium: 3.9 mEq/L (ref 3.5–5.1)
Sodium: 137 mEq/L (ref 136–145)
Total Bilirubin: <0.22 mg/dL (ref 0.20–1.20)
Total Protein: 7.4 g/dL (ref 6.4–8.3)

## 2016-08-06 LAB — CBC WITH DIFFERENTIAL/PLATELET
BASO%: 1.1 % (ref 0.0–2.0)
Basophils Absolute: 0.1 10*3/uL (ref 0.0–0.1)
EOS%: 3 % (ref 0.0–7.0)
Eosinophils Absolute: 0.2 10*3/uL (ref 0.0–0.5)
HEMATOCRIT: 37.9 % — AB (ref 38.4–49.9)
HGB: 13.1 g/dL (ref 13.0–17.1)
LYMPH#: 0.4 10*3/uL — AB (ref 0.9–3.3)
LYMPH%: 6 % — AB (ref 14.0–49.0)
MCH: 32.5 pg (ref 27.2–33.4)
MCHC: 34.6 g/dL (ref 32.0–36.0)
MCV: 94 fL (ref 79.3–98.0)
MONO#: 0.5 10*3/uL (ref 0.1–0.9)
MONO%: 7 % (ref 0.0–14.0)
NEUT#: 5.6 10*3/uL (ref 1.5–6.5)
NEUT%: 82.9 % — ABNORMAL HIGH (ref 39.0–75.0)
Platelets: 294 10*3/uL (ref 140–400)
RBC: 4.03 10*6/uL — AB (ref 4.20–5.82)
RDW: 14.5 % (ref 11.0–14.6)
WBC: 6.7 10*3/uL (ref 4.0–10.3)

## 2016-08-06 LAB — TSH: TSH: 1.197 m(IU)/L (ref 0.320–4.118)

## 2016-08-06 MED ORDER — SODIUM CHLORIDE 0.9 % IV SOLN
9.1000 mg/kg | Freq: Once | INTRAVENOUS | Status: AC
  Administered 2016-08-06: 620 mg via INTRAVENOUS
  Filled 2016-08-06: qty 10

## 2016-08-06 MED ORDER — SODIUM CHLORIDE 0.9 % IV SOLN
Freq: Once | INTRAVENOUS | Status: AC
  Administered 2016-08-06: 15:00:00 via INTRAVENOUS

## 2016-08-06 NOTE — Patient Instructions (Signed)
Durvalumab injection  What is this medicine?  DURVALUMAB (dur VAL ue mab) is a monoclonal antibody. It is used to treat urothelial cancer.  This medicine may be used for other purposes; ask your health care provider or pharmacist if you have questions.  COMMON BRAND NAME(S): IMFINZI  What should I tell my health care provider before I take this medicine?  They need to know if you have any of these conditions:  -diabetes  -immune system problems  -infection  -inflammatory bowel disease  -kidney disease  -liver disease  -lung or breathing disease  -lupus  -organ transplant  -stomach or intestine problems  -thyroid disease  -an unusual or allergic reaction to durvalumab, other medicines, foods, dyes, or preservatives  -pregnant or trying to get pregnant  -breast-feeding  How should I use this medicine?  This medicine is for infusion into a vein. It is given by a health care professional in a hospital or clinic setting.  A special MedGuide will be given to you before each treatment. Be sure to read this information carefully each time.  Talk to your pediatrician regarding the use of this medicine in children. Special care may be needed.  Overdosage: If you think you have taken too much of this medicine contact a poison control center or emergency room at once.  NOTE: This medicine is only for you. Do not share this medicine with others.  What if I miss a dose?  It is important not to miss your dose. Call your doctor or health care professional if you are unable to keep an appointment.  What may interact with this medicine?  Interactions have not been studied.  This list may not describe all possible interactions. Give your health care provider a list of all the medicines, herbs, non-prescription drugs, or dietary supplements you use. Also tell them if you smoke, drink alcohol, or use illegal drugs. Some items may interact with your medicine.  What should I watch for while using this medicine?  This drug may make you  feel generally unwell. Continue your course of treatment even though you feel ill unless your doctor tells you to stop.  You may need blood work done while you are taking this medicine.  Do not become pregnant while taking this medicine or for 3 months after stopping it. Women should inform their doctor if they wish to become pregnant or think they might be pregnant. There is a potential for serious side effects to an unborn child. Talk to your health care professional or pharmacist for more information. Do not breast-feed an infant while taking this medicine or for 3 months after stopping it.  What side effects may I notice from receiving this medicine?  Side effects that you should report to your doctor or health care professional as soon as possible:  -allergic reactions like skin rash, itching or hives, swelling of the face, lips, or tongue  -black, tarry stools  -bloody or watery diarrhea  -breathing problems  -change in emotions or moods  -change in sex drive  -changes in vision  -chest pain or chest tightness  -chills  -confusion  -cough  -facial flushing  -fever  -headache  -signs and symptoms of high blood sugar such as dizziness; dry mouth; dry skin; fruity breath; nausea; stomach pain; increased hunger or thirst; increased urination  -signs and symptoms of liver injury like dark yellow or brown urine; general ill feeling or flu-like symptoms; light-colored stools; loss of appetite; nausea; right upper belly pain;   unusually weak or tired; yellowing of the eyes or skin  -stomach pain  -trouble passing urine or change in the amount of urine  -weight gain or weight loss  Side effects that usually do not require medical attention (report these to your doctor or health care professional if they continue or are bothersome):  -bone pain  -constipation  -loss of appetite  -muscle pain  -nausea  -swelling of the ankles, feet, hands  -tiredness  This list may not describe all possible side effects. Call your doctor  for medical advice about side effects. You may report side effects to FDA at 1-800-FDA-1088.  Where should I keep my medicine?  This drug is given in a hospital or clinic and will not be stored at home.  NOTE: This sheet is a summary. It may not cover all possible information. If you have questions about this medicine, talk to your doctor, pharmacist, or health care provider.  © 2018 [employer] (2015-11-28 15:50:36)

## 2016-08-18 ENCOUNTER — Other Ambulatory Visit (HOSPITAL_BASED_OUTPATIENT_CLINIC_OR_DEPARTMENT_OTHER): Payer: Medicaid Other

## 2016-08-18 ENCOUNTER — Telehealth: Payer: Self-pay | Admitting: Internal Medicine

## 2016-08-18 ENCOUNTER — Ambulatory Visit (HOSPITAL_BASED_OUTPATIENT_CLINIC_OR_DEPARTMENT_OTHER): Payer: Medicaid Other

## 2016-08-18 ENCOUNTER — Other Ambulatory Visit (HOSPITAL_COMMUNITY)
Admission: RE | Admit: 2016-08-18 | Discharge: 2016-08-18 | Disposition: A | Discharge status: Home / Self Care | Payer: Medicaid Other | Source: Ambulatory Visit | Attending: Internal Medicine | Admitting: Internal Medicine

## 2016-08-18 ENCOUNTER — Ambulatory Visit (HOSPITAL_BASED_OUTPATIENT_CLINIC_OR_DEPARTMENT_OTHER): Payer: Medicaid Other | Admitting: Internal Medicine

## 2016-08-18 ENCOUNTER — Encounter: Payer: Self-pay | Admitting: Internal Medicine

## 2016-08-18 VITALS — BP 122/73 | HR 91 | Temp 97.9°F | Resp 18 | Ht 68.0 in | Wt 151.2 lb

## 2016-08-18 DIAGNOSIS — G893 Neoplasm related pain (acute) (chronic): Secondary | ICD-10-CM

## 2016-08-18 DIAGNOSIS — R5382 Chronic fatigue, unspecified: Secondary | ICD-10-CM

## 2016-08-18 DIAGNOSIS — Z5112 Encounter for antineoplastic immunotherapy: Secondary | ICD-10-CM

## 2016-08-18 DIAGNOSIS — C3492 Malignant neoplasm of unspecified part of left bronchus or lung: Secondary | ICD-10-CM | POA: Diagnosis present

## 2016-08-18 LAB — CBC WITH DIFFERENTIAL/PLATELET
BASO%: 1.2 % (ref 0.0–2.0)
BASOS ABS: 0.1 10*3/uL (ref 0.0–0.1)
EOS ABS: 0.4 10*3/uL (ref 0.0–0.5)
EOS%: 7.2 % — AB (ref 0.0–7.0)
HEMATOCRIT: 40.9 % (ref 38.4–49.9)
HEMOGLOBIN: 14.2 g/dL (ref 13.0–17.1)
LYMPH#: 0.4 10*3/uL — AB (ref 0.9–3.3)
LYMPH%: 7.3 % — ABNORMAL LOW (ref 14.0–49.0)
MCH: 33.1 pg (ref 27.2–33.4)
MCHC: 34.8 g/dL (ref 32.0–36.0)
MCV: 94.9 fL (ref 79.3–98.0)
MONO#: 0.6 10*3/uL (ref 0.1–0.9)
MONO%: 9.8 % (ref 0.0–14.0)
NEUT#: 4.6 10*3/uL (ref 1.5–6.5)
NEUT%: 74.5 % (ref 39.0–75.0)
Platelets: 332 10*3/uL (ref 140–400)
RBC: 4.31 10*6/uL (ref 4.20–5.82)
RDW: 14.1 % (ref 11.0–14.6)
WBC: 6.2 10*3/uL (ref 4.0–10.3)

## 2016-08-18 LAB — COMPREHENSIVE METABOLIC PANEL
ALBUMIN: 4 g/dL (ref 3.5–5.0)
ALT: 22 U/L (ref 17–63)
AST: 18 U/L (ref 15–41)
Alkaline Phosphatase: 96 U/L (ref 38–126)
Anion gap: 7 (ref 5–15)
BUN: 18 mg/dL (ref 6–20)
CHLORIDE: 104 mmol/L (ref 101–111)
CO2: 26 mmol/L (ref 22–32)
CREATININE: 1.03 mg/dL (ref 0.61–1.24)
Calcium: 9.4 mg/dL (ref 8.9–10.3)
GFR calc Af Amer: >60 mL/min (ref >60)
GFR calc non Af Amer: >60 mL/min (ref >60)
Glucose, Bld: 153 mg/dL — ABNORMAL HIGH (ref 65–99)
POTASSIUM: 4.7 mmol/L (ref 3.5–5.1)
SODIUM: 137 mmol/L (ref 135–145)
Total Bilirubin: 0.4 mg/dL (ref 0.3–1.2)
Total Protein: 8.1 g/dL (ref 6.5–8.1)

## 2016-08-18 MED ORDER — SODIUM CHLORIDE 0.9 % IV SOLN
Freq: Once | INTRAVENOUS | Status: AC
  Administered 2016-08-18: 13:00:00 via INTRAVENOUS

## 2016-08-18 MED ORDER — PROCHLORPERAZINE MALEATE 10 MG PO TABS: 10.0000 mg | ORAL_TABLET | Freq: Four times a day (QID) | ORAL | 0 refills | Status: DC | PRN

## 2016-08-18 MED ORDER — SODIUM CHLORIDE 0.9 % IV SOLN
9.1000 mg/kg | Freq: Once | INTRAVENOUS | Status: AC
  Administered 2016-08-18: 620 mg via INTRAVENOUS
  Filled 2016-08-18: qty 10

## 2016-08-18 MED ORDER — TRAMADOL HCL 50 MG PO TABS: 50.0000 mg | ORAL_TABLET | Freq: Two times a day (BID) | ORAL | 0 refills | Status: DC | PRN

## 2016-08-18 MED FILL — traMADol HCL 50 MG TABS: 50 | 30 days supply | Qty: 60 | Fill #0

## 2016-08-18 NOTE — Patient Instructions (Signed)
[facility] Discharge Instructions for Patients Receiving Chemotherapy  Today you received the following chemotherapy agents: Duralumab   To help prevent nausea and vomiting after your treatment, we encourage you to take your nausea medication as directed.    If you develop nausea and vomiting that is not controlled by your nausea medication, call the clinic.   BELOW ARE SYMPTOMS THAT SHOULD BE REPORTED IMMEDIATELY:  *FEVER GREATER THAN 100.5 F  *CHILLS WITH OR WITHOUT FEVER  NAUSEA AND VOMITING THAT IS NOT CONTROLLED WITH YOUR NAUSEA MEDICATION  *UNUSUAL SHORTNESS OF BREATH  *UNUSUAL BRUISING OR BLEEDING  TENDERNESS IN MOUTH AND THROAT WITH OR WITHOUT PRESENCE OF ULCERS  *URINARY PROBLEMS  *BOWEL PROBLEMS  UNUSUAL RASH Items with * indicate a potential emergency and should be followed up as soon as possible.  Feel free to call the clinic you have any questions or concerns. The clinic phone number is [phone].  Please show the [facility] at check-in to the Emergency Department and triage nurse.

## 2016-08-18 NOTE — Telephone Encounter (Signed)
Appointments scheduled per 08/18/16 los. ( 2 additional lab, MD & Chemo appts added for MAY)  Patient was given a copy of the AVS report and appointment schedule, per 08/18/16 los.

## 2016-08-18 NOTE — Progress Notes (Signed)
[place] Telephone:([phone]) [phone]   Fax:(336) [phone]  OFFICE PROGRESS NOTE  No PCP Per Patient No address on file  DIAGNOSIS: Stage IIIA (T2a, N2, M0) non-small cell lung cancer, squamous cell carcinoma diagnosed in December 2017 with almost complete collapse of the left lung.  PRIOR THERAPY: Concurrent chemoradiation with weekly carboplatin for AUC of 2 and paclitaxel 45 MG/M2. The patient is status post 6 weeks of radiation but only 2 cycles of chemotherapy because he missed several appointments.  CURRENT THERAPY: Immunotherapy with Imfinzi (Durvalumab) 10 MG/KG every 2 weeks, first dose 08/04/2016. Status post one cycle.  INTERVAL HISTORY: Douglas Kelly 46 y.o. male returns to the clinic today for follow-up visit accompanied by his girlfriend. The patient is feeling fine today with no specific complaints except for intermittent pain on the left and right side of the chest. He mentioned that he does not take any pain medication right now. He denied having any weight loss or night sweats. He has no nausea, vomiting, diarrhea or constipation. He tolerated the first cycle of his treatment with Imfinzi (Durvalumab) fairly well. He is here today for evaluation before starting cycle #2.  MEDICAL HISTORY: Past Medical History:  Diagnosis Date  . 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 Encounter for antineoplastic chemotherapy 05/14/2016  . Goals of care, counseling/discussion 07/27/2016  . Pneumonia     ALLERGIES:  has No Known Allergies.  MEDICATIONS:  Current Outpatient Prescriptions  Medication Sig Dispense Refill  . bisacodyl (BISACODYL) 5 MG EC tablet Take 1 tablet (5 mg total) by mouth daily as needed for moderate constipation. (Patient not taking: Reported on 07/27/2016) 30 tablet 0  . Ipratropium-Albuterol (COMBIVENT) 20-100 MCG/ACT AERS respimat Inhale 1 puff into the lungs every 6 (six) hours as needed for wheezing or shortness of breath. 1 Inhaler 0  . nicotine  (NICODERM CQ - DOSED IN MG/24 HOURS) 21 mg/24hr patch Place 1 patch (21 mg total) onto the skin daily. 28 patch 0  . Oxycodone HCl 10 MG TABS   0  . oxyCODONE-acetaminophen (PERCOCET/ROXICET) 5-325 MG tablet Take 1 tablet by mouth every 6 (six) hours as needed for severe pain. 40 tablet 0  . prochlorperazine (COMPAZINE) 10 MG tablet Take 1 tablet (10 mg total) by mouth every 6 (six) hours as needed for nausea or vomiting. (Patient not taking: Reported on 07/27/2016) 30 tablet 0   No current facility-administered medications for this visit.     SURGICAL HISTORY:  Past Surgical History:  Procedure Laterality Date  . VIDEO BRONCHOSCOPY Bilateral 04/19/2016   Procedure: VIDEO BRONCHOSCOPY WITHOUT FLUORO;  Surgeon: Juanito Doom, MD;  Location: [facility];  Service: Cardiopulmonary;  Laterality: Bilateral;    REVIEW OF SYSTEMS:  A comprehensive review of systems was negative except for: Constitutional: positive for fatigue Respiratory: positive for pleurisy/chest pain   PHYSICAL EXAMINATION: General appearance: alert, cooperative, fatigued and no distress Head: Normocephalic, without obvious abnormality, atraumatic Neck: no adenopathy, no JVD, supple, symmetrical, trachea midline and thyroid not enlarged, symmetric, no tenderness/mass/nodules Lymph nodes: Cervical, supraclavicular, and axillary nodes normal. Resp: diminished breath sounds LUL and dullness to percussion LUL Back: symmetric, no curvature. ROM normal. No CVA tenderness. Cardio: regular rate and rhythm, S1, S2 normal, no murmur, click, rub or gallop GI: soft, non-tender; bowel sounds normal; no masses,  no organomegaly Extremities: extremities normal, atraumatic, no cyanosis or edema  ECOG PERFORMANCE STATUS: 1 - Symptomatic but completely ambulatory  Blood pressure 122/73, pulse 91, temperature  97.9 F (36.6 C), temperature source Oral, resp. rate 18, height '5\' 8"'$  (1.727 m), weight 151 lb 3.2 oz (68.6 kg), SpO2 98  %.  LABORATORY DATA: Lab Results  Component Value Date   WBC 6.7 08/06/2016   HGB 13.1 08/06/2016   HCT 37.9 (L) 08/06/2016   MCV 94.0 08/06/2016   PLT 294 08/06/2016      Chemistry      Component Value Date/Time   NA 137 08/06/2016 1407   K 3.9 08/06/2016 1407   CL 103 05/22/2016 1530   CO2 21 (L) 08/06/2016 1407   BUN 15.0 08/06/2016 1407   CREATININE 0.9 08/06/2016 1407      Component Value Date/Time   CALCIUM 9.5 08/06/2016 1407   ALKPHOS 105 08/06/2016 1407   AST 15 08/06/2016 1407   ALT 21 08/06/2016 1407   BILITOT <0.22 08/06/2016 1407       RADIOGRAPHIC STUDIES: Ct Chest W Contrast  Result Date: 07/23/2016 CLINICAL DATA:  Stage III lung cancer, diagnosed December 2017, status post chemotherapy/XRT EXAM: CT CHEST, ABDOMEN, AND PELVIS WITH CONTRAST TECHNIQUE: Multidetector CT imaging of the chest, abdomen and pelvis was performed following the standard protocol during bolus administration of intravenous contrast. CONTRAST:  100 ISOVUE-300 IOPAMIDOL (ISOVUE-300) INJECTION 61% COMPARISON:  CT chest dated 05/30/2016.  PET-CT dated 05/11/2016. FINDINGS: CT CHEST FINDINGS Cardiovascular: Heart is normal in size. No pericardial effusion. Leftward cardiomediastinal shift. No evidence of thoracic aortic aneurysm. Mediastinum/Nodes: Small prevascular nodes measuring up to 8 mm short axis, grossly unchanged. 9 mm subcarinal node (series 2/ image 26), grossly unchanged. Lungs/Pleura: Complete opacification of the left upper lobe. Bronchiectasis with peribronchial thickening and increased interstitial markings in the left lower lobe. Trace left pleural effusion, decreased. Mild paraseptal emphysematous changes in the right lung, upper lobe predominant. 3 mm medial right lower lobe pulmonary nodule (series 6/ image 103). 5 mm right lower lobe pulmonary nodule (series 6/ image 116). Multiple additional scattered 3-5 mm pulmonary nodules in the right lung, approximately 10-15 in number. No  pneumothorax. Musculoskeletal: No focal osseous lesions. CT ABDOMEN PELVIS FINDINGS Hepatobiliary: Liver is within normal limits. Gallbladder is unremarkable. No intrahepatic or extrahepatic ductal dilatation. Pancreas: Within normal limits. Spleen: Within normal limits. Adrenals/Urinary Tract: Adrenal glands are within normal limits. Kidneys are within normal limits.  No hydronephrosis. Bladder is within normal limits. Stomach/Bowel: Stomach is within normal limits. No evidence of bowel obstruction. Normal appendix (series 2/image 95). Sigmoid colon is mildly thick-walled although underdistended. Mild sigmoid diverticulosis. Vascular/Lymphatic: No evidence of abdominal aortic aneurysm. No suspicious abdominopelvic lymphadenopathy. Reproductive: Prostate is unremarkable. Other: No abdominopelvic ascites. Musculoskeletal: Very mild degenerative changes of the lumbar spine. IMPRESSION: Complete left lung collapse in this patient with known primary bronchogenic neoplasm. Small prevascular and subcarinal nodes measuring up to 9 mm short axis. Scattered small right lung nodules measuring up to 5 mm, indeterminate, but possibly new/progressed from recent CT. Contralateral pulmonary metastases are not excluded. Attention on follow-up is suggested. No findings suspicious for metastatic disease in the abdomen/pelvis. Electronically Signed   By: Julian Hy M.D.   On: 07/23/2016 14:31   Ct Abdomen Pelvis W Contrast  Result Date: 07/23/2016 CLINICAL DATA:  Stage III lung cancer, diagnosed December 2017, status post chemotherapy/XRT EXAM: CT CHEST, ABDOMEN, AND PELVIS WITH CONTRAST TECHNIQUE: Multidetector CT imaging of the chest, abdomen and pelvis was performed following the standard protocol during bolus administration of intravenous contrast. CONTRAST:  100 ISOVUE-300 IOPAMIDOL (ISOVUE-300) INJECTION 61% COMPARISON:  CT chest dated  05/30/2016.  PET-CT dated 05/11/2016. FINDINGS: CT CHEST FINDINGS Cardiovascular:  Heart is normal in size. No pericardial effusion. Leftward cardiomediastinal shift. No evidence of thoracic aortic aneurysm. Mediastinum/Nodes: Small prevascular nodes measuring up to 8 mm short axis, grossly unchanged. 9 mm subcarinal node (series 2/ image 26), grossly unchanged. Lungs/Pleura: Complete opacification of the left upper lobe. Bronchiectasis with peribronchial thickening and increased interstitial markings in the left lower lobe. Trace left pleural effusion, decreased. Mild paraseptal emphysematous changes in the right lung, upper lobe predominant. 3 mm medial right lower lobe pulmonary nodule (series 6/ image 103). 5 mm right lower lobe pulmonary nodule (series 6/ image 116). Multiple additional scattered 3-5 mm pulmonary nodules in the right lung, approximately 10-15 in number. No pneumothorax. Musculoskeletal: No focal osseous lesions. CT ABDOMEN PELVIS FINDINGS Hepatobiliary: Liver is within normal limits. Gallbladder is unremarkable. No intrahepatic or extrahepatic ductal dilatation. Pancreas: Within normal limits. Spleen: Within normal limits. Adrenals/Urinary Tract: Adrenal glands are within normal limits. Kidneys are within normal limits.  No hydronephrosis. Bladder is within normal limits. Stomach/Bowel: Stomach is within normal limits. No evidence of bowel obstruction. Normal appendix (series 2/image 95). Sigmoid colon is mildly thick-walled although underdistended. Mild sigmoid diverticulosis. Vascular/Lymphatic: No evidence of abdominal aortic aneurysm. No suspicious abdominopelvic lymphadenopathy. Reproductive: Prostate is unremarkable. Other: No abdominopelvic ascites. Musculoskeletal: Very mild degenerative changes of the lumbar spine. IMPRESSION: Complete left lung collapse in this patient with known primary bronchogenic neoplasm. Small prevascular and subcarinal nodes measuring up to 9 mm short axis. Scattered small right lung nodules measuring up to 5 mm, indeterminate, but possibly  new/progressed from recent CT. Contralateral pulmonary metastases are not excluded. Attention on follow-up is suggested. No findings suspicious for metastatic disease in the abdomen/pelvis. Electronically Signed   By: Julian Hy M.D.   On: 07/23/2016 14:31    ASSESSMENT AND PLAN:  This is a very pleasant 46 years old white male with a stage IIIa non-small cell lung cancer, squamous cell carcinoma diagnosed in December 2017 with central obstructing left lung mass as well as mediastinal lymphadenopathy status post a course of concurrent chemoradiation with weekly carboplatin and paclitaxel. He is currently undergoing consolidation treatment with Imfinzi (Durvalumab) status post 1 cycle. He tolerated the first cycle of this treatment well with no significant adverse effects except for mild nausea. I recommended for the patient to proceed with cycle #2 today as a scheduled. For the nausea I gave the patient refill of Compazine. For pain management I started the patient on tramadol 50 mg by mouth every 12 hours as needed for pain. He would come back for follow-up visit in 2 weeks for evaluation before the next cycle of his treatment. The patient was advised to call immediately if he has any concerning symptoms in the interval. The patient voices understanding of current disease status and treatment options and is in agreement with the current care plan. All questions were answered. The patient knows to call the clinic with any problems, questions or concerns. We can certainly see the patient much sooner if necessary. I spent 10 minutes counseling the patient face to face. The total time spent in the appointment was 15 minutes. Disclaimer: This note was dictated with voice recognition software. Similar sounding words can inadvertently be transcribed and may not be corrected upon review.

## 2016-08-30 ENCOUNTER — Encounter: Payer: Self-pay | Admitting: *Deleted

## 2016-08-30 NOTE — Progress Notes (Signed)
0900 Called Douglas Kelly unable to reach called emergency number for his mother she will have him call me back at 336 [phone] about his request for combivent inhaler and ask for the nurse Tonie.

## 2016-08-30 NOTE — Progress Notes (Signed)
Oncology Nurse Navigator Documentation  Oncology Nurse Navigator Flowsheets 08/30/2016  Navigator Location [facility]  Navigator Encounter Type Other/I received an in-basket message from [place] working with Shona Simpson PA-C. She inquired if Dr. Julien Nordmann would refill an inhaler for Douglas Kelly.  I updated Dr. Julien Nordmann.  He requested I make a referral to pulmonary for inhaler issues.  I completed referral and updated Rad Onc RN, Tonie and [employer].   Treatment Phase Treatment  Barriers/Navigation Needs Coordination of Care  Interventions Coordination of Care  Coordination of Care Other  Acuity Level 2  Time Spent with Patient 30

## 2016-09-01 ENCOUNTER — Other Ambulatory Visit: Payer: Medicaid Other

## 2016-09-01 ENCOUNTER — Ambulatory Visit: Payer: Medicaid Other

## 2016-09-15 ENCOUNTER — Other Ambulatory Visit (HOSPITAL_BASED_OUTPATIENT_CLINIC_OR_DEPARTMENT_OTHER): Payer: Medicaid Other

## 2016-09-15 ENCOUNTER — Encounter: Payer: Self-pay | Admitting: Internal Medicine

## 2016-09-15 ENCOUNTER — Ambulatory Visit (HOSPITAL_BASED_OUTPATIENT_CLINIC_OR_DEPARTMENT_OTHER): Payer: Medicaid Other | Admitting: Internal Medicine

## 2016-09-15 ENCOUNTER — Ambulatory Visit (HOSPITAL_BASED_OUTPATIENT_CLINIC_OR_DEPARTMENT_OTHER): Payer: Medicaid Other

## 2016-09-15 ENCOUNTER — Telehealth: Payer: Self-pay | Admitting: Internal Medicine

## 2016-09-15 VITALS — BP 103/71 | HR 102 | Temp 97.5°F | Resp 20 | Ht 68.0 in | Wt 151.6 lb

## 2016-09-15 DIAGNOSIS — C3492 Malignant neoplasm of unspecified part of left bronchus or lung: Secondary | ICD-10-CM

## 2016-09-15 DIAGNOSIS — Z5112 Encounter for antineoplastic immunotherapy: Secondary | ICD-10-CM

## 2016-09-15 DIAGNOSIS — R5382 Chronic fatigue, unspecified: Secondary | ICD-10-CM | POA: Diagnosis not present

## 2016-09-15 DIAGNOSIS — G893 Neoplasm related pain (acute) (chronic): Secondary | ICD-10-CM | POA: Diagnosis not present

## 2016-09-15 DIAGNOSIS — D649 Anemia, unspecified: Secondary | ICD-10-CM

## 2016-09-15 LAB — CBC WITH DIFFERENTIAL/PLATELET
BASO%: 0.7 % (ref 0.0–2.0)
BASOS ABS: 0.1 10*3/uL (ref 0.0–0.1)
EOS ABS: 0.2 10*3/uL (ref 0.0–0.5)
EOS%: 2.8 % (ref 0.0–7.0)
HCT: 37.5 % — ABNORMAL LOW (ref 38.4–49.9)
HGB: 12.8 g/dL — ABNORMAL LOW (ref 13.0–17.1)
LYMPH%: 5.9 % — AB (ref 14.0–49.0)
MCH: 33.1 pg (ref 27.2–33.4)
MCHC: 34.1 g/dL (ref 32.0–36.0)
MCV: 97.1 fL (ref 79.3–98.0)
MONO#: 0.4 10*3/uL (ref 0.1–0.9)
MONO%: 4.7 % (ref 0.0–14.0)
NEUT#: 7 10*3/uL — ABNORMAL HIGH (ref 1.5–6.5)
NEUT%: 85.9 % — ABNORMAL HIGH (ref 39.0–75.0)
Platelets: 376 10*3/uL (ref 140–400)
RBC: 3.86 10*6/uL — AB (ref 4.20–5.82)
RDW: 12.8 % (ref 11.0–14.6)
WBC: 8.1 10*3/uL (ref 4.0–10.3)
lymph#: 0.5 10*3/uL — ABNORMAL LOW (ref 0.9–3.3)

## 2016-09-15 LAB — COMPREHENSIVE METABOLIC PANEL
ALK PHOS: 89 U/L (ref 40–150)
ALT: 33 U/L (ref 0–55)
AST: 16 U/L (ref 5–34)
Albumin: 3.1 g/dL — ABNORMAL LOW (ref 3.5–5.0)
Anion Gap: 10 mEq/L (ref 3–11)
BUN: 9.1 mg/dL (ref 7.0–26.0)
CHLORIDE: 107 meq/L (ref 98–109)
CO2: 24 meq/L (ref 22–29)
Calcium: 9.3 mg/dL (ref 8.4–10.4)
Creatinine: 0.9 mg/dL (ref 0.7–1.3)
GLUCOSE: 193 mg/dL — AB (ref 70–140)
POTASSIUM: 3.9 meq/L (ref 3.5–5.1)
SODIUM: 141 meq/L (ref 136–145)
Total Bilirubin: 0.23 mg/dL (ref 0.20–1.20)
Total Protein: 7 g/dL (ref 6.4–8.3)

## 2016-09-15 LAB — TSH: TSH: 0.949 m(IU)/L (ref 0.320–4.118)

## 2016-09-15 MED ORDER — SODIUM CHLORIDE 0.9 % IV SOLN
Freq: Once | INTRAVENOUS | Status: AC
  Administered 2016-09-15: 14:00:00 via INTRAVENOUS

## 2016-09-15 MED ORDER — DURVALUMAB 500 MG/10ML IV SOLN
9.0000 mg/kg | Freq: Once | INTRAVENOUS | Status: AC
  Administered 2016-09-15: 620 mg via INTRAVENOUS
  Filled 2016-09-15: qty 10

## 2016-09-15 NOTE — Patient Instructions (Signed)
Durvalumab injection  What is this medicine?  DURVALUMAB (dur VAL ue mab) is a monoclonal antibody. It is used to treat urothelial cancer.  This medicine may be used for other purposes; ask your health care provider or pharmacist if you have questions.  COMMON BRAND NAME(S): IMFINZI  What should I tell my health care provider before I take this medicine?  They need to know if you have any of these conditions:  -diabetes  -immune system problems  -infection  -inflammatory bowel disease  -kidney disease  -liver disease  -lung or breathing disease  -lupus  -organ transplant  -stomach or intestine problems  -thyroid disease  -an unusual or allergic reaction to durvalumab, other medicines, foods, dyes, or preservatives  -pregnant or trying to get pregnant  -breast-feeding  How should I use this medicine?  This medicine is for infusion into a vein. It is given by a health care professional in a hospital or clinic setting.  A special MedGuide will be given to you before each treatment. Be sure to read this information carefully each time.  Talk to your pediatrician regarding the use of this medicine in children. Special care may be needed.  Overdosage: If you think you have taken too much of this medicine contact a poison control center or emergency room at once.  NOTE: This medicine is only for you. Do not share this medicine with others.  What if I miss a dose?  It is important not to miss your dose. Call your doctor or health care professional if you are unable to keep an appointment.  What may interact with this medicine?  Interactions have not been studied.  This list may not describe all possible interactions. Give your health care provider a list of all the medicines, herbs, non-prescription drugs, or dietary supplements you use. Also tell them if you smoke, drink alcohol, or use illegal drugs. Some items may interact with your medicine.  What should I watch for while using this medicine?  This drug may make you  feel generally unwell. Continue your course of treatment even though you feel ill unless your doctor tells you to stop.  You may need blood work done while you are taking this medicine.  Do not become pregnant while taking this medicine or for 3 months after stopping it. Women should inform their doctor if they wish to become pregnant or think they might be pregnant. There is a potential for serious side effects to an unborn child. Talk to your health care professional or pharmacist for more information. Do not breast-feed an infant while taking this medicine or for 3 months after stopping it.  What side effects may I notice from receiving this medicine?  Side effects that you should report to your doctor or health care professional as soon as possible:  -allergic reactions like skin rash, itching or hives, swelling of the face, lips, or tongue  -black, tarry stools  -bloody or watery diarrhea  -breathing problems  -change in emotions or moods  -change in sex drive  -changes in vision  -chest pain or chest tightness  -chills  -confusion  -cough  -facial flushing  -fever  -headache  -signs and symptoms of high blood sugar such as dizziness; dry mouth; dry skin; fruity breath; nausea; stomach pain; increased hunger or thirst; increased urination  -signs and symptoms of liver injury like dark yellow or brown urine; general ill feeling or flu-like symptoms; light-colored stools; loss of appetite; nausea; right upper belly pain;   unusually weak or tired; yellowing of the eyes or skin  -stomach pain  -trouble passing urine or change in the amount of urine  -weight gain or weight loss  Side effects that usually do not require medical attention (report these to your doctor or health care professional if they continue or are bothersome):  -bone pain  -constipation  -loss of appetite  -muscle pain  -nausea  -swelling of the ankles, feet, hands  -tiredness  This list may not describe all possible side effects. Call your doctor  for medical advice about side effects. You may report side effects to FDA at 1-800-FDA-1088.  Where should I keep my medicine?  This drug is given in a hospital or clinic and will not be stored at home.  NOTE: This sheet is a summary. It may not cover all possible information. If you have questions about this medicine, talk to your doctor, pharmacist, or health care provider.  © 2018 [employer] (2015-11-28 15:50:36)

## 2016-09-15 NOTE — Telephone Encounter (Signed)
Appointments scheduled per 5.9.18 LOS. Patient given AVS report and calendars with future scheduled appointments.

## 2016-09-15 NOTE — Progress Notes (Signed)
[place] Telephone:(336) [phone]   Fax:(336) [phone]  OFFICE PROGRESS NOTE  Patient, No Pcp Per No address on file  DIAGNOSIS: Stage IIIA (T2a, N2, M0) non-small cell lung cancer, squamous cell carcinoma diagnosed in December 2017 with almost complete collapse of the left lung.  PRIOR THERAPY: Concurrent chemoradiation with weekly carboplatin for AUC of 2 and paclitaxel 45 MG/M2. The patient is status post 6 weeks of radiation but only 2 cycles of chemotherapy because he missed several appointments.  CURRENT THERAPY: Immunotherapy with Imfinzi (Durvalumab) 10 MG/KG every 2 weeks, first dose 08/04/2016. Status post 2 cycles.  INTERVAL HISTORY: Douglas Kelly 46 y.o. male returns to the clinic today for follow-up visit accompanied by his girlfriend. The patient is tolerating his current treatment with Imfinzi (Durvalumab) fairly well with no significant adverse effects. He denied having any skin rash or diarrhea. He denied having any weight loss or night sweats. He continues to have intermittent left-sided chest pain with shortness of breath with exertion and mild cough with no hemoptysis. He has no fever or chills. He has no nausea, vomiting, diarrhea or constipation. He is here today for evaluation before starting cycle #3.   MEDICAL HISTORY: Past Medical History:  Diagnosis Date  . 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 Encounter for antineoplastic chemotherapy 05/14/2016  . Goals of care, counseling/discussion 07/27/2016  . Pneumonia     ALLERGIES:  has No Known Allergies.  MEDICATIONS:  Current Outpatient Prescriptions  Medication Sig Dispense Refill  . bisacodyl (BISACODYL) 5 MG EC tablet Take 1 tablet (5 mg total) by mouth daily as needed for moderate constipation. (Patient not taking: Reported on 07/27/2016) 30 tablet 0  . Ipratropium-Albuterol (COMBIVENT) 20-100 MCG/ACT AERS respimat Inhale 1 puff into the lungs every 6 (six) hours as needed for wheezing or  shortness of breath. (Patient not taking: Reported on 08/18/2016) 1 Inhaler 0  . nicotine (NICODERM CQ - DOSED IN MG/24 HOURS) 21 mg/24hr patch Place 1 patch (21 mg total) onto the skin daily. (Patient not taking: Reported on 08/18/2016) 28 patch 0  . prochlorperazine (COMPAZINE) 10 MG tablet Take 1 tablet (10 mg total) by mouth every 6 (six) hours as needed for nausea or vomiting. (Patient not taking: Reported on 07/27/2016) 30 tablet 0  . prochlorperazine (COMPAZINE) 10 MG tablet Take 1 tablet (10 mg total) by mouth every 6 (six) hours as needed for nausea or vomiting. 30 tablet 0  . traMADol (ULTRAM) 50 MG tablet Take 1 tablet (50 mg total) by mouth every 12 (twelve) hours as needed. 60 tablet 0   No current facility-administered medications for this visit.     SURGICAL HISTORY:  Past Surgical History:  Procedure Laterality Date  . VIDEO BRONCHOSCOPY Bilateral 04/19/2016   Procedure: VIDEO BRONCHOSCOPY WITHOUT FLUORO;  Surgeon: Juanito Doom, MD;  Location: [facility];  Service: Cardiopulmonary;  Laterality: Bilateral;    REVIEW OF SYSTEMS:  A comprehensive review of systems was negative except for: Respiratory: positive for cough, dyspnea on exertion and pleurisy/chest pain   PHYSICAL EXAMINATION: General appearance: alert, cooperative and no distress Head: Normocephalic, without obvious abnormality, atraumatic Neck: no adenopathy, no JVD, supple, symmetrical, trachea midline and thyroid not enlarged, symmetric, no tenderness/mass/nodules Lymph nodes: Cervical, supraclavicular, and axillary nodes normal. Resp: diminished breath sounds LUL and dullness to percussion LUL Back: symmetric, no curvature. ROM normal. No CVA tenderness. Cardio: regular rate and rhythm, S1, S2 normal, no murmur, click, rub or gallop GI: soft,  non-tender; bowel sounds normal; no masses,  no organomegaly Extremities: extremities normal, atraumatic, no cyanosis or edema  ECOG PERFORMANCE STATUS: 1 -  Symptomatic but completely ambulatory  Blood pressure 103/71, pulse (!) 102, temperature 97.5 F (36.4 C), temperature source Oral, resp. rate 20, height '5\' 8"'$  (1.727 m), weight 151 lb 9.6 oz (68.8 kg), SpO2 98 %.  LABORATORY DATA: Lab Results  Component Value Date   WBC 8.1 09/15/2016   HGB 12.8 (L) 09/15/2016   HCT 37.5 (L) 09/15/2016   MCV 97.1 09/15/2016   PLT 376 09/15/2016      Chemistry      Component Value Date/Time   NA 137 08/18/2016 1212   NA 137 08/06/2016 1407   K 4.7 08/18/2016 1212   K 3.9 08/06/2016 1407   CL 104 08/18/2016 1212   CO2 26 08/18/2016 1212   CO2 21 (L) 08/06/2016 1407   BUN 18 08/18/2016 1212   BUN 15.0 08/06/2016 1407   CREATININE 1.03 08/18/2016 1212   CREATININE 0.9 08/06/2016 1407      Component Value Date/Time   CALCIUM 9.4 08/18/2016 1212   CALCIUM 9.5 08/06/2016 1407   ALKPHOS 96 08/18/2016 1212   ALKPHOS 105 08/06/2016 1407   AST 18 08/18/2016 1212   AST 15 08/06/2016 1407   ALT 22 08/18/2016 1212   ALT 21 08/06/2016 1407   BILITOT 0.4 08/18/2016 1212   BILITOT <0.22 08/06/2016 1407       RADIOGRAPHIC STUDIES: No results found.  ASSESSMENT AND PLAN:  This is a very pleasant 46 years old white male with history of stage IIIa non-small cell lung cancer, squamous cell carcinoma with central obstructing left lung mass as well as mediastinal lymphadenopathy diagnosed in December 2017. The patient underwent a course of concurrent chemoradiation with weekly carboplatin and paclitaxel and he is currently on consolidation treatment with immunotherapy with Imfinzi (Durvalumab) status post 2 cycles. He continues to tolerate his treatment well with no significant adverse effects. I recommended for the patient to proceed with cycle #3 today as scheduled. I will see him back for follow-up visit in 2 weeks for evaluation before starting cycle #4. For pain management the patient will continue his current treatment with tramadol as  needed. He was advised to call immediately if he has any concerning symptoms in the interval. The patient voices understanding of current disease status and treatment options and is in agreement with the current care plan. All questions were answered. The patient knows to call the clinic with any problems, questions or concerns. We can certainly see the patient much sooner if necessary. I spent 10 minutes counseling the patient face to face. The total time spent in the appointment was 15 minutes. Disclaimer: This note was dictated with voice recognition software. Similar sounding words can inadvertently be transcribed and may not be corrected upon review.

## 2016-09-28 ENCOUNTER — Inpatient Hospital Stay (HOSPITAL_COMMUNITY)
Admission: EM | Admit: 2016-09-28 | Discharge: 2016-10-01 | DRG: 314 | Disposition: A | Discharge status: Home / Self Care | Payer: Medicaid Other | Attending: Internal Medicine | Admitting: Internal Medicine

## 2016-09-28 ENCOUNTER — Observation Stay (HOSPITAL_BASED_OUTPATIENT_CLINIC_OR_DEPARTMENT_OTHER): Payer: Medicaid Other

## 2016-09-28 ENCOUNTER — Telehealth: Payer: Self-pay | Admitting: *Deleted

## 2016-09-28 ENCOUNTER — Emergency Department (HOSPITAL_COMMUNITY): Payer: Medicaid Other

## 2016-09-28 ENCOUNTER — Encounter (HOSPITAL_COMMUNITY): Payer: Self-pay | Admitting: Emergency Medicine

## 2016-09-28 ENCOUNTER — Observation Stay (HOSPITAL_COMMUNITY): Payer: Medicaid Other

## 2016-09-28 DIAGNOSIS — R042 Hemoptysis: Secondary | ICD-10-CM | POA: Diagnosis present

## 2016-09-28 DIAGNOSIS — C342 Malignant neoplasm of middle lobe, bronchus or lung: Secondary | ICD-10-CM | POA: Diagnosis not present

## 2016-09-28 DIAGNOSIS — R609 Edema, unspecified: Secondary | ICD-10-CM

## 2016-09-28 DIAGNOSIS — E43 Unspecified severe protein-calorie malnutrition: Secondary | ICD-10-CM | POA: Diagnosis present

## 2016-09-28 DIAGNOSIS — I319 Disease of pericardium, unspecified: Secondary | ICD-10-CM | POA: Diagnosis present

## 2016-09-28 DIAGNOSIS — Z87891 Personal history of nicotine dependence: Secondary | ICD-10-CM

## 2016-09-28 DIAGNOSIS — C3412 Malignant neoplasm of upper lobe, left bronchus or lung: Secondary | ICD-10-CM | POA: Diagnosis present

## 2016-09-28 DIAGNOSIS — R079 Chest pain, unspecified: Secondary | ICD-10-CM | POA: Diagnosis not present

## 2016-09-28 DIAGNOSIS — C3492 Malignant neoplasm of unspecified part of left bronchus or lung: Secondary | ICD-10-CM | POA: Diagnosis not present

## 2016-09-28 DIAGNOSIS — I309 Acute pericarditis, unspecified: Principal | ICD-10-CM | POA: Diagnosis present

## 2016-09-28 DIAGNOSIS — C3490 Malignant neoplasm of unspecified part of unspecified bronchus or lung: Secondary | ICD-10-CM | POA: Diagnosis not present

## 2016-09-28 DIAGNOSIS — Z923 Personal history of irradiation: Secondary | ICD-10-CM

## 2016-09-28 DIAGNOSIS — R0602 Shortness of breath: Secondary | ICD-10-CM

## 2016-09-28 DIAGNOSIS — J9811 Atelectasis: Secondary | ICD-10-CM | POA: Diagnosis not present

## 2016-09-28 DIAGNOSIS — I318 Other specified diseases of pericardium: Secondary | ICD-10-CM | POA: Diagnosis not present

## 2016-09-28 DIAGNOSIS — I300 Acute nonspecific idiopathic pericarditis: Secondary | ICD-10-CM | POA: Diagnosis not present

## 2016-09-28 DIAGNOSIS — Z79899 Other long term (current) drug therapy: Secondary | ICD-10-CM

## 2016-09-28 LAB — BASIC METABOLIC PANEL
Anion gap: 12 (ref 5–15)
BUN: 10 mg/dL (ref 6–20)
CALCIUM: 9.1 mg/dL (ref 8.9–10.3)
CO2: 24 mmol/L (ref 22–32)
CREATININE: 0.71 mg/dL (ref 0.61–1.24)
Chloride: 95 mmol/L — ABNORMAL LOW (ref 101–111)
GFR calc Af Amer: >60 mL/min (ref >60)
GLUCOSE: 123 mg/dL — AB (ref 65–99)
Potassium: 4.2 mmol/L (ref 3.5–5.1)
Sodium: 131 mmol/L — ABNORMAL LOW (ref 135–145)

## 2016-09-28 LAB — CBC
HCT: 33.5 % — ABNORMAL LOW (ref 39.0–52.0)
HEMOGLOBIN: 11.4 g/dL — AB (ref 13.0–17.0)
MCH: 32.3 pg (ref 26.0–34.0)
MCHC: 34 g/dL (ref 30.0–36.0)
MCV: 94.9 fL (ref 78.0–100.0)
Platelets: 349 10*3/uL (ref 150–400)
RBC: 3.53 MIL/uL — ABNORMAL LOW (ref 4.22–5.81)
RDW: 12 % (ref 11.5–15.5)
WBC: 14.6 10*3/uL — ABNORMAL HIGH (ref 4.0–10.5)

## 2016-09-28 LAB — PROTIME-INR
INR: 1.03
PROTHROMBIN TIME: 13.6 s (ref 11.4–15.2)

## 2016-09-28 LAB — CREATININE, SERUM
Creatinine, Ser: 0.87 mg/dL (ref 0.61–1.24)
GFR calc Af Amer: >60 mL/min (ref >60)
GFR calc non Af Amer: >60 mL/min (ref >60)

## 2016-09-28 LAB — CBC WITH DIFFERENTIAL/PLATELET
BASOS PCT: 0 %
Basophils Absolute: 0 10*3/uL (ref 0.0–0.1)
EOS ABS: 0 10*3/uL (ref 0.0–0.7)
EOS PCT: 0 %
HEMATOCRIT: 36.3 % — AB (ref 39.0–52.0)
HEMOGLOBIN: 12.4 g/dL — AB (ref 13.0–17.0)
LYMPHS PCT: 6 %
Lymphs Abs: 1.1 10*3/uL (ref 0.7–4.0)
MCH: 32.3 pg (ref 26.0–34.0)
MCHC: 34.2 g/dL (ref 30.0–36.0)
MCV: 94.5 fL (ref 78.0–100.0)
MONOS PCT: 3 %
Monocytes Absolute: 0.6 10*3/uL (ref 0.1–1.0)
NEUTROS ABS: 17 10*3/uL — AB (ref 1.7–7.7)
NEUTROS PCT: 91 %
Platelets: 407 10*3/uL — ABNORMAL HIGH (ref 150–400)
RBC: 3.84 MIL/uL — ABNORMAL LOW (ref 4.22–5.81)
RDW: 11.8 % (ref 11.5–15.5)
WBC: 18.7 10*3/uL — ABNORMAL HIGH (ref 4.0–10.5)

## 2016-09-28 LAB — TROPONIN I
Troponin I: <0.03 ng/mL (ref <0.03)
Troponin I: <0.03 ng/mL (ref <0.03)

## 2016-09-28 LAB — I-STAT CG4 LACTIC ACID, ED: Lactic Acid, Venous: 1.51 mmol/L (ref 0.5–1.9)

## 2016-09-28 LAB — I-STAT TROPONIN, ED: Troponin i, poc: 0.01 ng/mL (ref 0.00–0.08)

## 2016-09-28 MED ORDER — KETOROLAC TROMETHAMINE 15 MG/ML IJ SOLN
15.0000 mg | Freq: Once | INTRAMUSCULAR | Status: AC
  Administered 2016-09-28: 15 mg via INTRAVENOUS
  Filled 2016-09-28: qty 1

## 2016-09-28 MED ORDER — ENOXAPARIN SODIUM 40 MG/0.4ML ~~LOC~~ SOLN
40.0000 mg | SUBCUTANEOUS | Status: DC
  Administered 2016-09-28: 40 mg via SUBCUTANEOUS
  Filled 2016-09-28: qty 0.4

## 2016-09-28 MED ORDER — COLCHICINE 0.6 MG PO TABS
0.6000 mg | ORAL_TABLET | Freq: Two times a day (BID) | ORAL | Status: DC
  Administered 2016-09-28 – 2016-10-01 (×7): 0.6 mg via ORAL
  Filled 2016-09-28 (×7): qty 1

## 2016-09-28 MED ORDER — SODIUM CHLORIDE 0.9% FLUSH
3.0000 mL | Freq: Two times a day (BID) | INTRAVENOUS | Status: DC
  Administered 2016-09-28 – 2016-10-01 (×6): 3 mL via INTRAVENOUS

## 2016-09-28 MED ORDER — ALBUTEROL SULFATE (2.5 MG/3ML) 0.083% IN NEBU
2.5000 mg | INHALATION_SOLUTION | RESPIRATORY_TRACT | Status: DC | PRN
  Administered 2016-09-28 – 2016-09-29 (×4): 2.5 mg via RESPIRATORY_TRACT
  Filled 2016-09-28 (×4): qty 3

## 2016-09-28 MED ORDER — ONDANSETRON HCL 4 MG PO TABS: 4.0000 mg | ORAL_TABLET | Freq: Four times a day (QID) | ORAL | Status: DC | PRN

## 2016-09-28 MED ORDER — MORPHINE SULFATE (PF) 4 MG/ML IV SOLN
1.0000 mg | INTRAVENOUS | Status: DC | PRN
  Administered 2016-09-28: 2 mg via INTRAVENOUS
  Filled 2016-09-28: qty 1

## 2016-09-28 MED ORDER — SODIUM CHLORIDE 0.9 % IV SOLN: 250.0000 mL | INTRAVENOUS | Status: DC | PRN

## 2016-09-28 MED ORDER — ASPIRIN 81 MG PO CHEW
324.0000 mg | CHEWABLE_TABLET | Freq: Once | ORAL | Status: AC
  Administered 2016-09-28: 324 mg via ORAL

## 2016-09-28 MED ORDER — NITROGLYCERIN 0.4 MG SL SUBL
0.4000 mg | SUBLINGUAL_TABLET | SUBLINGUAL | Status: DC | PRN
  Administered 2016-09-28 (×3): 0.4 mg via SUBLINGUAL

## 2016-09-28 MED ORDER — HYDROMORPHONE HCL 1 MG/ML IJ SOLN
1.0000 mg | INTRAMUSCULAR | Status: DC | PRN
  Administered 2016-09-28 – 2016-10-01 (×13): 1 mg via INTRAVENOUS
  Filled 2016-09-28 (×21): qty 1

## 2016-09-28 MED ORDER — ONDANSETRON HCL 4 MG/2ML IJ SOLN: 4.0000 mg | Freq: Four times a day (QID) | INTRAMUSCULAR | Status: DC | PRN

## 2016-09-28 MED ORDER — ACETAMINOPHEN 650 MG RE SUPP: 650.0000 mg | Freq: Four times a day (QID) | RECTAL | Status: DC | PRN

## 2016-09-28 MED ORDER — SODIUM CHLORIDE 0.9% FLUSH: 3.0000 mL | INTRAVENOUS | Status: DC | PRN

## 2016-09-28 MED ORDER — OXYCODONE HCL 5 MG PO TABS
5.0000 mg | ORAL_TABLET | ORAL | Status: DC | PRN
  Administered 2016-09-28 – 2016-09-29 (×5): 5 mg via ORAL
  Filled 2016-09-28 (×5): qty 1

## 2016-09-28 MED ORDER — PANTOPRAZOLE SODIUM 40 MG PO TBEC
40.0000 mg | DELAYED_RELEASE_TABLET | Freq: Every day | ORAL | Status: DC
  Administered 2016-09-28 – 2016-10-01 (×4): 40 mg via ORAL
  Filled 2016-09-28 (×4): qty 1

## 2016-09-28 MED ORDER — ACETAMINOPHEN 325 MG PO TABS
650.0000 mg | ORAL_TABLET | Freq: Four times a day (QID) | ORAL | Status: DC | PRN
  Filled 2016-09-28: qty 2

## 2016-09-28 MED ORDER — IBUPROFEN 200 MG PO TABS
600.0000 mg | ORAL_TABLET | Freq: Three times a day (TID) | ORAL | Status: DC
  Administered 2016-09-28 – 2016-09-30 (×6): 600 mg via ORAL
  Filled 2016-09-28 (×6): qty 3

## 2016-09-28 NOTE — ED Notes (Signed)
Pt unable to go to 4W, nurse getting another PACU patient. Will call at 14:50. [place] with patient for venous study

## 2016-09-28 NOTE — ED Provider Notes (Signed)
[facility] DEPT Provider Note   CSN: [id number] Arrival date & time: 09/28/16  1035     History   Chief Complaint Chief Complaint  Patient presents with  . Shortness of Breath  . Chest Pain    HPI Douglas Kelly is a 46 y.o. male.  46 year old male presents with acute onset of substernal chest pain that began approximately 7 hours prior to arrival. Pain is been constant and worse with coughing and taking a deep breath. Does have a history of pneumothorax with chest tube placement in the past. Has had some cough congestion but denies any fever or chills. Has had some dyspnea on exertion but denies any associated nausea or diaphoresis. Did take some of his pain reliever medication without relief. No prior history of cardiac disease.      Past Medical History:  Diagnosis Date  . 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 Encounter for antineoplastic chemotherapy 05/14/2016  . Goals of care, counseling/discussion 07/27/2016  . Pneumonia     Patient Active Problem List   Diagnosis Date Noted  . Goals of care, counseling/discussion 07/27/2016  . Encounter for antineoplastic immunotherapy 07/27/2016  . 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 Hypersensitivity reaction 06/08/2016  . Pneumothorax 05/22/2016  . Stage III squamous cell carcinoma of left lung ([place]) 05/14/2016  . Encounter for antineoplastic chemotherapy 05/14/2016  . Protein-calorie malnutrition, severe 04/17/2016  . Collapse of left lung, hx of pneumonia 5 months ago 04/16/2016  . Pneumonia of left upper lobe due to infectious organism ([place])   . Postobstructive pneumonia 04/15/2016  . Normocytic anemia 04/15/2016  . LFTs abnormal 04/15/2016  . Smoker 04/15/2016    Past Surgical History:  Procedure Laterality Date  . VIDEO BRONCHOSCOPY Bilateral 04/19/2016   Procedure: VIDEO BRONCHOSCOPY WITHOUT FLUORO;  Surgeon: Juanito Doom, MD;  Location: [facility];  Service: Cardiopulmonary;  Laterality: Bilateral;        Home Medications    Prior to Admission medications   Medication Sig Start Date End Date Taking? Authorizing Provider  bisacodyl (BISACODYL) 5 MG EC tablet Take 1 tablet (5 mg total) by mouth daily as needed for moderate constipation. Patient not taking: Reported on 07/27/2016 04/23/16   Janece Canterbury, MD  nicotine (NICODERM CQ - DOSED IN MG/24 HOURS) 21 mg/24hr patch Place 1 patch (21 mg total) onto the skin daily. Patient not taking: Reported on 08/18/2016 04/23/16   Janece Canterbury, MD  prochlorperazine (COMPAZINE) 10 MG tablet Take 1 tablet (10 mg total) by mouth every 6 (six) hours as needed for nausea or vomiting. Patient not taking: Reported on 09/15/2016 08/18/16   Curt Bears, MD    Family History History reviewed. No pertinent family history.  Social History Social History  Substance Use Topics  . Smoking status: Former Smoker    Packs/day: 1.00    Types: Cigarettes    Quit date: 04/13/2016  . Smokeless tobacco: Never Used  . Alcohol use No     Allergies   Patient has no known allergies.   Review of Systems Review of Systems  All other systems reviewed and are negative.    Physical Exam Updated Vital Signs Pulse (!) 112   Temp 99.7 F (37.6 C) (Oral)   Resp (!) 22   SpO2 95%   Physical Exam  Constitutional: He is oriented to person, place, and time. He appears well-developed and well-nourished.  Non-toxic appearance. No distress.  HENT:  Head: Normocephalic and atraumatic.  Eyes: Conjunctivae, EOM and lids are normal. Pupils  are equal, round, and reactive to light.  Neck: Normal range of motion. Neck supple. No tracheal deviation present. No thyroid mass present.  Cardiovascular: Regular rhythm and normal heart sounds.  Tachycardia present.  Exam reveals no gallop.   No murmur heard. Pulmonary/Chest: Breath sounds normal. No stridor. Tachypnea noted. No respiratory distress. He has no wheezes. He has no rales.  Abdominal: Soft. Normal  appearance and bowel sounds are normal. He exhibits no distension. There is no tenderness. There is no rebound and no CVA tenderness.  Musculoskeletal: Normal range of motion. He exhibits no edema or tenderness.  Neurological: He is alert and oriented to person, place, and time. He has normal strength. No cranial nerve deficit or sensory deficit. GCS eye subscore is 4. GCS verbal subscore is 5. GCS motor subscore is 6.  Skin: Skin is warm and dry. No abrasion and no rash noted.  Psychiatric: He has a normal mood and affect. His speech is normal and behavior is normal.  Nursing note and vitals reviewed.    ED Treatments / Results  Labs (all labs ordered are listed, but only abnormal results are displayed) Labs Reviewed  CULTURE, BLOOD (ROUTINE X 2)  CULTURE, BLOOD (ROUTINE X 2)  BASIC METABOLIC PANEL  TROPONIN I  CBC WITH DIFFERENTIAL/PLATELET  PROTIME-INR  URINALYSIS, ROUTINE W REFLEX MICROSCOPIC  I-STAT TROPOININ, ED  I-STAT CG4 LACTIC ACID, ED  CBG MONITORING, ED    EKG  EKG Interpretation None       Radiology No results found.  Procedures Procedures (including critical care time)  Medications Ordered in ED Medications  aspirin chewable tablet 324 mg (not administered)     Initial Impression / Assessment and Plan / ED Course  I have reviewed the triage vital signs and the nursing notes.  Pertinent labs & imaging results that were available during my care of the patient were reviewed by me and considered in my medical decision making (see chart for details).     11:09 AM  Patient with mild ST elevation in II, III, and F aVF as well as peaked T waves and the anterior lateral leads. These changes appear new from his prior EKG.The patient's history however with his pain being worse with coughing and deep breathing and are consistent with a cardiac etiology. We'll have the cardiologist on-call reviewed the EKG  11:18 AM Discuss case with Dr. Gwenlyn Found, cardiologist on  call, who reviewed the patient's EKG and agrees that the patient doesn't meet criteria for acute STEMI. He recommends that patient receive heparin as well as his enzymes be cycled and inpatient echo in hospitalist admission and to be recontacted if the patient's condition should change.  12:49 PM Patient seen by cardiology who agrees that this is not a STEMI and likely pericarditis as patient's pain is worse with lying flat and better with sitting up. He also has a multitude. Will treat with Toradol and will be admitted by the hospitalist   CRITICAL CARE Performed by: Leota Jacobsen Total critical care time: 50 minutes Critical care time was exclusive of separately billable procedures and treating other patients. Critical care was necessary to treat or prevent imminent or life-threatening deterioration. Critical care was time spent personally by me on the following activities: development of treatment plan with patient and/or surrogate as well as nursing, discussions with consultants, evaluation of patient's response to treatment, examination of patient, obtaining history from patient or surrogate, ordering and performing treatments and interventions, ordering and review of laboratory studies, ordering  and review of radiographic studies, pulse oximetry and re-evaluation of patient's condition.   Final Clinical Impressions(s) / ED Diagnoses   Final diagnoses:  SOB (shortness of breath)    New Prescriptions New Prescriptions   No medications on file     Lacretia Leigh, MD 09/28/16 1250

## 2016-09-28 NOTE — Consult Note (Addendum)
Cardiology Consultation:   Patient ID: KALIB BHAGAT; [id number]; 1970-06-21   Admit date: 09/28/2016 Date of Consult: 09/28/2016  Primary Care Provider: Patient, No Pcp Per Primary Cardiologist: New Primary Electrophysiologist:  None   Patient Profile:   OSWELL SAY is a 46 y.o. male with a hx of Bronchogenic lung cancer who is being seen today for the evaluation of chest pain at the request of Dr Lacretia Leigh.  History of Present Illness:   CAYNE YOM is a 46 year old male with stage IIIa non-small cell lung cancer, squamous cell carcinoma of originally diagnosed in December 2017 with almost: Complete collapse of the left lung, seen by Dr. Cyndia Bent previously, currently undergoing immunotherapy under the direction of Dr. Julien Nordmann.  He presented today to the [facility] emergency department after calling [place] describing a temperature of 103.2, shortness of breath, chest pain. Here he states that he developed severe 10 over 10 chest discomfort at approximate 4 AM in his chest, upper back worse with movement and deep inspiration. Currently he is in upright position, stating that this is most comfortable position to be in. When he lays flat his pain increases. He states that when he coughs his pain increases as well. He may be feeling a little bit better as he is currently starting a [place] sandwich.  He denies any prior history of pericarditis. Reminds me that his left lung had collapsed from his cancer previously. His family is at bedside. No prior cardiac illness.  Past Medical History:  Diagnosis Date  . 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 Encounter for antineoplastic chemotherapy 05/14/2016  . Goals of care, counseling/discussion 07/27/2016  . Pneumonia     Past Surgical History:  Procedure Laterality Date  . VIDEO BRONCHOSCOPY Bilateral 04/19/2016   Procedure: VIDEO BRONCHOSCOPY WITHOUT FLUORO;  Surgeon: Juanito Doom, MD;  Location: [facility];  Service:  Cardiopulmonary;  Laterality: Bilateral;     Inpatient Medications: Scheduled Meds: . ketorolac  15 mg Intravenous Once   Continuous Infusions:  PRN Meds: nitroGLYCERIN  Allergies:   No Known Allergies  Social History:   Social History   Social History  . Marital status: Single    Spouse name: N/A  . Number of children: N/A  . Years of education: N/A   Occupational History  . Not on file.   Social History Main Topics  . Smoking status: Former Smoker    Packs/day: 1.00    Types: Cigarettes    Quit date: 04/13/2016  . Smokeless tobacco: Never Used  . Alcohol use No  . Drug use: No  . Sexual activity: No   Other Topics Concern  . Not on file   Social History Narrative  . No narrative on file    Family History:   The patient's family history is not on file. No early family history of CAD.  ROS:  Please see the history of present illness.  Positive for fevers, positional chest discomfort, pain with deep inspiration, lung cancer. All other ROS reviewed and negative.     Physical Exam/Data:   Vitals:   09/28/16 1116 09/28/16 1120 09/28/16 1130 09/28/16 1200  BP: 104/61 103/63 111/64 115/70  Pulse: (!) 117 (!) 110 (!) 106 (!) 107  Resp: (!) 28 (!) 22 (!) 24 20  Temp:      TempSrc:      SpO2: 94% 96% 97% 98%   No intake or output data in the 24 hours ending 09/28/16 1302 There  were no vitals filed for this visit. There is no height or weight on file to calculate BMI.  General:  Well nourished, well developed, appears to be in mild to moderate chest discomfort currently in bed. HEENT: normal Lymph: no adenopathy Neck: no JVD Endocrine:  No thryomegaly Vascular: No carotid bruits Cardiac:  normal S1, S2; mildly tachycardic; no murmur. He does have a subtle third component to his heart sounds, possible rub Lungs:  Decreased BS on left, increased effort, difficult to taken a deep inspiration because of pain Abd: soft, nontender, no hepatomegaly  Ext: no  edema Musculoskeletal:  No deformities, BUE and BLE strength normal and equal Skin: warm and dry  Neuro:  CNs 2-12 intact, no focal abnormalities noted Psych:  Normal affect    EKG:  The EKG was personally reviewed and demonstrates 09/28/16-sinus rhythm with J-point elevation diffusely, accentuation of T-wave positive amplitude as well. No significant PR depression. This is a change from prior EKG.  Relevant CV Studies: CT scan of chest and 04/15/16, personal review, no significant coronary artery calcification noted. No significant aortic atherosclerosis. Collapse of left lung noted.  Laboratory Data:  Chemistry Recent Labs Lab 09/28/16 1050  NA 131*  K 4.2  CL 95*  CO2 24  GLUCOSE 123*  BUN 10  CREATININE 0.71  CALCIUM 9.1  GFRNONAA >60  GFRAA >60  ANIONGAP 12    No results for input(s): PROT, ALBUMIN, AST, ALT, ALKPHOS, BILITOT in the last 168 hours. Hematology Recent Labs Lab 09/28/16 1050  WBC 18.7*  RBC 3.84*  HGB 12.4*  HCT 36.3*  MCV 94.5  MCH 32.3  MCHC 34.2  RDW 11.8  PLT 407*   Cardiac Enzymes Recent Labs Lab 09/28/16 1050  TROPONINI <0.03    Recent Labs Lab 09/28/16 1100  TROPIPOC 0.01    BNPNo results for input(s): BNP, PROBNP in the last 168 hours.  DDimer No results for input(s): DDIMER in the last 168 hours.  Radiology/Studies:  Dg Chest Port 1 View  Result Date: 09/28/2016 CLINICAL DATA:  Shortness of breath.  Chest pain. EXAM: PORTABLE CHEST 1 VIEW COMPARISON:  CT 07/23/2016.  Chest x-ray 06/16/2016. FINDINGS: Prominent left upper lobe atelectasis again noted in this patient with known bronchogenic carcinoma. Left-sided pleural effusion. Reference is made to recent CT report for discussion of a nodules present . Heart size cannot be evaluated due to left upper lobe atelectasis. No acute bony abnormality . IMPRESSION: Prominent left upper lobe atelectasis again noted in this patient with known bronchogenic carcinoma. Small left pleural  effusion . Electronically Signed   By: Marcello Moores  Register   On: 09/28/2016 11:47    Assessment and Plan:   Acute pericarditis  - EKG and clinical scenario with fever, pleuritic type chest discomfort positional improved with more upright position consistent with acute pericarditis.   - Pulses are equal in both upper extremities.  - IV Toradol 15 mg has been ordered.   - Suggest ibuprofen 600-800 mg 3 times a day for the next 2 weeks with meals  - Colchicine 0.6 mg twice a day for 3 months  - Hopefully after anti-inflammatory ministration, his pain will subside. He is at risk for further occurrence of pericarditis. Try to avoid steroids.  - We'll order echocardiogram.  Non-small cell lung cancer  - Dr. Julien Nordmann has been treating with immunotherapy. Former radiation therapy.  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 Has seen Dr. Cyndia Bent in the past.  We will follow  along.   Signed, Candee Furbish, MD  09/28/2016 1:02 PM

## 2016-09-28 NOTE — ED Notes (Signed)
No change in pain with Nitro SL x 3.

## 2016-09-28 NOTE — Telephone Encounter (Signed)
Patient significant other called stating that patient has a temp of 103.2, SOB, and chest pain. RN informed patient that he should go immediately to the ER to be evaluated. Patient SO verbalized understanding.

## 2016-09-28 NOTE — H&P (Addendum)
HISTORY AND PHYSICAL       PATIENT DETAILS Name: Douglas Kelly Age: 46 y.o. Sex: male Date of Birth: [birth year] Admit Date: 09/28/2016 [id number], No Pcp Per   Patient coming from: Home   CHIEF COMPLAINT:  Chest pain since this morning  HPI: Douglas Kelly is a 46 y.o. male with medical history significant of squamous cell cancer of the lung-currently on chemotherapy and immunotherapy-presented to the hospital for evaluation of the above-noted complaints. Please note, patient has a history of chronic left upper lobe lung collapse. This morning, he woke up around 4 AM with bilateral chest pain, that has been gradually worsening over the past few hours. Pain is described as dull/sharp, aggravated by coughing and taking deep breaths, he thinks that leaning forward also worsens the pain. Patient has not been relieved by nitroglycerin that was given to him in the emergency department. He does complain of mild shortness of breath, but denies any fever, diaphoresis or palpitations. There is no history of nausea, vomiting  He was evaluated in the ED, where he was found to have diffuse ST segment elevation in his 12-lead EKG-cardiology was consulted-this M.D. spoke with Dr. Marlou Porch, he picked this patient probably has pericarditis, recommends inpatient evaluation and treatment with ibuprofen, and colchicine. The hospitalist service was consulted for admission and further treatment.   ED Course:  See above  Note: Lives at: Home Mobility:Independent Chronic Indwelling Foley:no   REVIEW OF SYSTEMS:  Constitutional:   No  weight loss, night sweats,  Fevers, chills  HEENT:    No headaches, Dysphagia,Tooth/dental problems,Sore throat  Cardio-vascular: No Orthopnea, PND,lower extremity edema, anasarca, palpitations  GI:  No heartburn, indigestion, abdominal pain, nausea, vomiting, diarrhea, melena or hematochezia  Resp: No hemoptysis  Skin:  No rash or lesions.  GU:  No  dysuria, change in color of urine, no urgency or frequency.  No flank pain.  Musculoskeletal: No joint pain or swelling.  No decreased range of motion.  No back pain.  Endocrine: No heat intolerance, no cold intolerance, no polyuria, no polydipsia  Psych: No change in mood or affect. No depression or anxiety.  No memory loss.   ALLERGIES:  No Known Allergies  PAST MEDICAL HISTORY: Past Medical History:  Diagnosis Date  . 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 Encounter for antineoplastic chemotherapy 05/14/2016  . Goals of care, counseling/discussion 07/27/2016  . Pneumonia     PAST SURGICAL HISTORY: Past Surgical History:  Procedure Laterality Date  . VIDEO BRONCHOSCOPY Bilateral 04/19/2016   Procedure: VIDEO BRONCHOSCOPY WITHOUT FLUORO;  Surgeon: Juanito Doom, MD;  Location: [facility];  Service: Cardiopulmonary;  Laterality: Bilateral;    MEDICATIONS AT HOME: Prior to Admission medications   Medication Sig Start Date End Date Taking? Authorizing Provider  Multiple Vitamin (MULTIVITAMIN WITH MINERALS) TABS tablet Take 1 tablet by mouth daily.   Yes [provider]  bisacodyl (BISACODYL) 5 MG EC tablet Take 1 tablet (5 mg total) by mouth daily as needed for moderate constipation. Patient not taking: Reported on 07/27/2016 04/23/16   Janece Canterbury, MD  nicotine (NICODERM CQ - DOSED IN MG/24 HOURS) 21 mg/24hr patch Place 1 patch (21 mg total) onto the skin daily. Patient not taking: Reported on 08/18/2016 04/23/16   Janece Canterbury, MD  prochlorperazine (COMPAZINE) 10 MG tablet Take 1 tablet (10 mg total) by mouth every 6 (six) hours as needed for nausea or vomiting. Patient not taking: Reported on 09/15/2016 08/18/16  Curt Bears, MD    FAMILY HISTORY: No family history of lung cancer  SOCIAL HISTORY:  reports that he quit smoking about 5 months ago. His smoking use included Cigarettes. He smoked 1.00 pack per day. He has never used smokeless tobacco. He  reports that he does not drink alcohol or use drugs.  PHYSICAL EXAM: Blood pressure 115/70, pulse (!) 107, temperature 99.7 F (37.6 C), temperature source Oral, resp. rate 20, SpO2 98 %.  General appearance :Awake, alert, not in any distress. Speech Clear. Not toxic Looking Eyes:, pupils equally reactive to light and accomodation,no scleral icterus. HEENT: Atraumatic and Normocephalic Neck: supple, no JVD. No cervical lymphadenopathy. Resp: Decreased air entry in the left lung area, but lungs otherwise clear to auscultation  CVS: S1 S2 regular, no murmurs.  GI: Bowel sounds present, Non tender and not distended with no gaurding, rigidity or rebound.No organomegaly Extremities: B/L Lower Ext shows no edema, both legs are warm to touch Neurology:  speech clear,Non focal, sensation is grossly intact. Psychiatric: Normal judgment and insight. Alert and oriented x 3. Normal mood. Musculoskeletal:gait appears to be normal.No digital cyanosis Skin:No Rash, warm and dry Wounds:N/A  LABS ON ADMISSION:  I have personally reviewed following labs and imaging studies  CBC:  Recent Labs Lab 09/28/16 1050  WBC 18.7*  NEUTROABS 17.0*  HGB 12.4*  HCT 36.3*  MCV 94.5  PLT 407*    Basic Metabolic Panel:  Recent Labs Lab 09/28/16 1050  NA 131*  K 4.2  CL 95*  CO2 24  GLUCOSE 123*  BUN 10  CREATININE 0.71  CALCIUM 9.1    GFR: Estimated Creatinine Clearance: 109.3 mL/min (by C-G formula based on SCr of 0.71 mg/dL).  Liver Function Tests: No results for input(s): AST, ALT, ALKPHOS, BILITOT, PROT, ALBUMIN in the last 168 hours. No results for input(s): LIPASE, AMYLASE in the last 168 hours. No results for input(s): AMMONIA in the last 168 hours.  Coagulation Profile:  Recent Labs Lab 09/28/16 1050  INR 1.03    Cardiac Enzymes:  Recent Labs Lab 09/28/16 1050  TROPONINI <0.03    BNP (last 3 results) No results for input(s): PROBNP in the last 8760  hours.  HbA1C: No results for input(s): HGBA1C in the last 72 hours.  CBG: No results for input(s): GLUCAP in the last 168 hours.  Lipid Profile: No results for input(s): CHOL, HDL, LDLCALC, TRIG, CHOLHDL, LDLDIRECT in the last 72 hours.  Thyroid Function Tests: No results for input(s): TSH, T4TOTAL, FREET4, T3FREE, THYROIDAB in the last 72 hours.  Anemia Panel: No results for input(s): VITAMINB12, FOLATE, FERRITIN, TIBC, IRON, RETICCTPCT in the last 72 hours.  Urine analysis:    Component Value Date/Time   COLORURINE YELLOW 04/15/2016 1908   APPEARANCEUR CLEAR 04/15/2016 1908   LABSPEC >1.046 (H) 04/15/2016 1908   PHURINE 7.5 04/15/2016 1908   GLUCOSEU NEGATIVE 04/15/2016 1908   HGBUR NEGATIVE 04/15/2016 1908   BILIRUBINUR NEGATIVE 04/15/2016 1908   KETONESUR NEGATIVE 04/15/2016 1908   PROTEINUR NEGATIVE 04/15/2016 1908   NITRITE NEGATIVE 04/15/2016 1908   LEUKOCYTESUR NEGATIVE 04/15/2016 1908    Sepsis Labs: Lactic Acid, Venous    Component Value Date/Time   LATICACIDVEN 1.51 09/28/2016 1129     Microbiology: No results found for this or any previous visit (from the past 240 hour(s)).    RADIOLOGIC STUDIES ON ADMISSION: Dg Chest Port 1 View  Result Date: 09/28/2016 CLINICAL DATA:  Shortness of breath.  Chest pain. EXAM: PORTABLE CHEST 1  VIEW COMPARISON:  CT 07/23/2016.  Chest x-ray 06/16/2016. FINDINGS: Prominent left upper lobe atelectasis again noted in this patient with known bronchogenic carcinoma. Left-sided pleural effusion. Reference is made to recent CT report for discussion of a nodules present . Heart size cannot be evaluated due to left upper lobe atelectasis. No acute bony abnormality . IMPRESSION: Prominent left upper lobe atelectasis again noted in this patient with known bronchogenic carcinoma. Small left pleural effusion . Electronically Signed   By: Marcello Moores  Register   On: 09/28/2016 11:47    I have personally reviewed images of chest xray-left  upper lobe collapse  EKG:  Personally reviewed. Sinus tachycardia-diffuse ST segment elevation  ASSESSMENT AND PLAN: Chest pain likely secondary to pericarditis: Already evaluated by cardiology-thought to have pericarditis-recommendations are for Toradol 50 mg 1, along with ibuprofen 600 mg 3 times a day for 2 weeks, and colchicine for 3-4 months. We will check echocardiogram, as needed narcotics if pain is not relieved by above measures. Etiology of pericarditis not clear-not sure if this is due to recent radiation/chemotherapy or immunotherapy.  Left upper lobe lung collapse: Known history of mass in the left mainstem bronchus-recent CT chest in March also showed left upper lobe lung collapse. He apparently finished concurrent chemoradiation, and is currently only on immunotherapy.   Squamous cell cancer of the lung: See above-we will try to reach patient's oncologist over the phone-and see if he thinks patient's presentation can be attributed to his chemotherapeutic agents.  Further plan will depend as patient's clinical course evolves and further radiologic and laboratory data become available. Patient will be monitored closely.  Above noted plan was discussed with patient/mother face to face at bedside, they were in agreement.   CONSULTS: None  DVT Prophylaxis: Prophylactic Lovenox  Code Status: Full Code  Disposition Plan:  Discharge back home  possibly in 1-2 days, depending on clinical course  Admission status: Observation going to tele  Total time spent 45 minutes.Greater than 50% of this time was spent in counseling, explanation of diagnosis, planning of further management, and coordination of care.  Oren Binet [facility] Pager [phone]  If 7PM-7AM, please contact night-coverage [email or web address] Password [email or web address] 09/28/2016, 1:04 PM

## 2016-09-28 NOTE — ED Triage Notes (Signed)
Pt c/o SOB, chest pain, onset today at 0400 after sharp severe upper medial back pain woke pt from sleep. Hx lung and squamous cancer. Inspiration causes pain to chest and back. Gets chemo. Hx lung collapse.

## 2016-09-28 NOTE — Progress Notes (Signed)
*  PRELIMINARY RESULTS* Vascular Ultrasound Bilateral lower extremity venous duplex has been completed.  Preliminary findings: No evidence of deep vein thrombosis or baker's cysts bilaterally.   Everrett Coombe 09/28/2016, 2:58 PM

## 2016-09-29 ENCOUNTER — Ambulatory Visit: Payer: Medicaid Other | Admitting: Internal Medicine

## 2016-09-29 ENCOUNTER — Ambulatory Visit: Payer: Medicaid Other

## 2016-09-29 ENCOUNTER — Other Ambulatory Visit: Payer: Medicaid Other

## 2016-09-29 ENCOUNTER — Observation Stay (HOSPITAL_COMMUNITY): Payer: Medicaid Other

## 2016-09-29 ENCOUNTER — Observation Stay (HOSPITAL_BASED_OUTPATIENT_CLINIC_OR_DEPARTMENT_OTHER): Payer: Medicaid Other

## 2016-09-29 ENCOUNTER — Telehealth: Payer: Self-pay | Admitting: Internal Medicine

## 2016-09-29 DIAGNOSIS — I319 Disease of pericardium, unspecified: Secondary | ICD-10-CM

## 2016-09-29 DIAGNOSIS — R079 Chest pain, unspecified: Secondary | ICD-10-CM

## 2016-09-29 DIAGNOSIS — E43 Unspecified severe protein-calorie malnutrition: Secondary | ICD-10-CM

## 2016-09-29 DIAGNOSIS — Z87891 Personal history of nicotine dependence: Secondary | ICD-10-CM | POA: Diagnosis not present

## 2016-09-29 DIAGNOSIS — I318 Other specified diseases of pericardium: Secondary | ICD-10-CM | POA: Diagnosis not present

## 2016-09-29 DIAGNOSIS — C3492 Malignant neoplasm of unspecified part of left bronchus or lung: Secondary | ICD-10-CM | POA: Diagnosis not present

## 2016-09-29 DIAGNOSIS — R042 Hemoptysis: Secondary | ICD-10-CM

## 2016-09-29 DIAGNOSIS — Z923 Personal history of irradiation: Secondary | ICD-10-CM | POA: Diagnosis not present

## 2016-09-29 DIAGNOSIS — I300 Acute nonspecific idiopathic pericarditis: Secondary | ICD-10-CM | POA: Diagnosis not present

## 2016-09-29 DIAGNOSIS — Z79899 Other long term (current) drug therapy: Secondary | ICD-10-CM | POA: Diagnosis not present

## 2016-09-29 DIAGNOSIS — R609 Edema, unspecified: Secondary | ICD-10-CM | POA: Diagnosis not present

## 2016-09-29 DIAGNOSIS — C3412 Malignant neoplasm of upper lobe, left bronchus or lung: Secondary | ICD-10-CM | POA: Diagnosis present

## 2016-09-29 DIAGNOSIS — J9811 Atelectasis: Secondary | ICD-10-CM | POA: Diagnosis not present

## 2016-09-29 DIAGNOSIS — I309 Acute pericarditis, unspecified: Secondary | ICD-10-CM | POA: Diagnosis not present

## 2016-09-29 LAB — ECHOCARDIOGRAM COMPLETE
Height: 68 in
Weight: 2412.71 oz

## 2016-09-29 LAB — CBC
HEMATOCRIT: 32.9 % — AB (ref 39.0–52.0)
Hemoglobin: 11.1 g/dL — ABNORMAL LOW (ref 13.0–17.0)
MCH: 32.3 pg (ref 26.0–34.0)
MCHC: 33.7 g/dL (ref 30.0–36.0)
MCV: 95.6 fL (ref 78.0–100.0)
PLATELETS: 312 10*3/uL (ref 150–400)
RBC: 3.44 MIL/uL — ABNORMAL LOW (ref 4.22–5.81)
RDW: 12.1 % (ref 11.5–15.5)
WBC: 9.9 10*3/uL (ref 4.0–10.5)

## 2016-09-29 LAB — BASIC METABOLIC PANEL
Anion gap: 8 (ref 5–15)
BUN: 12 mg/dL (ref 6–20)
CHLORIDE: 100 mmol/L — AB (ref 101–111)
CO2: 27 mmol/L (ref 22–32)
Calcium: 8.6 mg/dL — ABNORMAL LOW (ref 8.9–10.3)
Creatinine, Ser: 0.81 mg/dL (ref 0.61–1.24)
GFR calc Af Amer: >60 mL/min (ref >60)
GFR calc non Af Amer: >60 mL/min (ref >60)
GLUCOSE: 160 mg/dL — AB (ref 65–99)
POTASSIUM: 4.1 mmol/L (ref 3.5–5.1)
SODIUM: 135 mmol/L (ref 135–145)

## 2016-09-29 LAB — TROPONIN I

## 2016-09-29 MED ORDER — OXYCODONE HCL 5 MG PO TABS
10.0000 mg | ORAL_TABLET | ORAL | Status: DC | PRN
  Administered 2016-09-29 – 2016-10-01 (×6): 10 mg via ORAL
  Filled 2016-09-29 (×6): qty 2

## 2016-09-29 MED ORDER — IOPAMIDOL (ISOVUE-370) INJECTION 76%: 100.0000 mL | Freq: Once | INTRAVENOUS | Status: DC | PRN

## 2016-09-29 MED ORDER — KETOROLAC TROMETHAMINE 15 MG/ML IJ SOLN
15.0000 mg | Freq: Four times a day (QID) | INTRAMUSCULAR | Status: AC | PRN
  Administered 2016-09-29 – 2016-09-30 (×3): 15 mg via INTRAVENOUS
  Filled 2016-09-29 (×3): qty 1

## 2016-09-29 MED ORDER — IOPAMIDOL (ISOVUE-370) INJECTION 76%
INTRAVENOUS | Status: AC
  Administered 2016-09-29: 100 mL
  Filled 2016-09-29: qty 100

## 2016-09-29 MED ORDER — GUAIFENESIN-DM 100-10 MG/5ML PO SYRP
5.0000 mL | ORAL_SOLUTION | ORAL | Status: DC | PRN
  Administered 2016-09-29 – 2016-10-01 (×6): 5 mL via ORAL
  Filled 2016-09-29 (×6): qty 10

## 2016-09-29 NOTE — Progress Notes (Addendum)
[facility] PROGRESS NOTE    Progress Note  RANCE SMITHSON  NOM:[id number] DOB: Feb 03, 1971 DOA: 09/28/2016 PCP: Patient, No Pcp Per     Brief Narrative:   Douglas Kelly is an 46 y.o. male past medical history of squamous cell cancer on chemotherapy comes in for chest pain compatible with acute peritonitis.  Assessment/Plan:   Acute Pericarditis 2-D echo is pending, continue ibuprofen 3 times a day and colchicine for 4 months.Toradol IV when necessary Unclear etiology and concern about malignant effusion. Lower extremity Doppler was negative for DVT.  Collapse of left lung, hx of pneumonia 5 months ago: Known history of left main stem bronchus and CT in March.  Protein-calorie malnutrition, severe Continue ensure 3 times a day.  Stage III squamous cell carcinoma of left lung ([place]) Consult Oncologist.  Hemoptysis: Get a CT angio of the chest rule out PE and reevaluate squamous cell malignancy of the lung.  DVT prophylaxis: SCD's Family Communication:none Disposition Plan/Barrier to D/C: unable to determine Code Status:     Code Status Orders        Start     Ordered   09/28/16 1355  Full code  Continuous     09/28/16 1355    Code Status History    Date Active Date Inactive Code Status Order ID Comments User Context   05/22/2016  5:42 PM 06/02/2016 11:43 AM Full Code [id number]  Gaye Pollack, MD ED   04/15/2016 10:01 PM 04/23/2016  5:06 PM Full Code [id number]  Roney Jaffe, MD Inpatient        IV Access:    Peripheral IV   Procedures and diagnostic studies:   Dg Chest Port 1 View  Result Date: 09/28/2016 CLINICAL DATA:  Shortness of breath.  Chest pain. EXAM: PORTABLE CHEST 1 VIEW COMPARISON:  CT 07/23/2016.  Chest x-ray 06/16/2016. FINDINGS: Prominent left upper lobe atelectasis again noted in this patient with known bronchogenic carcinoma. Left-sided pleural effusion. Reference is made to recent CT report for discussion of a nodules present . Heart  size cannot be evaluated due to left upper lobe atelectasis. No acute bony abnormality . IMPRESSION: Prominent left upper lobe atelectasis again noted in this patient with known bronchogenic carcinoma. Small left pleural effusion . Electronically Signed   By: Marcello Moores  Register   On: 09/28/2016 11:47     Medical Consultants:    None.  Anti-Infectives:   none  Subjective:    JERMANY RIMEL he relates he is  having hemoptysis. Continues to have chest pain which is improved by ketorolac  Objective:    Vitals:   09/28/16 1519 09/28/16 2038 09/29/16 0423 09/29/16 0753  BP: 106/62 101/69 106/72   Pulse: (!) 105 99 (!) 102   Resp: (!) 22 20 18    Temp: 98.3 F (36.8 C) 97.7 F (36.5 C) 97.6 F (36.4 C)   TempSrc: Axillary Oral Oral   SpO2: 98% 96% 96% 97%  Weight:      Height:        Intake/Output Summary (Last 24 hours) at 09/29/16 0821 Last data filed at 09/29/16 0600  Gross per 24 hour  Intake                0 ml  Output                0 ml  Net                0 ml   [employer]  09/28/16 1500  Weight: 68.4 kg (150 lb 12.7 oz)    Exam: General exam: In no acute distress Respiratory system: Good air movement and clear to auscultation Cardiovascular system: Regular in rhythm with a positive S1-S2 Gastrointestinal system: Positive bowel sounds nontender nondistended Central nervous system: Alert and oriented Extremities: No edema Skin: No rashes Psychiatry: Mood and affect are appropriate.   Data Reviewed:    Labs: Basic Metabolic Panel:  Recent Labs Lab 09/28/16 1050 09/28/16 1401 09/29/16 0202  NA 131*  --  135  K 4.2  --  4.1  CL 95*  --  100*  CO2 24  --  27  GLUCOSE 123*  --  160*  BUN 10  --  12  CREATININE 0.71 0.87 0.81  CALCIUM 9.1  --  8.6*   GFR Estimated Creatinine Clearance: 107.9 mL/min (by C-G formula based on SCr of 0.81 mg/dL). Liver Function Tests: No results for input(s): AST, ALT, ALKPHOS, BILITOT, PROT, ALBUMIN in the last  168 hours. No results for input(s): LIPASE, AMYLASE in the last 168 hours. No results for input(s): AMMONIA in the last 168 hours. Coagulation profile  Recent Labs Lab 09/28/16 1050  INR 1.03    CBC:  Recent Labs Lab 09/28/16 1050 09/28/16 1401 09/29/16 0202  WBC 18.7* 14.6* 9.9  NEUTROABS 17.0*  --   --   HGB 12.4* 11.4* 11.1*  HCT 36.3* 33.5* 32.9*  MCV 94.5 94.9 95.6  PLT 407* 349 312   Cardiac Enzymes:  Recent Labs Lab 09/28/16 1050 09/28/16 1401 09/28/16 1944 09/29/16 0202  TROPONINI <0.03 <0.03 <0.03 <0.03   BNP (last 3 results) No results for input(s): PROBNP in the last 8760 hours. CBG: No results for input(s): GLUCAP in the last 168 hours. D-Dimer: No results for input(s): DDIMER in the last 72 hours. Hgb A1c: No results for input(s): HGBA1C in the last 72 hours. Lipid Profile: No results for input(s): CHOL, HDL, LDLCALC, TRIG, CHOLHDL, LDLDIRECT in the last 72 hours. Thyroid function studies: No results for input(s): TSH, T4TOTAL, T3FREE, THYROIDAB in the last 72 hours.  Invalid input(s): FREET3 Anemia work up: No results for input(s): VITAMINB12, FOLATE, FERRITIN, TIBC, IRON, RETICCTPCT in the last 72 hours. Sepsis Labs:  Recent Labs Lab 09/28/16 1050 09/28/16 1129 09/28/16 1401 09/29/16 0202  WBC 18.7*  --  14.6* 9.9  LATICACIDVEN  --  1.51  --   --    Microbiology No results found for this or any previous visit (from the past 240 hour(s)).   Medications:   . colchicine  0.6 mg Oral BID  . enoxaparin (LOVENOX) injection  40 mg Subcutaneous Q24H  . ibuprofen  600 mg Oral Q8H  . pantoprazole  40 mg Oral Daily  . sodium chloride flush  3 mL Intravenous Q12H   Continuous Infusions: . sodium chloride      Time spent: 25 min   LOS: 0 days   Charlynne Cousins  [facility] Pager [phone]  *Please refer to [email or web address], password [email or web address] to get updated schedule on who will round on this patient, as hospitalists switch teams  weekly. If 7PM-7AM, please contact night-coverage at [email or web address], password [email or web address] for any overnight needs.  09/29/2016, 8:21 AM

## 2016-09-29 NOTE — Telephone Encounter (Signed)
Patient called to see about his appointments today, he is in the hospital at [place].  He said they admitted him for fluid on his heart and lung from chemo and radiation.  He would like to know what to do about his treatment.

## 2016-09-29 NOTE — Progress Notes (Addendum)
Progress Note  Patient Name: Douglas Kelly Date of Encounter: 09/29/2016  Primary Cardiologist: New - Dr. Marlou Porch  Subjective   Pt states his chest pain has not been relieved with current medication. He also reports shortness of breath.  Inpatient Medications    Scheduled Meds: . colchicine  0.6 mg Oral BID  . enoxaparin (LOVENOX) injection  40 mg Subcutaneous Q24H  . ibuprofen  600 mg Oral Q8H  . pantoprazole  40 mg Oral Daily  . sodium chloride flush  3 mL Intravenous Q12H   Continuous Infusions: . sodium chloride     PRN Meds: sodium chloride, acetaminophen **OR** acetaminophen, albuterol, guaiFENesin-dextromethorphan, HYDROmorphone (DILAUDID) injection, ondansetron **OR** ondansetron (ZOFRAN) IV, oxyCODONE, sodium chloride flush   Vital Signs    Vitals:   09/28/16 1519 09/28/16 2038 09/29/16 0423 09/29/16 0753  BP: 106/62 101/69 106/72   Pulse: (!) 105 99 (!) 102   Resp: (!) '22 20 18   '$ Temp: 98.3 F (36.8 C) 97.7 F (36.5 C) 97.6 F (36.4 C)   TempSrc: Axillary Oral Oral   SpO2: 98% 96% 96% 97%  Weight:      Height:        Intake/Output Summary (Last 24 hours) at 09/29/16 0755 Last data filed at 09/29/16 0600  Gross per 24 hour  Intake                0 ml  Output                0 ml  Net                0 ml   Filed Weights   09/28/16 1500  Weight: 150 lb 12.7 oz (68.4 kg)     Physical Exam   General: Well developed, well nourished, male appearing in no acute distress. Head: Normocephalic, atraumatic.  Neck: Supple without bruits, no JVD Lungs:  Resp regular, clear on right lung fields, crackles throughout left, not moving air on left Heart: Regular rhythm, tachycardic rate, S1, S2, possible rub. Abdomen: Soft, non-tender, non-distended with normoactive bowel sounds. No hepatomegaly. No rebound/guarding. No obvious abdominal masses. Extremities: No clubbing, cyanosis, no edema. Distal pedal pulses are 2+ bilaterally. Neuro: Alert and oriented X 3.  Moves all extremities spontaneously. Psych: Normal affect.  Labs    Chemistry Recent Labs Lab 09/28/16 1050 09/28/16 1401 09/29/16 0202  NA 131*  --  135  K 4.2  --  4.1  CL 95*  --  100*  CO2 24  --  27  GLUCOSE 123*  --  160*  BUN 10  --  12  CREATININE 0.71 0.87 0.81  CALCIUM 9.1  --  8.6*  GFRNONAA >60 >60 >60  GFRAA >60 >60 >60  ANIONGAP 12  --  8     Hematology Recent Labs Lab 09/28/16 1050 09/28/16 1401 09/29/16 0202  WBC 18.7* 14.6* 9.9  RBC 3.84* 3.53* 3.44*  HGB 12.4* 11.4* 11.1*  HCT 36.3* 33.5* 32.9*  MCV 94.5 94.9 95.6  MCH 32.3 32.3 32.3  MCHC 34.2 34.0 33.7  RDW 11.8 12.0 12.1  PLT 407* 349 312    Cardiac Enzymes Recent Labs Lab 09/28/16 1050 09/28/16 1401 09/28/16 1944 09/29/16 0202  TROPONINI <0.03 <0.03 <0.03 <0.03    Recent Labs Lab 09/28/16 1100  TROPIPOC 0.01     BNPNo results for input(s): BNP, PROBNP in the last 168 hours.   DDimer No results for input(s): DDIMER in the last 168  hours.   Radiology    Dg Chest Port 1 View  Result Date: 09/28/2016 CLINICAL DATA:  Shortness of breath.  Chest pain. EXAM: PORTABLE CHEST 1 VIEW COMPARISON:  CT 07/23/2016.  Chest x-ray 06/16/2016. FINDINGS: Prominent left upper lobe atelectasis again noted in this patient with known bronchogenic carcinoma. Left-sided pleural effusion. Reference is made to recent CT report for discussion of a nodules present . Heart size cannot be evaluated due to left upper lobe atelectasis. No acute bony abnormality . IMPRESSION: Prominent left upper lobe atelectasis again noted in this patient with known bronchogenic carcinoma. Small left pleural effusion . Electronically Signed   By: Marcello Moores  Register   On: 09/28/2016 11:47     Telemetry    NSR with rates in the 100s - Personally Reviewed  ECG    No new tracings - Personally Reviewed   Cardiac Studies   Echocardiogram 09/29/16: pending  Patient Profile     46 y.o. male has a PMH significant for  bronchogenic lung cancer. He presented with pleuritic chest pain consistent with pericarditis.  Assessment & Plan    1. Acute pericarditis - echocardiogram pending - leukocytosis is resolving, he is afebrile but tachycardic overnight - continue colchicine - patient states he is still 10/10 chest pain and SOB - he responded well to toradol. sCr 0.81. Ordered toradol PRN x 3 doses   2. Left lung collapse - may consider consult to CT surgery for possible chest tube vs pleurx - this lung collapse may be contributing to his pain and is likely contributing to his SOB   Signed, Ledora Bottcher , PA-C 7:55 AM 09/29/2016 Pager: [phone]  Personally seen and examined. Agree with above. No left sided lung sounds. Pt in pain. Hemoptysis CXR with opacification of left lung field Put in call to Dr. Cyndia Bent to review film.  Spoke to Dr. Olevia Bowens  - he is obtaining CT of chest Toradol has helped. Continue NSAID ECHO with moderate pericardial effusion - no tamponade physiology.   Candee Furbish, MD

## 2016-09-29 NOTE — Telephone Encounter (Signed)
He needs to keep his next appts.

## 2016-09-29 NOTE — Progress Notes (Signed)
  Echocardiogram 2D Echocardiogram has been performed.  Douglas Kelly 09/29/2016, 12:19 PM

## 2016-09-30 ENCOUNTER — Other Ambulatory Visit: Payer: Self-pay | Admitting: Student

## 2016-09-30 DIAGNOSIS — I308 Other forms of acute pericarditis: Secondary | ICD-10-CM

## 2016-09-30 DIAGNOSIS — I313 Pericardial effusion (noninflammatory): Secondary | ICD-10-CM

## 2016-09-30 DIAGNOSIS — C3492 Malignant neoplasm of unspecified part of left bronchus or lung: Secondary | ICD-10-CM

## 2016-09-30 DIAGNOSIS — I3139 Other pericardial effusion (noninflammatory): Secondary | ICD-10-CM

## 2016-09-30 MED ORDER — INDOMETHACIN 25 MG PO CAPS
50.0000 mg | ORAL_CAPSULE | Freq: Three times a day (TID) | ORAL | Status: DC
  Administered 2016-09-30 – 2016-10-01 (×3): 50 mg via ORAL
  Filled 2016-09-30 (×4): qty 2

## 2016-09-30 NOTE — Progress Notes (Signed)
[facility] PROGRESS NOTE    Progress Note  Douglas Kelly  VQM:[id number] DOB: 11-07-1970 DOA: 09/28/2016 PCP: Patient, No Pcp Per     Brief Narrative:   Douglas Kelly is an 46 y.o. male past medical history of squamous cell cancer on chemotherapy comes in for chest pain compatible with acute peritonitis.  Assessment/Plan:   Acute Pericarditis 2-D echo is Pericardial effusion with no physiologic tamponade. Unclear etiology and concern about malignant effusion.Cardiology to discuss with CT surgery whether any physical intervention as needed. Lower extremity Doppler was negative for DVT. Appreciate cardiology's assistance.  Collapse of left lung, hx of pneumonia 5 months ago: Known history of left main stem bronchus. Some mild hemoptysis, CT image of the chest does not showed a PE. Chronic and no further intervention at this point.  Protein-calorie malnutrition, severe Continue ensure 3 times a day.  Stage III squamous cell carcinoma of left lung [facility]) Discussed this case with the oncologist over the phone and he recommended no further interventions.  Hemoptysis: Get a CT angio of the chest rule out PE and reevaluate squamous cell malignancy of the lung. Resolved.  DVT prophylaxis: SCD's Family Communication:none Disposition Plan/Barrier to D/C: unable to determine Code Status:     Code Status Orders        Start     Ordered   09/28/16 1355  Full code  Continuous     09/28/16 1355    Code Status History    Date Active Date Inactive Code Status Order ID Comments User Context   05/22/2016  5:42 PM 06/02/2016 11:43 AM Full Code [id number]  Gaye Pollack, MD ED   04/15/2016 10:01 PM 04/23/2016  5:06 PM Full Code [id number]  Roney Jaffe, MD Inpatient        IV Access:    Peripheral IV   Procedures and diagnostic studies:   Ct Angio Chest Pe W Or Wo Contrast  Result Date: 09/29/2016 CLINICAL DATA:  Lung cancer diagnosed December 2017. Radiation therapy  completed. Chemotherapy ongoing. Shortness of breath and cough. EXAM: CT ANGIOGRAPHY CHEST WITH CONTRAST TECHNIQUE: Multidetector CT imaging of the chest was performed using the standard protocol during bolus administration of intravenous contrast. Multiplanar CT image reconstructions and MIPs were obtained to evaluate the vascular anatomy. CONTRAST:  100 ml Omnipaque 300. COMPARISON:  Chest CT 07/23/2016 and 05/30/2016. FINDINGS: Cardiovascular: The pulmonary arteries are well opacified with contrast to the level of the subsegmental branches. There is no evidence of acute pulmonary embolism. There is attenuation of the left upper lobe pulmonary artery branches, similar to the previous study. No systemic arterial abnormalities are seen. The heart size is normal. There is a new moderate-sized pericardial effusion. Mediastinum/Nodes: There are no enlarged mediastinal, hilar or axillary lymph nodes. The thyroid gland, trachea and esophagus demonstrate no significant findings. Lungs/Pleura: There are small bilateral pleural effusions. Degree of volume loss in the left hemithorax is similar to the previous study. There is persistent complete opacification of the left upper lobe. There is increased left lower lobe bronchiectasis and peribronchial opacity, likely related to radiation therapy. There is mildly increased atelectasis at the right lung base. These opacities partially obscure the previously demonstrated small pulmonary nodules. No enlarging nodules are seen. Upper abdomen: The visualized upper abdomen appears stable without suspicious findings. Musculoskeletal/Chest wall: There is no chest wall mass or suspicious osseous finding. Review of the MIP images confirms the above findings. IMPRESSION: 1. No evidence of acute pulmonary embolism. 2. New moderate-sized pericardial  effusion and small bilateral pleural effusions. 3. Persistent complete left upper lobe collapse with increasing peribronchial opacities in the  left lower lobe, probably related to radiation therapy. Electronically Signed   By: Richardean Sale M.D.   On: 09/29/2016 15:51   Dg Chest Port 1 View  Result Date: 09/28/2016 CLINICAL DATA:  Shortness of breath.  Chest pain. EXAM: PORTABLE CHEST 1 VIEW COMPARISON:  CT 07/23/2016.  Chest x-ray 06/16/2016. FINDINGS: Prominent left upper lobe atelectasis again noted in this patient with known bronchogenic carcinoma. Left-sided pleural effusion. Reference is made to recent CT report for discussion of a nodules present . Heart size cannot be evaluated due to left upper lobe atelectasis. No acute bony abnormality . IMPRESSION: Prominent left upper lobe atelectasis again noted in this patient with known bronchogenic carcinoma. Small left pleural effusion . Electronically Signed   By: Marcello Moores  Register   On: 09/28/2016 11:47     Medical Consultants:    None.  Anti-Infectives:   none  Subjective:    Douglas Kelly hemoptysis has resolved, he relates his pain is not improved.  Objective:    Vitals:   09/29/16 1349 09/29/16 1740 09/29/16 2148 09/30/16 0435  BP: 112/77  103/64 98/62  Pulse: (!) 101  (!) 104 91  Resp: 18  18 18   Temp: 98.3 F (36.8 C)  98.5 F (36.9 C) 97.9 F (36.6 C)  TempSrc: Oral  Oral Oral  SpO2: 95% 98% 98% 98%  Weight:      Height:        Intake/Output Summary (Last 24 hours) at 09/30/16 0749 Last data filed at 09/30/16 0600  Gross per 24 hour  Intake             1080 ml  Output                0 ml  Net             1080 ml   Filed Weights   09/28/16 1500  Weight: 68.4 kg (150 lb 12.7 oz)    Exam: General exam: No acute distress Respiratory system: Good air movement clear to auscultation Cardiovascular system: Regular rate and rhythm with positive S1-S2 Gastrointestinal system: Positive bowel sounds nontender nondistended Central nervous system: Nonfocal awake alert and oriented 3 Extremities: No edema Skin: No rashes   Data Reviewed:     Labs: Basic Metabolic Panel:  Recent Labs Lab 09/28/16 1050 09/28/16 1401 09/29/16 0202  NA 131*  --  135  K 4.2  --  4.1  CL 95*  --  100*  CO2 24  --  27  GLUCOSE 123*  --  160*  BUN 10  --  12  CREATININE 0.71 0.87 0.81  CALCIUM 9.1  --  8.6*   GFR Estimated Creatinine Clearance: 107.9 mL/min (by C-G formula based on SCr of 0.81 mg/dL). Liver Function Tests: No results for input(s): AST, ALT, ALKPHOS, BILITOT, PROT, ALBUMIN in the last 168 hours. No results for input(s): LIPASE, AMYLASE in the last 168 hours. No results for input(s): AMMONIA in the last 168 hours. Coagulation profile  Recent Labs Lab 09/28/16 1050  INR 1.03    CBC:  Recent Labs Lab 09/28/16 1050 09/28/16 1401 09/29/16 0202  WBC 18.7* 14.6* 9.9  NEUTROABS 17.0*  --   --   HGB 12.4* 11.4* 11.1*  HCT 36.3* 33.5* 32.9*  MCV 94.5 94.9 95.6  PLT 407* 349 312   Cardiac Enzymes:  Recent Labs Lab  09/28/16 1050 09/28/16 1401 09/28/16 1944 09/29/16 0202  TROPONINI <0.03 <0.03 <0.03 <0.03   BNP (last 3 results) No results for input(s): PROBNP in the last 8760 hours. CBG: No results for input(s): GLUCAP in the last 168 hours. D-Dimer: No results for input(s): DDIMER in the last 72 hours. Hgb A1c: No results for input(s): HGBA1C in the last 72 hours. Lipid Profile: No results for input(s): CHOL, HDL, LDLCALC, TRIG, CHOLHDL, LDLDIRECT in the last 72 hours. Thyroid function studies: No results for input(s): TSH, T4TOTAL, T3FREE, THYROIDAB in the last 72 hours.  Invalid input(s): FREET3 Anemia work up: No results for input(s): VITAMINB12, FOLATE, FERRITIN, TIBC, IRON, RETICCTPCT in the last 72 hours. Sepsis Labs:  Recent Labs Lab 09/28/16 1050 09/28/16 1129 09/28/16 1401 09/29/16 0202  WBC 18.7*  --  14.6* 9.9  LATICACIDVEN  --  1.51  --   --    Microbiology Recent Results (from the past 240 hour(s))  Culture, blood (Routine x 2)     Status: None (Preliminary result)    Collection Time: 09/28/16 10:50 AM  Result Value Ref Range Status   Specimen Description BLOOD BLOOD RIGHT FOREARM  Final   Special Requests   Final    BOTTLES DRAWN AEROBIC AND ANAEROBIC Blood Culture adequate volume   Culture   Final    NO GROWTH < 24 HOURS Performed at [facility], [place] [address] [place], [place] [address]    Report Status PENDING  Incomplete  Culture, blood (Routine x 2)     Status: None (Preliminary result)   Collection Time: 09/28/16 11:00 AM  Result Value Ref Range Status   Specimen Description BLOOD LEFT HAND  Final   Special Requests IN PEDIATRIC BOTTLE Blood Culture adequate volume  Final   Culture   Final    NO GROWTH < 24 HOURS Performed at [facility], [place] [address], [place], [place] [address]    Report Status PENDING  Incomplete     Medications:   . colchicine  0.6 mg Oral BID  . ibuprofen  600 mg Oral Q8H  . pantoprazole  40 mg Oral Daily  . sodium chloride flush  3 mL Intravenous Q12H   Continuous Infusions: . sodium chloride      Time spent: 25 min   LOS: 1 day   Charlynne Cousins  [facility] Pager [phone]  *Please refer to [email or web address], password [email or web address] to get updated schedule on who will round on this patient, as hospitalists switch teams weekly. If 7PM-7AM, please contact night-coverage at [email or web address], password [email or web address] for any overnight needs.  09/30/2016, 7:49 AM

## 2016-09-30 NOTE — Progress Notes (Addendum)
Progress Note  Patient Name: Douglas Kelly Date of Encounter: 09/30/2016  Primary Cardiologist: Dr. Marlou Porch  Subjective   Still having pleuritic chest pain, improved with sitting up and walking around the room. Pain exacerbated by taking a deep breath.   Inpatient Medications    Scheduled Meds: . colchicine  0.6 mg Oral BID  . indomethacin  50 mg Oral TID WC  . pantoprazole  40 mg Oral Daily  . sodium chloride flush  3 mL Intravenous Q12H   Continuous Infusions: . sodium chloride     PRN Meds: sodium chloride, acetaminophen **OR** acetaminophen, albuterol, guaiFENesin-dextromethorphan, HYDROmorphone (DILAUDID) injection, iopamidol, ondansetron **OR** ondansetron (ZOFRAN) IV, oxyCODONE, sodium chloride flush   Vital Signs    Vitals:   09/29/16 1349 09/29/16 1740 09/29/16 2148 09/30/16 0435  BP: 112/77  103/64 98/62  Pulse: (!) 101  (!) 104 91  Resp: 18  18 18   Temp: 98.3 F (36.8 C)  98.5 F (36.9 C) 97.9 F (36.6 C)  TempSrc: Oral  Oral Oral  SpO2: 95% 98% 98% 98%  Weight:      Height:        Intake/Output Summary (Last 24 hours) at 09/30/16 0949 Last data filed at 09/30/16 0600  Gross per 24 hour  Intake              720 ml  Output                0 ml  Net              720 ml   Filed Weights   09/28/16 1500  Weight: 150 lb 12.7 oz (68.4 kg)    Telemetry    Not connected to telemetry.   ECG    No new tracings.   Physical Exam   General: Well developed, well nourished Caucasian male appearing in no acute distress. Head: Normocephalic, atraumatic.  Neck: Supple without bruits, JVD not elevated. Lungs:  Resp regular and unlabored. No moving air noted along upper aspect of left lung. Right lung is clear.  Heart: Regular rhythm, tachycardiac rate, S1, S2, no S3, S4, or murmur; no rub. Abdomen: Soft, non-tender, non-distended with normoactive bowel sounds. No hepatomegaly. No rebound/guarding. No obvious abdominal masses. Extremities: No clubbing,  cyanosis, or lower extremity edema. Distal pedal pulses are 2+ bilaterally. Neuro: Alert and oriented X 3. Moves all extremities spontaneously. Psych: Normal affect.  Labs    Chemistry Recent Labs Lab 09/28/16 1050 09/28/16 1401 09/29/16 0202  NA 131*  --  135  K 4.2  --  4.1  CL 95*  --  100*  CO2 24  --  27  GLUCOSE 123*  --  160*  BUN 10  --  12  CREATININE 0.71 0.87 0.81  CALCIUM 9.1  --  8.6*  GFRNONAA >60 >60 >60  GFRAA >60 >60 >60  ANIONGAP 12  --  8     Hematology Recent Labs Lab 09/28/16 1050 09/28/16 1401 09/29/16 0202  WBC 18.7* 14.6* 9.9  RBC 3.84* 3.53* 3.44*  HGB 12.4* 11.4* 11.1*  HCT 36.3* 33.5* 32.9*  MCV 94.5 94.9 95.6  MCH 32.3 32.3 32.3  MCHC 34.2 34.0 33.7  RDW 11.8 12.0 12.1  PLT 407* 349 312    Cardiac Enzymes Recent Labs Lab 09/28/16 1050 09/28/16 1401 09/28/16 1944 09/29/16 0202  TROPONINI <0.03 <0.03 <0.03 <0.03    Recent Labs Lab 09/28/16 1100  TROPIPOC 0.01     BNPNo results for  input(s): BNP, PROBNP in the last 168 hours.   DDimer No results for input(s): DDIMER in the last 168 hours.   Radiology    Ct Angio Chest Pe W Or Wo Contrast  Result Date: 09/29/2016 CLINICAL DATA:  Lung cancer diagnosed December 2017. Radiation therapy completed. Chemotherapy ongoing. Shortness of breath and cough. EXAM: CT ANGIOGRAPHY CHEST WITH CONTRAST TECHNIQUE: Multidetector CT imaging of the chest was performed using the standard protocol during bolus administration of intravenous contrast. Multiplanar CT image reconstructions and MIPs were obtained to evaluate the vascular anatomy. CONTRAST:  100 ml Omnipaque 300. COMPARISON:  Chest CT 07/23/2016 and 05/30/2016. FINDINGS: Cardiovascular: The pulmonary arteries are well opacified with contrast to the level of the subsegmental branches. There is no evidence of acute pulmonary embolism. There is attenuation of the left upper lobe pulmonary artery branches, similar to the previous study. No  systemic arterial abnormalities are seen. The heart size is normal. There is a new moderate-sized pericardial effusion. Mediastinum/Nodes: There are no enlarged mediastinal, hilar or axillary lymph nodes. The thyroid gland, trachea and esophagus demonstrate no significant findings. Lungs/Pleura: There are small bilateral pleural effusions. Degree of volume loss in the left hemithorax is similar to the previous study. There is persistent complete opacification of the left upper lobe. There is increased left lower lobe bronchiectasis and peribronchial opacity, likely related to radiation therapy. There is mildly increased atelectasis at the right lung base. These opacities partially obscure the previously demonstrated small pulmonary nodules. No enlarging nodules are seen. Upper abdomen: The visualized upper abdomen appears stable without suspicious findings. Musculoskeletal/Chest wall: There is no chest wall mass or suspicious osseous finding. Review of the MIP images confirms the above findings. IMPRESSION: 1. No evidence of acute pulmonary embolism. 2. New moderate-sized pericardial effusion and small bilateral pleural effusions. 3. Persistent complete left upper lobe collapse with increasing peribronchial opacities in the left lower lobe, probably related to radiation therapy. Electronically Signed   By: Richardean Sale M.D.   On: 09/29/2016 15:51   Dg Chest Port 1 View  Result Date: 09/28/2016 CLINICAL DATA:  Shortness of breath.  Chest pain. EXAM: PORTABLE CHEST 1 VIEW COMPARISON:  CT 07/23/2016.  Chest x-ray 06/16/2016. FINDINGS: Prominent left upper lobe atelectasis again noted in this patient with known bronchogenic carcinoma. Left-sided pleural effusion. Reference is made to recent CT report for discussion of a nodules present . Heart size cannot be evaluated due to left upper lobe atelectasis. No acute bony abnormality . IMPRESSION: Prominent left upper lobe atelectasis again noted in this patient with  known bronchogenic carcinoma. Small left pleural effusion . Electronically Signed   By: Marcello Moores  Register   On: 09/28/2016 11:47    Cardiac Studies   Echocardiogram: 09/29/2016 Study Conclusions  - Left ventricle: The cavity size was normal. Systolic function was   normal. The estimated ejection fraction was in the range of 60%   to 65%. Wall motion was normal; there were no regional wall   motion abnormalities. The study is not technically sufficient to   allow evaluation of LV diastolic function. - Aortic valve: Transvalvular velocity was within the normal range.   There was no stenosis. There was no regurgitation. - Mitral valve: Transvalvular velocity was within the normal range.   There was no evidence for stenosis. There was no regurgitation. - Left atrium: The atrium was moderately dilated. - Right ventricle: The cavity size was normal. Wall thickness was   normal. Systolic function was normal. - Right atrium: The  atrium was moderately dilated. - Tricuspid valve: There was no regurgitation. - Pericardium, extracardiac: A moderate, free-flowing pericardial   effusion was identified. Measures 1.63 cm. There was no chamber   collapse. There was evidence for increased RV-LV interaction   demonstrated by respirophasic changes in transmitral velocities.   Howver, given the normal IVC size and lack of RV diastolic   collapse, features were not consistent with tamponade physiology.  Patient Profile     46 y.o. male w/ PMH of bronchogenic lung cancer (Stage 3 NSCLC) and no prior cardiac history who presented to [facility] ED on 09/28/2016 for evaluation of pleuritic chest pain.   Assessment & Plan    1. Acute pericarditis - presented with pleuritic chest pain --> EKG, clinical scenario with fever, and positional chest dicomfort improved with upright position is thought to be most consistent with acute pericarditis.  - echocardiogram shows a preserved EF of 60-65% with a moderate,  free-flowing pericardial effusion with no evidence of tamponade physiology. CTA showing effusion is moderate in size as well.  - has been receiving IV Toradol. Ibuprofen switched to Indomethacin by admitting team as he reports continued chest discomfort. Continue Colchicine 0.6 mg BID for 3 months  2. Non-small cell lung cancer - followed by Dr. Julien Nordmann as an outpatient.   3. 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 Followed by Dr. Cyndia Bent in the past (contacted by Dr. Marlou Porch on 5/23).   Arna Medici , PA-C 9:49 AM 09/30/2016 Pager: [phone]  Personally seen and examined. Agree with above. Continued decreased left sided lung sounds CP continued but may be improved Agree with NSAID and colchicine Interesting, small bilateral pleural effusions on CT (not as large as expected to see) ? At this point any benefit from Pleurex or thoracentesis.  I suspect that no further tx is necessary from Dr. Cyndia Bent.  No new recs at this time.  Will sign off. Continue NSAID for 2 full weeks and colchicine for 3 months Will get a repeat limited ECHO in 4 weeks.   Candee Furbish, MD

## 2016-10-01 MED ORDER — INDOMETHACIN 50 MG PO CAPS: 50.0000 mg | ORAL_CAPSULE | Freq: Three times a day (TID) | ORAL | 3 refills | Status: DC

## 2016-10-01 MED ORDER — COLCHICINE 0.6 MG PO TABS: 0.6000 mg | ORAL_TABLET | Freq: Two times a day (BID) | ORAL | 0 refills | Status: AC

## 2016-10-01 MED FILL — INDOMETHACIN 50 MG CAPSULE: 50 | 30 days supply | Qty: 90 | Fill #0

## 2016-10-01 NOTE — Discharge Summary (Signed)
Physician Discharge Summary  Douglas Kelly [name] DOB: 1970/06/26 DOA: 09/28/2016  PCP: Patient, No Pcp Per  Admit date: 09/28/2016 Discharge date: 10/01/2016  Admitted From: home Disposition:  Home  Recommendations for Outpatient Follow-up:  1. Follow up with Cardiology in 4 weeks, Repeat echo at this time. 2.   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 2-D echo was done that showed a moderate pericardial effusion no time Cardiology discussed with CT surgery recommended no further intervention. CT scan of the chest showed no PE a chronic collapse along. Lower extremity Doppler was negative for DVT.  History of Collapse of the left lung  He had some hemoptysis a CT angios the chest was done that showed no new changes. His DVT prophylaxis was DC'd his hemoptysis resolved is probably due to squamous cell lung cancer. His collapsed lung since we chronic it was discussed with CT surgery and the remainder no further intervention. Next  Severe protein caloric malnutrition continue ensure 3 times a day.  Stage III squamous cell carcinoma of left lung [facility]) Discussed this case with the oncologist over the phone and he recommended no further interventions.  Minimall Hemoptysis: Get a CT angio of the chest rule out PE and reevaluate squamous cell malignancy of the lung. Resolved after stopping DVT  prophylaxis.  Discharge Instructions  Discharge Instructions    Diet - low sodium heart healthy    Complete by:  As directed    Increase activity slowly    Complete by:  As directed      Allergies as of 10/01/2016   No Known Allergies     Medication List    TAKE these medications   bisacodyl 5 MG EC tablet Commonly known as:  bisacodyl Take 1 tablet (5 mg total) by mouth daily as needed for moderate constipation.   colchicine 0.6 MG tablet Take 1 tablet (0.6 mg total) by mouth 2 (two) times daily.   indomethacin 50 MG capsule Commonly known as:  INDOCIN Take 1 capsule (50 mg total) by mouth 3 (three) times daily with meals.   multivitamin with minerals Tabs tablet Take 1 tablet by mouth daily.   nicotine 21 mg/24hr patch Commonly known as:  NICODERM CQ - dosed in mg/24 hours Place 1 patch (21 mg total) onto the skin daily.   prochlorperazine 10 MG tablet Commonly known as:  COMPAZINE Take 1 tablet (10 mg total) by mouth every 6 (six) hours as needed for nausea or vomiting.      Follow-up Information    Douglas Pain, MD Follow up on 10/28/2016.   Specialty:  Cardiology Why:  Appointment for a repeat echocardiogram (ultrasound to assess fluid around your heart) on 10/28/2016 at 1:00PM Contact information: [address] [place] [address] [phone]          No Known Allergies  Consultations:  Cardiology   Procedures/Studies: Ct Angio Chest Pe W Or Wo Contrast  Result Date: 09/29/2016 CLINICAL DATA:  Lung  cancer diagnosed December 2017. Radiation therapy completed. Chemotherapy ongoing. Shortness of breath and cough. EXAM: CT ANGIOGRAPHY CHEST WITH CONTRAST TECHNIQUE: Multidetector CT imaging of the chest was performed using the standard protocol during bolus administration of intravenous contrast. Multiplanar CT image reconstructions and MIPs were obtained to evaluate the vascular anatomy. CONTRAST:  100 ml Omnipaque 300.  COMPARISON:  Chest CT 07/23/2016 and 05/30/2016. FINDINGS: Cardiovascular: The pulmonary arteries are well opacified with contrast to the level of the subsegmental branches. There is no evidence of acute pulmonary embolism. There is attenuation of the left upper lobe pulmonary artery branches, similar to the previous study. No systemic arterial abnormalities are seen. The heart size is normal. There is a new moderate-sized pericardial effusion. Mediastinum/Nodes: There are no enlarged mediastinal, hilar or axillary lymph nodes. The thyroid gland, trachea and esophagus demonstrate no significant findings. Lungs/Pleura: There are small bilateral pleural effusions. Degree of volume loss in the left hemithorax is similar to the previous study. There is persistent complete opacification of the left upper lobe. There is increased left lower lobe bronchiectasis and peribronchial opacity, likely related to radiation therapy. There is mildly increased atelectasis at the right lung base. These opacities partially obscure the previously demonstrated small pulmonary nodules. No enlarging nodules are seen. Upper abdomen: The visualized upper abdomen appears stable without suspicious findings. Musculoskeletal/Chest wall: There is no chest wall mass or suspicious osseous finding. Review of the MIP images confirms the above findings. IMPRESSION: 1. No evidence of acute pulmonary embolism. 2. New moderate-sized pericardial effusion and small bilateral pleural effusions. 3. Persistent complete left upper lobe collapse with increasing peribronchial opacities in the left lower lobe, probably related to radiation therapy. Electronically Signed   By: Richardean Sale M.D.   On: 09/29/2016 15:51   Dg Chest Port 1 View  Result Date: 09/28/2016 CLINICAL DATA:  Shortness of breath.  Chest Kelly. EXAM: PORTABLE CHEST 1 VIEW COMPARISON:  CT 07/23/2016.  Chest x-ray 06/16/2016. FINDINGS: Prominent left upper lobe atelectasis again noted in  this patient with known bronchogenic carcinoma. Left-sided pleural effusion. Reference is made to recent CT report for discussion of a nodules present . Heart size cannot be evaluated due to left upper lobe atelectasis. No acute bony abnormality . IMPRESSION: Prominent left upper lobe atelectasis again noted in this patient with known bronchogenic carcinoma. Small left pleural effusion . Electronically Signed   By: Marcello Moores  Register   On: 09/28/2016 11:47    Subjective: Relates his Kelly is much improved.  Discharge Exam: Vitals:   09/30/16 2103 10/01/16 0445  BP: 102/67 103/64  Pulse: 89 91  Resp: 18 16  Temp: 98 F (36.7 C) 97.7 F (36.5 C)   Vitals:   09/29/16 2148 09/30/16 0435 09/30/16 2103 10/01/16 0445  BP: 103/64 98/62 102/67 103/64  Pulse: (!) 104 91 89 91  Resp: 18 18 18 16   Temp: 98.5 F (36.9 C) 97.9 F (36.6 C) 98 F (36.7 C) 97.7 F (36.5 C)  TempSrc: Oral Oral Oral Oral  SpO2: 98% 98% 99% 97%  Weight:      Height:        General:In no acute distress Cardiovascular: Irregular irregular rhythm with positive S1-S2 Respiratory: Good air movement clear to auscultation Abdominal: Positive bowel sounds soft nontender Extremities: no edema, no cyanosis    The results of significant diagnostics from this hospitalization (including imaging, microbiology, ancillary and laboratory) are listed below for reference.     Microbiology: Recent Results (from the past 240 hour(s))  Culture, blood (Routine x 2)     Status: None (Preliminary result)   Collection Time: 09/28/16 10:50 AM  Result Value Ref Range Status   Specimen Description BLOOD BLOOD RIGHT FOREARM  Final   Special Requests   Final    BOTTLES DRAWN AEROBIC AND ANAEROBIC Blood Culture adequate volume   Culture   Final    NO GROWTH 2 DAYS Performed at [facility], [address], [place], [place] [address]    Report Status PENDING  Incomplete  Culture, blood (Routine x 2)     Status: None  (Preliminary result)   Collection Time: 09/28/16 11:00 AM  Result Value Ref Range Status   Specimen Description BLOOD LEFT HAND  Final   Special Requests IN PEDIATRIC BOTTLE Blood Culture adequate volume  Final   Culture   Final    NO GROWTH 2 DAYS Performed at [facility], [place] [address], [place], [place] [address]    Report Status PENDING  Incomplete     Labs: BNP (last 3 results) No results for input(s): BNP in the last 8760 hours. Basic Metabolic Panel:  Recent Labs Lab 09/28/16 1050 09/28/16 1401 09/29/16 0202  NA 131*  --  135  K 4.2  --  4.1  CL 95*  --  100*  CO2 24  --  27  GLUCOSE 123*  --  160*  BUN 10  --  12  CREATININE 0.71 0.87 0.81  CALCIUM 9.1  --  8.6*   Liver Function Tests: No results for input(s): AST, ALT, ALKPHOS, BILITOT, PROT, ALBUMIN in the last 168 hours. No results for input(s): LIPASE, AMYLASE in the last 168 hours. No results for input(s): AMMONIA in the last 168 hours. CBC:  Recent Labs Lab 09/28/16 1050 09/28/16 1401 09/29/16 0202  WBC 18.7* 14.6* 9.9  NEUTROABS 17.0*  --   --   HGB 12.4* 11.4* 11.1*  HCT 36.3* 33.5* 32.9*  MCV 94.5 94.9 95.6  PLT 407* 349 312   Cardiac Enzymes:  Recent Labs Lab 09/28/16 1050 09/28/16 1401 09/28/16 1944 09/29/16 0202  TROPONINI <0.03 <0.03 <0.03 <0.03   BNP: Invalid input(s): POCBNP CBG: No results for input(s): GLUCAP in the last 168 hours. D-Dimer No results for input(s): DDIMER in the last 72 hours. Hgb A1c No results for input(s): HGBA1C in the last 72 hours. Lipid Profile No results for input(s): CHOL, HDL, LDLCALC, TRIG, CHOLHDL, LDLDIRECT in the last 72 hours. Thyroid function studies No results for input(s): TSH, T4TOTAL, T3FREE, THYROIDAB in the last 72 hours.  Invalid input(s): FREET3 Anemia work up No results for input(s): VITAMINB12, FOLATE, FERRITIN, TIBC, IRON, RETICCTPCT in the last 72 hours. Urinalysis    Component Value Date/Time   COLORURINE YELLOW  04/15/2016 1908   APPEARANCEUR CLEAR 04/15/2016 1908   LABSPEC >1.046 (H) 04/15/2016 1908   PHURINE 7.5 04/15/2016 1908   GLUCOSEU NEGATIVE 04/15/2016 1908   HGBUR NEGATIVE 04/15/2016 1908   BILIRUBINUR NEGATIVE 04/15/2016 1908   KETONESUR NEGATIVE 04/15/2016 1908   PROTEINUR NEGATIVE 04/15/2016 1908   NITRITE NEGATIVE 04/15/2016 1908   LEUKOCYTESUR NEGATIVE 04/15/2016 1908   Sepsis Labs Invalid input(s): PROCALCITONIN,  WBC,  LACTICIDVEN Microbiology Recent Results (from the past 240 hour(s))  Culture, blood (Routine x 2)     Status: None (Preliminary result)   Collection Time: 09/28/16 10:50 AM  Result Value Ref Range Status   Specimen Description BLOOD BLOOD RIGHT FOREARM  Final   Special Requests   Final  BOTTLES DRAWN AEROBIC AND ANAEROBIC Blood Culture adequate volume   Culture   Final    NO GROWTH 2 DAYS Performed at [facility], [place] [address], [place], [place] [address]    Report Status PENDING  Incomplete  Culture, blood (Routine x 2)     Status: None (Preliminary result)   Collection Time: 09/28/16 11:00 AM  Result Value Ref Range Status   Specimen Description BLOOD LEFT HAND  Final   Special Requests IN PEDIATRIC BOTTLE Blood Culture adequate volume  Final   Culture   Final    NO GROWTH 2 DAYS Performed at [facility], [place] [address] [place], [place] [address]    Report Status PENDING  Incomplete     Time coordinating discharge: Over 30 minutes  SIGNED:   Charlynne Cousins, MD  [facility] 10/01/2016, 7:26 AM Pager   If 7PM-7AM, please contact night-coverage [email or web address] Password [email or web address]

## 2016-10-01 NOTE — Progress Notes (Signed)
Patient discharged home with family. Discharge instructions/presription given and explained to patient and verbalized understanding. Patient denies any pain/distress. Accompanied home by family.

## 2016-10-03 LAB — CULTURE, BLOOD (ROUTINE X 2)
CULTURE: NO GROWTH
Culture: NO GROWTH
SPECIAL REQUESTS: ADEQUATE
Special Requests: ADEQUATE

## 2016-10-03 NOTE — Progress Notes (Signed)
NCM spoke to pt and he was able to get Colchicine for $3.00. Jonnie Finner RN CCM Case Mgmt phone [phone]

## 2016-10-13 ENCOUNTER — Ambulatory Visit (HOSPITAL_BASED_OUTPATIENT_CLINIC_OR_DEPARTMENT_OTHER): Payer: Medicaid Other

## 2016-10-13 ENCOUNTER — Other Ambulatory Visit (HOSPITAL_BASED_OUTPATIENT_CLINIC_OR_DEPARTMENT_OTHER): Payer: Medicaid Other

## 2016-10-13 ENCOUNTER — Telehealth: Payer: Self-pay | Admitting: Internal Medicine

## 2016-10-13 ENCOUNTER — Ambulatory Visit (HOSPITAL_BASED_OUTPATIENT_CLINIC_OR_DEPARTMENT_OTHER): Payer: Medicaid Other | Admitting: Internal Medicine

## 2016-10-13 ENCOUNTER — Encounter: Payer: Self-pay | Admitting: Internal Medicine

## 2016-10-13 VITALS — BP 111/59 | HR 84 | Temp 97.7°F | Resp 20 | Ht 68.0 in | Wt 154.2 lb

## 2016-10-13 DIAGNOSIS — C3492 Malignant neoplasm of unspecified part of left bronchus or lung: Secondary | ICD-10-CM

## 2016-10-13 DIAGNOSIS — R0789 Other chest pain: Secondary | ICD-10-CM | POA: Diagnosis not present

## 2016-10-13 DIAGNOSIS — Z5112 Encounter for antineoplastic immunotherapy: Secondary | ICD-10-CM

## 2016-10-13 DIAGNOSIS — Z79899 Other long term (current) drug therapy: Secondary | ICD-10-CM | POA: Diagnosis not present

## 2016-10-13 DIAGNOSIS — I319 Disease of pericardium, unspecified: Secondary | ICD-10-CM | POA: Diagnosis not present

## 2016-10-13 DIAGNOSIS — R5382 Chronic fatigue, unspecified: Secondary | ICD-10-CM

## 2016-10-13 LAB — CBC WITH DIFFERENTIAL/PLATELET
BASO%: 0.5 % (ref 0.0–2.0)
Basophils Absolute: 0.1 10*3/uL (ref 0.0–0.1)
EOS%: 1.7 % (ref 0.0–7.0)
Eosinophils Absolute: 0.2 10*3/uL (ref 0.0–0.5)
HCT: 33.1 % — ABNORMAL LOW (ref 38.4–49.9)
HGB: 10.9 g/dL — ABNORMAL LOW (ref 13.0–17.1)
LYMPH%: 8.4 % — AB (ref 14.0–49.0)
MCH: 31.4 pg (ref 27.2–33.4)
MCHC: 32.9 g/dL (ref 32.0–36.0)
MCV: 95.4 fL (ref 79.3–98.0)
MONO#: 0.4 10*3/uL (ref 0.1–0.9)
MONO%: 3.4 % (ref 0.0–14.0)
NEUT%: 86 % — ABNORMAL HIGH (ref 39.0–75.0)
NEUTROS ABS: 9.3 10*3/uL — AB (ref 1.5–6.5)
Platelets: 377 10*3/uL (ref 140–400)
RBC: 3.47 10*6/uL — AB (ref 4.20–5.82)
RDW: 12.1 % (ref 11.0–14.6)
WBC: 10.8 10*3/uL — AB (ref 4.0–10.3)
lymph#: 0.9 10*3/uL (ref 0.9–3.3)

## 2016-10-13 LAB — TSH: TSH: 1.142 m[IU]/L (ref 0.320–4.118)

## 2016-10-13 LAB — COMPREHENSIVE METABOLIC PANEL
ALT: 30 U/L (ref 0–55)
ANION GAP: 10 meq/L (ref 3–11)
AST: 17 U/L (ref 5–34)
Albumin: 2.8 g/dL — ABNORMAL LOW (ref 3.5–5.0)
Alkaline Phosphatase: 101 U/L (ref 40–150)
BILIRUBIN TOTAL: 0.33 mg/dL (ref 0.20–1.20)
BUN: 12 mg/dL (ref 7.0–26.0)
CO2: 24 meq/L (ref 22–29)
Calcium: 9.1 mg/dL (ref 8.4–10.4)
Chloride: 106 mEq/L (ref 98–109)
Creatinine: 0.9 mg/dL (ref 0.7–1.3)
Glucose: 163 mg/dl — ABNORMAL HIGH (ref 70–140)
Potassium: 4 mEq/L (ref 3.5–5.1)
Sodium: 140 mEq/L (ref 136–145)
TOTAL PROTEIN: 6.9 g/dL (ref 6.4–8.3)

## 2016-10-13 MED ORDER — OXYCODONE-ACETAMINOPHEN 5-325 MG PO TABS: 1.0000 | ORAL_TABLET | Freq: Four times a day (QID) | ORAL | 0 refills | Status: DC | PRN

## 2016-10-13 MED ORDER — SODIUM CHLORIDE 0.9 % IV SOLN
Freq: Once | INTRAVENOUS | Status: AC
  Administered 2016-10-13: 14:00:00 via INTRAVENOUS

## 2016-10-13 MED ORDER — DURVALUMAB 500 MG/10ML IV SOLN
620.0000 mg | Freq: Once | INTRAVENOUS | Status: AC
  Administered 2016-10-13: 620 mg via INTRAVENOUS
  Filled 2016-10-13: qty 2.4

## 2016-10-13 MED FILL — OXYCODONE-ACETAMINOPHEN 5-3: 5-325 | 10 days supply | Qty: 40 | Fill #0

## 2016-10-13 NOTE — Patient Instructions (Signed)
[facility] Discharge Instructions for Patients Receiving Chemotherapy  Today you received the following chemotherapy agents :  Durvalumab.  To help prevent nausea and vomiting after your treatment, we encourage you to take your nausea medication as prescribed.   If you develop nausea and vomiting that is not controlled by your nausea medication, call the clinic.   BELOW ARE SYMPTOMS THAT SHOULD BE REPORTED IMMEDIATELY:  *FEVER GREATER THAN 100.5 F  *CHILLS WITH OR WITHOUT FEVER  NAUSEA AND VOMITING THAT IS NOT CONTROLLED WITH YOUR NAUSEA MEDICATION  *UNUSUAL SHORTNESS OF BREATH  *UNUSUAL BRUISING OR BLEEDING  TENDERNESS IN MOUTH AND THROAT WITH OR WITHOUT PRESENCE OF ULCERS  *URINARY PROBLEMS  *BOWEL PROBLEMS  UNUSUAL RASH Items with * indicate a potential emergency and should be followed up as soon as possible.  Feel free to call the clinic you have any questions or concerns. The clinic phone number is [phone].  Please show the [facility] at check-in to the Emergency Department and triage nurse.

## 2016-10-13 NOTE — Progress Notes (Signed)
[place] [phone]   [phone]  OFFICE PROGRESS NOTE  Patient, No Pcp Per No address on file  DIAGNOSIS: Stage IIIA (T2a, N2, M0) non-small cell lung cancer, squamous cell carcinoma diagnosed in December 2017 with almost complete collapse of the left lung.  PRIOR THERAPY: Concurrent chemoradiation with weekly carboplatin for AUC of 2 and paclitaxel 45 MG/M2. The patient is status post 6 weeks of radiation but only 2 cycles of chemotherapy because he missed several appointments.  CURRENT THERAPY: Immunotherapy with Imfinzi (Durvalumab) 10 MG/KG every 2 weeks, first dose 08/04/2016. Status post 3 cycles.  INTERVAL HISTORY: Douglas Kelly 46 y.o. male returns to the clinic today for follow-up visit accompanied by his girlfriend. The patient to be better today but continues to have the left sided chest pain. He was recently admitted to [facility] with substernal chest pain and he was diagnosed with pericarditis. He is currently on treatment with colchicine and indomethacin. He denied having any weight loss or night sweats. He continues to have shortness of breath with exertion and cough but no hemoptysis. He has no fever or chills. He has been off treatment for the last 4 weeks. He is here today for evaluation before resuming his treatment with immunotherapy.   MEDICAL HISTORY: Past Medical History:  Diagnosis Date  . 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 Encounter for antineoplastic chemotherapy 05/14/2016  . Goals of care, counseling/discussion 07/27/2016  . Pneumonia     ALLERGIES:  has No Known Allergies.  MEDICATIONS:  Current Outpatient Prescriptions  Medication Sig Dispense Refill  . bisacodyl (BISACODYL) 5 MG EC tablet Take 1 tablet (5 mg total) by mouth daily as needed for moderate constipation. (Patient not taking: Reported on 07/27/2016) 30 tablet 0  . colchicine 0.6 MG tablet Take 1 tablet (0.6 mg total) by mouth 2 (two) times  daily. 30 tablet 0  . indomethacin (INDOCIN) 50 MG capsule Take 1 capsule (50 mg total) by mouth 3 (three) times daily with meals. 90 capsule 3  . Multiple Vitamin (MULTIVITAMIN WITH MINERALS) TABS tablet Take 1 tablet by mouth daily.    . nicotine (NICODERM CQ - DOSED IN MG/24 HOURS) 21 mg/24hr patch Place 1 patch (21 mg total) onto the skin daily. (Patient not taking: Reported on 08/18/2016) 28 patch 0  . prochlorperazine (COMPAZINE) 10 MG tablet Take 1 tablet (10 mg total) by mouth every 6 (six) hours as needed for nausea or vomiting. (Patient not taking: Reported on 09/15/2016) 30 tablet 0   No current facility-administered medications for this visit.     SURGICAL HISTORY:  Past Surgical History:  Procedure Laterality Date  . VIDEO BRONCHOSCOPY Bilateral 04/19/2016   Procedure: VIDEO BRONCHOSCOPY WITHOUT FLUORO;  Surgeon: Juanito Doom, MD;  Location: [facility];  Service: Cardiopulmonary;  Laterality: Bilateral;    REVIEW OF SYSTEMS:  Constitutional: positive for fatigue Eyes: negative Ears, nose, mouth, throat, and face: negative Respiratory: positive for cough, dyspnea on exertion and pleurisy/chest pain Cardiovascular: negative Gastrointestinal: negative Genitourinary:negative Integument/breast: negative Hematologic/lymphatic: negative Musculoskeletal:negative Neurological: negative Behavioral/Psych: negative Endocrine: negative Allergic/Immunologic: negative   PHYSICAL EXAMINATION: General appearance: alert, cooperative, fatigued and no distress Head: Normocephalic, without obvious abnormality, atraumatic Neck: no adenopathy, no JVD, supple, symmetrical, trachea midline and thyroid not enlarged, symmetric, no tenderness/mass/nodules Lymph nodes: Cervical, supraclavicular, and axillary nodes normal. Resp: diminished breath sounds LUL and dullness to percussion LUL Back: symmetric, no curvature. ROM normal. No CVA tenderness. Cardio: regular rate  and rhythm, S1, S2  normal, no murmur, click, rub or gallop GI: soft, non-tender; bowel sounds normal; no masses,  no organomegaly Extremities: extremities normal, atraumatic, no cyanosis or edema Neurologic: Alert and oriented X 3, normal strength and tone. Normal symmetric reflexes. Normal coordination and gait  ECOG PERFORMANCE STATUS: 1 - Symptomatic but completely ambulatory  Blood pressure (!) 111/59, pulse 84, temperature 97.7 F (36.5 C), temperature source Oral, resp. rate 20, height 5\' 8"  (1.727 m), weight 154 lb 3.2 oz (69.9 kg), SpO2 97 %.  LABORATORY DATA: Lab Results  Component Value Date   WBC 10.8 (H) 10/13/2016   HGB 10.9 (L) 10/13/2016   HCT 33.1 (L) 10/13/2016   MCV 95.4 10/13/2016   PLT 377 10/13/2016      Chemistry      Component Value Date/Time   NA 135 09/29/2016 0202   NA 141 09/15/2016 1035   K 4.1 09/29/2016 0202   K 3.9 09/15/2016 1035   CL 100 (L) 09/29/2016 0202   CO2 27 09/29/2016 0202   CO2 24 09/15/2016 1035   BUN 12 09/29/2016 0202   BUN 9.1 09/15/2016 1035   CREATININE 0.81 09/29/2016 0202   CREATININE 0.9 09/15/2016 1035      Component Value Date/Time   CALCIUM 8.6 (L) 09/29/2016 0202   CALCIUM 9.3 09/15/2016 1035   ALKPHOS 89 09/15/2016 1035   AST 16 09/15/2016 1035   ALT 33 09/15/2016 1035   BILITOT 0.23 09/15/2016 1035       RADIOGRAPHIC STUDIES: Ct Angio Chest Pe W Or Wo Contrast  Result Date: 09/29/2016 CLINICAL DATA:  Lung cancer diagnosed December 2017. Radiation therapy completed. Chemotherapy ongoing. Shortness of breath and cough. EXAM: CT ANGIOGRAPHY CHEST WITH CONTRAST TECHNIQUE: Multidetector CT imaging of the chest was performed using the standard protocol during bolus administration of intravenous contrast. Multiplanar CT image reconstructions and MIPs were obtained to evaluate the vascular anatomy. CONTRAST:  100 ml Omnipaque 300. COMPARISON:  Chest CT 07/23/2016 and 05/30/2016. FINDINGS: Cardiovascular: The pulmonary arteries are well  opacified with contrast to the level of the subsegmental branches. There is no evidence of acute pulmonary embolism. There is attenuation of the left upper lobe pulmonary artery branches, similar to the previous study. No systemic arterial abnormalities are seen. The heart size is normal. There is a new moderate-sized pericardial effusion. Mediastinum/Nodes: There are no enlarged mediastinal, hilar or axillary lymph nodes. The thyroid gland, trachea and esophagus demonstrate no significant findings. Lungs/Pleura: There are small bilateral pleural effusions. Degree of volume loss in the left hemithorax is similar to the previous study. There is persistent complete opacification of the left upper lobe. There is increased left lower lobe bronchiectasis and peribronchial opacity, likely related to radiation therapy. There is mildly increased atelectasis at the right lung base. These opacities partially obscure the previously demonstrated small pulmonary nodules. No enlarging nodules are seen. Upper abdomen: The visualized upper abdomen appears stable without suspicious findings. Musculoskeletal/Chest wall: There is no chest wall mass or suspicious osseous finding. Review of the MIP images confirms the above findings. IMPRESSION: 1. No evidence of acute pulmonary embolism. 2. New moderate-sized pericardial effusion and small bilateral pleural effusions. 3. Persistent complete left upper lobe collapse with increasing peribronchial opacities in the left lower lobe, probably related to radiation therapy. Electronically Signed   By: Richardean Sale M.D.   On: 09/29/2016 15:51   Dg Chest Port 1 View  Result Date: 09/28/2016 CLINICAL DATA:  Shortness of breath.  Chest pain.  EXAM: PORTABLE CHEST 1 VIEW COMPARISON:  CT 07/23/2016.  Chest x-ray 06/16/2016. FINDINGS: Prominent left upper lobe atelectasis again noted in this patient with known bronchogenic carcinoma. Left-sided pleural effusion. Reference is made to recent CT  report for discussion of a nodules present . Heart size cannot be evaluated due to left upper lobe atelectasis. No acute bony abnormality . IMPRESSION: Prominent left upper lobe atelectasis again noted in this patient with known bronchogenic carcinoma. Small left pleural effusion . Electronically Signed   By: Marcello Moores  Register   On: 09/28/2016 11:47    ASSESSMENT AND PLAN:  This is a very pleasant 46 years old white male with stage IIIa non-small cell lung cancer, squamous cell carcinoma with central obstructing left lung mass as well as mediastinal lymphadenopathy diagnosed in December 2017. The patient was treated with a course of concurrent chemoradiation with weekly carboplatin and paclitaxel and he is currently undergoing consolidation treatment with immunotherapy with Imfinzi (Durvalumab). He is status post 3 cycles. His treatment has been on hold secondary to recent pericarditis. The patient is feeling much better and would like to resume his treatment. I recommended for him to proceed with cycle #4 today. If he develops any more pericarditis, I would discontinue his treatment with Imfinzi (Durvalumab). For the left-sided chest pain, I gave the patient prescription for Percocet 5/325 mg by mouth every 6 hours as needed. For the pericarditis, he will continue his current treatment with colchicine and indomethacin. He was advised to call immediately if he has any concerning symptoms in the interval. The patient voices understanding of current disease status and treatment options and is in agreement with the current care plan. All questions were answered. The patient knows to call the clinic with any problems, questions or concerns. We can certainly see the patient much sooner if necessary.  Disclaimer: This note was dictated with voice recognition software. Similar sounding words can inadvertently be transcribed and may not be corrected upon review.

## 2016-10-13 NOTE — Telephone Encounter (Signed)
Gave patient AVS and calender per 6/6 los

## 2016-10-22 ENCOUNTER — Emergency Department (HOSPITAL_COMMUNITY): Payer: Medicaid Other

## 2016-10-22 ENCOUNTER — Inpatient Hospital Stay (HOSPITAL_COMMUNITY)
Admission: EM | Admit: 2016-10-22 | Discharge: 2016-10-26 | DRG: 205 | Disposition: A | Discharge status: Home / Self Care | Payer: Medicaid Other | Attending: Internal Medicine | Admitting: Internal Medicine

## 2016-10-22 ENCOUNTER — Other Ambulatory Visit: Payer: Self-pay

## 2016-10-22 ENCOUNTER — Encounter (HOSPITAL_COMMUNITY): Payer: Self-pay | Admitting: Emergency Medicine

## 2016-10-22 DIAGNOSIS — C3412 Malignant neoplasm of upper lobe, left bronchus or lung: Secondary | ICD-10-CM | POA: Diagnosis present

## 2016-10-22 DIAGNOSIS — Z923 Personal history of irradiation: Secondary | ICD-10-CM | POA: Diagnosis not present

## 2016-10-22 DIAGNOSIS — J189 Pneumonia, unspecified organism: Secondary | ICD-10-CM

## 2016-10-22 DIAGNOSIS — Z79899 Other long term (current) drug therapy: Secondary | ICD-10-CM

## 2016-10-22 DIAGNOSIS — A419 Sepsis, unspecified organism: Secondary | ICD-10-CM | POA: Diagnosis present

## 2016-10-22 DIAGNOSIS — R0602 Shortness of breath: Secondary | ICD-10-CM

## 2016-10-22 DIAGNOSIS — J9819 Other pulmonary collapse: Principal | ICD-10-CM | POA: Diagnosis present

## 2016-10-22 DIAGNOSIS — Z87891 Personal history of nicotine dependence: Secondary | ICD-10-CM

## 2016-10-22 DIAGNOSIS — J470 Bronchiectasis with acute lower respiratory infection: Secondary | ICD-10-CM | POA: Diagnosis present

## 2016-10-22 DIAGNOSIS — I319 Disease of pericardium, unspecified: Secondary | ICD-10-CM | POA: Diagnosis present

## 2016-10-22 DIAGNOSIS — Z9221 Personal history of antineoplastic chemotherapy: Secondary | ICD-10-CM

## 2016-10-22 DIAGNOSIS — R0902 Hypoxemia: Secondary | ICD-10-CM

## 2016-10-22 DIAGNOSIS — J9811 Atelectasis: Secondary | ICD-10-CM | POA: Diagnosis present

## 2016-10-22 DIAGNOSIS — C3492 Malignant neoplasm of unspecified part of left bronchus or lung: Secondary | ICD-10-CM | POA: Diagnosis present

## 2016-10-22 DIAGNOSIS — J90 Pleural effusion, not elsewhere classified: Secondary | ICD-10-CM

## 2016-10-22 DIAGNOSIS — R55 Syncope and collapse: Secondary | ICD-10-CM

## 2016-10-22 HISTORY — DX: Other pulmonary collapse: J98.19

## 2016-10-22 HISTORY — DX: Malignant (primary) neoplasm, unspecified: C80.1

## 2016-10-22 LAB — PROTIME-INR
INR: 1.54
Prothrombin Time: 18.7 seconds — ABNORMAL HIGH (ref 11.4–15.2)

## 2016-10-22 LAB — COMPREHENSIVE METABOLIC PANEL
ALBUMIN: 2.8 g/dL — AB (ref 3.5–5.0)
ALK PHOS: 96 U/L (ref 38–126)
ALT: 32 U/L (ref 17–63)
ANION GAP: 8 (ref 5–15)
AST: 16 U/L (ref 15–41)
BUN: 15 mg/dL (ref 6–20)
CALCIUM: 8.3 mg/dL — AB (ref 8.9–10.3)
CO2: 25 mmol/L (ref 22–32)
Chloride: 102 mmol/L (ref 101–111)
Creatinine, Ser: 0.96 mg/dL (ref 0.61–1.24)
GFR calc non Af Amer: >60 mL/min (ref >60)
GLUCOSE: 162 mg/dL — AB (ref 65–99)
POTASSIUM: 4.2 mmol/L (ref 3.5–5.1)
SODIUM: 135 mmol/L (ref 135–145)
TOTAL PROTEIN: 7 g/dL (ref 6.5–8.1)
Total Bilirubin: 0.5 mg/dL (ref 0.3–1.2)

## 2016-10-22 LAB — CBC
HEMATOCRIT: 27.1 % — AB (ref 39.0–52.0)
Hemoglobin: 8.6 g/dL — ABNORMAL LOW (ref 13.0–17.0)
MCH: 30 pg (ref 26.0–34.0)
MCHC: 31.7 g/dL (ref 30.0–36.0)
MCV: 94.4 fL (ref 78.0–100.0)
Platelets: 377 10*3/uL (ref 150–400)
RBC: 2.87 MIL/uL — ABNORMAL LOW (ref 4.22–5.81)
RDW: 12.8 % (ref 11.5–15.5)
WBC: 18.4 10*3/uL — ABNORMAL HIGH (ref 4.0–10.5)

## 2016-10-22 LAB — URINALYSIS, ROUTINE W REFLEX MICROSCOPIC
BACTERIA UA: NONE SEEN
Bilirubin Urine: NEGATIVE
Glucose, UA: NEGATIVE mg/dL
Hgb urine dipstick: NEGATIVE
KETONES UR: NEGATIVE mg/dL
Leukocytes, UA: NEGATIVE
Nitrite: NEGATIVE
PROTEIN: 30 mg/dL — AB
Specific Gravity, Urine: >1.046 — ABNORMAL HIGH (ref 1.005–1.030)
pH: 6 (ref 5.0–8.0)

## 2016-10-22 LAB — CBC WITH DIFFERENTIAL/PLATELET
BASOS ABS: 0 10*3/uL (ref 0.0–0.1)
BASOS PCT: 0 %
Eosinophils Absolute: 0 10*3/uL (ref 0.0–0.7)
Eosinophils Relative: 0 %
HEMATOCRIT: 33.3 % — AB (ref 39.0–52.0)
Hemoglobin: 10.9 g/dL — ABNORMAL LOW (ref 13.0–17.0)
LYMPHS PCT: 3 %
Lymphs Abs: 0.8 10*3/uL (ref 0.7–4.0)
MCH: 30.7 pg (ref 26.0–34.0)
MCHC: 32.7 g/dL (ref 30.0–36.0)
MCV: 93.8 fL (ref 78.0–100.0)
MONO ABS: 1.2 10*3/uL — AB (ref 0.1–1.0)
MONOS PCT: 5 %
NEUTROS ABS: 20.3 10*3/uL — AB (ref 1.7–7.7)
NEUTROS PCT: 92 %
Platelets: 437 10*3/uL — ABNORMAL HIGH (ref 150–400)
RBC: 3.55 MIL/uL — ABNORMAL LOW (ref 4.22–5.81)
RDW: 12.4 % (ref 11.5–15.5)
WBC: 22.4 10*3/uL — ABNORMAL HIGH (ref 4.0–10.5)

## 2016-10-22 LAB — MRSA PCR SCREENING: MRSA by PCR: NEGATIVE

## 2016-10-22 LAB — CREATININE, SERUM
Creatinine, Ser: 1.1 mg/dL (ref 0.61–1.24)
GFR calc non Af Amer: >60 mL/min (ref >60)

## 2016-10-22 LAB — APTT: aPTT: 51 seconds — ABNORMAL HIGH (ref 24–36)

## 2016-10-22 LAB — I-STAT TROPONIN, ED: TROPONIN I, POC: 0 ng/mL (ref 0.00–0.08)

## 2016-10-22 LAB — I-STAT CG4 LACTIC ACID, ED: LACTIC ACID, VENOUS: 1.57 mmol/L (ref 0.5–1.9)

## 2016-10-22 LAB — PROCALCITONIN: PROCALCITONIN: 2.19 ng/mL

## 2016-10-22 LAB — LACTIC ACID, PLASMA: LACTIC ACID, VENOUS: 2.4 mmol/L — AB (ref 0.5–1.9)

## 2016-10-22 MED ORDER — ONDANSETRON HCL 4 MG PO TABS: 4.0000 mg | ORAL_TABLET | Freq: Four times a day (QID) | ORAL | Status: DC | PRN

## 2016-10-22 MED ORDER — ACETAMINOPHEN 650 MG RE SUPP: 650.0000 mg | Freq: Four times a day (QID) | RECTAL | Status: DC | PRN

## 2016-10-22 MED ORDER — PIPERACILLIN-TAZOBACTAM 3.375 G IVPB
3.3750 g | Freq: Three times a day (TID) | INTRAVENOUS | Status: DC
  Administered 2016-10-22 – 2016-10-26 (×11): 3.375 g via INTRAVENOUS
  Filled 2016-10-22 (×15): qty 50

## 2016-10-22 MED ORDER — ACETAMINOPHEN 325 MG PO TABS
650.0000 mg | ORAL_TABLET | Freq: Four times a day (QID) | ORAL | Status: DC | PRN
  Administered 2016-10-22: 650 mg via ORAL
  Filled 2016-10-22: qty 2

## 2016-10-22 MED ORDER — ONDANSETRON HCL 4 MG/2ML IJ SOLN: 4.0000 mg | Freq: Four times a day (QID) | INTRAMUSCULAR | Status: DC | PRN

## 2016-10-22 MED ORDER — SODIUM CHLORIDE 0.9 % IV BOLUS (SEPSIS): 1000.0000 mL | Freq: Once | INTRAVENOUS | Status: DC

## 2016-10-22 MED ORDER — GUAIFENESIN ER 600 MG PO TB12
1200.0000 mg | ORAL_TABLET | Freq: Two times a day (BID) | ORAL | Status: DC
  Administered 2016-10-22 – 2016-10-26 (×8): 1200 mg via ORAL
  Filled 2016-10-22 (×8): qty 2

## 2016-10-22 MED ORDER — DEXTROSE 5 % IV SOLN
2.0000 g | Freq: Once | INTRAVENOUS | Status: AC
  Administered 2016-10-22: 2 g via INTRAVENOUS
  Filled 2016-10-22: qty 2

## 2016-10-22 MED ORDER — INDOMETHACIN 25 MG PO CAPS
50.0000 mg | ORAL_CAPSULE | Freq: Three times a day (TID) | ORAL | Status: DC
  Administered 2016-10-22 – 2016-10-25 (×9): 50 mg via ORAL
  Filled 2016-10-22 (×11): qty 2

## 2016-10-22 MED ORDER — SODIUM CHLORIDE 0.9 % IV BOLUS (SEPSIS)
1000.0000 mL | Freq: Once | INTRAVENOUS | Status: AC
  Administered 2016-10-22: 1000 mL via INTRAVENOUS

## 2016-10-22 MED ORDER — SODIUM CHLORIDE 3 % IN NEBU
4.0000 mL | INHALATION_SOLUTION | Freq: Every day | RESPIRATORY_TRACT | Status: AC
  Administered 2016-10-23 – 2016-10-24 (×2): 4 mL via RESPIRATORY_TRACT
  Filled 2016-10-22 (×3): qty 4

## 2016-10-22 MED ORDER — SODIUM CHLORIDE 0.9 % IV BOLUS (SEPSIS): 250.0000 mL | Freq: Once | INTRAVENOUS | Status: DC

## 2016-10-22 MED ORDER — KETOROLAC TROMETHAMINE 30 MG/ML IJ SOLN
30.0000 mg | Freq: Once | INTRAMUSCULAR | Status: AC
  Administered 2016-10-22: 30 mg via INTRAVENOUS
  Filled 2016-10-22: qty 1

## 2016-10-22 MED ORDER — VANCOMYCIN HCL IN DEXTROSE 1-5 GM/200ML-% IV SOLN
1000.0000 mg | Freq: Once | INTRAVENOUS | Status: AC
  Administered 2016-10-22: 1000 mg via INTRAVENOUS
  Filled 2016-10-22: qty 200

## 2016-10-22 MED ORDER — OXYCODONE-ACETAMINOPHEN 5-325 MG PO TABS
1.0000 | ORAL_TABLET | Freq: Four times a day (QID) | ORAL | Status: DC | PRN
  Administered 2016-10-23 – 2016-10-26 (×10): 1 via ORAL
  Filled 2016-10-22 (×10): qty 1

## 2016-10-22 MED ORDER — ALBUTEROL SULFATE (2.5 MG/3ML) 0.083% IN NEBU: 2.5000 mg | INHALATION_SOLUTION | RESPIRATORY_TRACT | Status: DC | PRN

## 2016-10-22 MED ORDER — IPRATROPIUM-ALBUTEROL 0.5-2.5 (3) MG/3ML IN SOLN
3.0000 mL | Freq: Four times a day (QID) | RESPIRATORY_TRACT | Status: DC
  Administered 2016-10-22 – 2016-10-23 (×5): 3 mL via RESPIRATORY_TRACT
  Filled 2016-10-22 (×5): qty 3

## 2016-10-22 MED ORDER — COLCHICINE 0.6 MG PO TABS
0.6000 mg | ORAL_TABLET | Freq: Two times a day (BID) | ORAL | Status: DC
  Administered 2016-10-22 – 2016-10-26 (×8): 0.6 mg via ORAL
  Filled 2016-10-22 (×8): qty 1

## 2016-10-22 MED ORDER — VANCOMYCIN HCL IN DEXTROSE 750-5 MG/150ML-% IV SOLN
750.0000 mg | Freq: Three times a day (TID) | INTRAVENOUS | Status: DC
  Administered 2016-10-22 – 2016-10-24 (×5): 750 mg via INTRAVENOUS
  Filled 2016-10-22 (×6): qty 150

## 2016-10-22 MED ORDER — VANCOMYCIN HCL IN DEXTROSE 1-5 GM/200ML-% IV SOLN
1000.0000 mg | Freq: Two times a day (BID) | INTRAVENOUS | Status: DC
  Administered 2016-10-22 – 2016-10-23 (×3): 1000 mg via INTRAVENOUS
  Filled 2016-10-22 (×4): qty 200

## 2016-10-22 MED ORDER — SODIUM CHLORIDE 0.9 % IV SOLN
INTRAVENOUS | Status: DC
  Administered 2016-10-22 – 2016-10-24 (×3): via INTRAVENOUS
  Administered 2016-10-24: 1000 mL via INTRAVENOUS
  Administered 2016-10-24 – 2016-10-25 (×3): via INTRAVENOUS

## 2016-10-22 MED ORDER — MORPHINE SULFATE (PF) 4 MG/ML IV SOLN
4.0000 mg | Freq: Once | INTRAVENOUS | Status: AC
  Administered 2016-10-22: 4 mg via INTRAVENOUS
  Filled 2016-10-22: qty 1

## 2016-10-22 MED ORDER — HEPARIN SODIUM (PORCINE) 5000 UNIT/ML IJ SOLN
5000.0000 [IU] | Freq: Three times a day (TID) | INTRAMUSCULAR | Status: DC
  Administered 2016-10-23 – 2016-10-26 (×9): 5000 [IU] via SUBCUTANEOUS
  Filled 2016-10-22 (×10): qty 1

## 2016-10-22 MED ORDER — IOPAMIDOL (ISOVUE-300) INJECTION 61%
75.0000 mL | Freq: Once | INTRAVENOUS | Status: AC | PRN
  Administered 2016-10-22: 75 mL via INTRAVENOUS

## 2016-10-22 NOTE — ED Notes (Signed)
Pt placed on 2L 02 [place] for 02 sats 93%.

## 2016-10-22 NOTE — H&P (Signed)
History and Physical    Douglas Kelly ZOX:[id number] DOB: [birth year] DOA: 10/22/2016  PCP: Patient, No Pcp Per  Primary oncologist: Dr. Julien Nordmann Patient coming from: Home  I have personally briefly reviewed patient's old medical records in [place]  Chief Complaint: Shortness of breath  HPI: Douglas Kelly is a 46 y.o. male with medical history significant of squamous cell lung cancer who was recently in the hospital at the end of May for pericarditis. He was discharged on colchicine and indomethacin and had been doing fairly well. He had followed up with his primary oncologist and restarted chemotherapy, his last treatment being on Wednesday. He reports that for the past 3 days he's been having intermittent fevers. Since this morning, he has developed worsening shortness of breath and cough productive of cream-colored sputum. He's not had any vomiting or diarrhea. He has chest pain across his chest. This is worse with cough.  ED Course: He was evaluated in the emergency room where he was noted to be tachycardic and mildly hypotensive. Lab work showed an elevated WBC count 22,000. Chest x-ray and CT scan of the chest repeated that showed worsening left lung collapse. On his last admission, he had a CT scan and this showed left upper lobe collapse. CT scan done today shows complete left lung collapse. There is concern for underlying mucous plugging. Case was discussed by ER physician with pulmonology at [facility] recommended transfer to [facility] for further evaluation.  Review of Systems: As per HPI otherwise 10 point review of systems negative.    Past Medical History:  Diagnosis Date  . Cancer ([place])    lung   . 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 Collapsed lung   . Encounter for antineoplastic chemotherapy 05/14/2016  . Goals of care, counseling/discussion 07/27/2016  . Pneumonia     Past Surgical History:  Procedure Laterality Date  . CHEST TUBE INSERTION    . FRACTURE  SURGERY    . HERNIA REPAIR    . TYMPANOSTOMY TUBE PLACEMENT    . VIDEO BRONCHOSCOPY Bilateral 04/19/2016   Procedure: VIDEO BRONCHOSCOPY WITHOUT FLUORO;  Surgeon: Juanito Doom, MD;  Location: [facility];  Service: Cardiopulmonary;  Laterality: Bilateral;     reports that he quit smoking about 6 months ago. His smoking use included Cigarettes. He smoked 1.00 pack per day. He has never used smokeless tobacco. He reports that he does not drink alcohol or use drugs.  No Known Allergies  Family history: No family history of lung cancer  Prior to Admission medications   Medication Sig Start Date End Date Taking? Authorizing Provider  acetaminophen (TYLENOL) 500 MG tablet Take 1,000 mg by mouth every 6 (six) hours as needed for fever.   Yes [provider]  colchicine 0.6 MG tablet Take 1 tablet (0.6 mg total) by mouth 2 (two) times daily. 10/01/16  Yes Charlynne Cousins, MD  Cyanocobalamin (VITAMIN B-12 PO) Take 1 tablet by mouth daily.   Yes [provider]  indomethacin (INDOCIN) 50 MG capsule Take 1 capsule (50 mg total) by mouth 3 (three) times daily with meals. 10/01/16  Yes Charlynne Cousins, MD  Multiple Vitamin (MULTIVITAMIN WITH MINERALS) TABS tablet Take 1 tablet by mouth daily.   Yes [provider]  oxyCODONE-acetaminophen (PERCOCET/ROXICET) 5-325 MG tablet Take 1 tablet by mouth every 6 (six) hours as needed for severe pain. 10/13/16  Yes Curt Bears, MD    Physical Exam: Vitals:   10/22/16 1133  10/22/16 1200 10/22/16 1230 10/22/16 1234  BP:  (!) 94/59 104/60 104/60  Pulse:  (!) 118 (!) 115   Resp:  (!) 29 17   Temp: 98.4 F (36.9 C)     TempSrc: Oral     SpO2:  94% 96%   Weight:      Height:        Constitutional: NAD, calm, comfortable Vitals:   10/22/16 1133 10/22/16 1200 10/22/16 1230 10/22/16 1234  BP:  (!) 94/59 104/60 104/60  Pulse:  (!) 118 (!) 115   Resp:  (!) 29 17   Temp: 98.4 F (36.9 C)     TempSrc: Oral       SpO2:  94% 96%   Weight:      Height:       Eyes: PERRL, lids and conjunctivae normal ENMT: Mucous membranes are moist. Posterior pharynx clear of any exudate or lesions.Normal dentition.  Neck: normal, supple, no masses, no thyromegaly Respiratory: Diminished breath sounds on left side Normal respiratory effort. No accessory muscle use.  Cardiovascular: S1, S2, tachycardic, no murmurs / rubs / gallops. No extremity edema. 2+ pedal pulses. No carotid bruits.  Abdomen: no tenderness, no masses palpated. No hepatosplenomegaly. Bowel sounds positive.  Musculoskeletal: no clubbing / cyanosis. No joint deformity upper and lower extremities. Good ROM, no contractures. Normal muscle tone.  Skin: no rashes, lesions, ulcers. No induration Neurologic: CN 2-12 grossly intact. Sensation intact, DTR normal. Strength 5/5 in all 4.  Psychiatric: Normal judgment and insight. Alert and oriented x 3. Normal mood.   Labs on Admission: I have personally reviewed following labs and imaging studies  CBC:  Recent Labs Lab 10/22/16 0835  WBC 22.4*  NEUTROABS 20.3*  HGB 10.9*  HCT 33.3*  MCV 93.8  PLT 413*   Basic Metabolic Panel:  Recent Labs Lab 10/22/16 0835  NA 135  K 4.2  CL 102  CO2 25  GLUCOSE 162*  BUN 15  CREATININE 0.96  CALCIUM 8.3*   GFR: Estimated Creatinine Clearance: 91 mL/min (by C-G formula based on SCr of 0.96 mg/dL). Liver Function Tests:  Recent Labs Lab 10/22/16 0835  AST 16  ALT 32  ALKPHOS 96  BILITOT 0.5  PROT 7.0  ALBUMIN 2.8*   No results for input(s): LIPASE, AMYLASE in the last 168 hours. No results for input(s): AMMONIA in the last 168 hours. Coagulation Profile: No results for input(s): INR, PROTIME in the last 168 hours. Cardiac Enzymes: No results for input(s): CKTOTAL, CKMB, CKMBINDEX, TROPONINI in the last 168 hours. BNP (last 3 results) No results for input(s): PROBNP in the last 8760 hours. HbA1C: No results for input(s): HGBA1C in the  last 72 hours. CBG: No results for input(s): GLUCAP in the last 168 hours. Lipid Profile: No results for input(s): CHOL, HDL, LDLCALC, TRIG, CHOLHDL, LDLDIRECT in the last 72 hours. Thyroid Function Tests: No results for input(s): TSH, T4TOTAL, FREET4, T3FREE, THYROIDAB in the last 72 hours. Anemia Panel: No results for input(s): VITAMINB12, FOLATE, FERRITIN, TIBC, IRON, RETICCTPCT in the last 72 hours. Urine analysis:    Component Value Date/Time   COLORURINE YELLOW 04/15/2016 1908   APPEARANCEUR CLEAR 04/15/2016 1908   LABSPEC >1.046 (H) 04/15/2016 1908   PHURINE 7.5 04/15/2016 1908   GLUCOSEU NEGATIVE 04/15/2016 1908   HGBUR NEGATIVE 04/15/2016 1908   BILIRUBINUR NEGATIVE 04/15/2016 1908   KETONESUR NEGATIVE 04/15/2016 1908   PROTEINUR NEGATIVE 04/15/2016 1908   NITRITE NEGATIVE 04/15/2016 1908   LEUKOCYTESUR NEGATIVE 04/15/2016 1908  Radiological Exams on Admission: Dg Chest 2 View  Result Date: 10/22/2016 CLINICAL DATA:  Shortness of breath for 2 days EXAM: CHEST  2 VIEW COMPARISON:  09/29/2016 FINDINGS: Complete opacification of left hemithorax is now seen. Given the recent CT findings this represents a combination of large left pleural effusion and underlying consolidation. Abrupt cut off of the left bronchus is noted. Hyperexpansion of the right lung is seen. No acute bony abnormality is noted. IMPRESSION: Total opacification of left hemithorax as described. Electronically Signed   By: Inez Catalina M.D.   On: 10/22/2016 09:05   Ct Chest W Contrast  Result Date: 10/22/2016 CLINICAL DATA:  Chest pain and shortness breath. Fever. History of lung carcinoma EXAM: CT CHEST WITH CONTRAST TECHNIQUE: Multidetector CT imaging of the chest was performed during intravenous contrast administration. CONTRAST:  69mL ISOVUE-300 IOPAMIDOL (ISOVUE-300) INJECTION 61% COMPARISON:  Chest CT Sep 29, 2016 and chest radiograph October 22, 2016 FINDINGS: Cardiovascular: There is no thoracic aortic  aneurysm or dissection. The visualized great vessels appear unremarkable. There are foci of coronary artery calcification. There is a pericardial effusion, smaller than on most recent CT examination. There is no major vessel pulmonary embolus demonstrated. Mediastinum/Nodes: Visualized thyroid appears normal. There are scattered subcentimeter mediastinal lymph nodes, unchanged from recent study. No adenopathy by size criteria evident. There is a small hiatal hernia. Lungs/Pleura: There is now complete collapse of the left lung with shift of heart and mediastinum toward the left. The previously noted opacification of a portion of the left lower lobe seen on recent CT is no longer evident. There is consolidation throughout the left lung with small pleural effusion on the left superimposed. On the right, the the lungs hyperexpanded. There is scarring with atelectasis in the medial aspect of the anterior segment of the left upper lobe, also present on recent prior study. Right lung otherwise clear. No appreciable pleural effusion on the right. There is abrupt termination of air in the left main bronchus without tapering. A well-defined mass is not seen by CT in this area. Upper Abdomen: Visualized upper abdominal structures appear unremarkable. Musculoskeletal: There are no blastic or lytic bone lesions. IMPRESSION: There is now complete collapse of the left lung. In there is evidence consolidation with fairly small pleural effusion on the left. There is abrupt termination of air in the left main bronchus without appreciable tapering. This finding may be due to progression of radiation therapy change. Given progression of collapse over a three-week time span, there may also be a degree of mucous plugging, although no mass is seen in the left main bronchus. This finding potentially may warrant bronchoscopy to further evaluate. Right lung hyperexpanded with focal scarring and atelectasis in medial aspect right upper lobe  anteriorly. Right lung otherwise clear. Multiple small lymph nodes present without frank adenopathy by size criteria. The current lymph nodes are stable compared to most recent study. There are foci of coronary artery calcification. There is no appreciable vascular calcification or narrowing in the aorta. There is a small hiatal hernia. There is a pericardial effusion, slightly smaller than on recent prior study. Electronically Signed   By: Lowella Grip III M.D.   On: 10/22/2016 12:07    EKG: Independently reviewed. 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 Left lung  collapse. Possibly related to mucus plugging versus progression of malignancy. Case was discussed by ER physician with Dr. Ashok Cordia on call for pulmonology at [facility] . it was felt the patient should initially be treated conservatively with pulmonary hygiene and chest PT . if patient does not improve, he may need bronchoscopy to be performed. For this reason, it was recommended the patient be transferred to [facility] were pulmonology concealing the consultation. This seems appropriate, since there is no pulmonology available at [facility] this weekend. We'll start the patient on bronchodilators, mucolytics, hypertonic saline nebulizer treatment 2. Postobstructive pneumonia. Consolidation noted on chest CT and patient has been having fevers. Started on vancomycin and Zosyn. 3. Sepsis. Patient noted to be hypotensive and tachycardic on arrival. Source of infection is pneumonia. Lactic acid is normal. Blood cultures have been drawn. Continue IV fluids and antibiotics. 4. Recent pericarditis. Patient has done well on colchicine and indomethacin. Renal function is normal. We'll continue current treatments. 5. Squamous cell carcinoma left lung,  stage III. Recently underwent chemotherapy on 6/13. Continue to follow with oncology after discharge.  DVT prophylaxis: heparin Code Status: full code Family Communication: no family present Disposition Plan: admit to [facility] for further management Consults called: pulmonology Ashok Cordia) Admission status: inpatient, stepdown   [facility] MD [facility] Pager [phone]  If 7PM-7AM, please contact night-coverage [email or web address] Password [email or web address]  10/22/2016, 1:44 PM

## 2016-10-22 NOTE — ED Provider Notes (Signed)
Emergency Department Provider Note   I have reviewed the triage vital signs and the nursing notes.   HISTORY  Chief Complaint Shortness of Breath   HPI Douglas Kelly is a 46 y.o. 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 Patient states he has a symptoms chronically because of his underlying medical condition but they have worsened significantly over the past 3 days. He endorses fever at home as high as 103F. He has been taking his home medications with little to no relief in pain symptoms or shortness of breath. He is currently undergoing chemotherapy with Dr. Julien Nordmann at [facility]. Last chemotherapy was Wednesday. Denies abdominal pain, vomiting, diarrhea. No radiation of symptoms.   Oncologist: Dr. Julien Nordmann  Current Therapy: Imfinzi (Durvalumab) every 2 weeks for 3 cycles.   Past Medical History:  Diagnosis Date  . Cancer ([place])    lung   . 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 Collapsed lung   . Encounter for antineoplastic chemotherapy 05/14/2016  . Goals of care, counseling/discussion 07/27/2016  . Pneumonia     Patient Active Problem List   Diagnosis Date Noted  . Pericarditis 09/28/2016  . Goals of care, counseling/discussion 07/27/2016  . Encounter for antineoplastic immunotherapy 07/27/2016  . 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 Hypersensitivity reaction 06/08/2016  . Pneumothorax 05/22/2016  . Stage III squamous cell carcinoma of left lung ([place]) 05/14/2016  . Encounter for antineoplastic chemotherapy 05/14/2016  . Protein-calorie malnutrition, severe 04/17/2016  . Collapse of left lung, hx of pneumonia 5 months ago 04/16/2016  . Pneumonia of left upper lobe due to infectious organism ([place])   . Postobstructive pneumonia 04/15/2016  .  Normocytic anemia 04/15/2016  . LFTs abnormal 04/15/2016  . Smoker 04/15/2016    Past Surgical History:  Procedure Laterality Date  . CHEST TUBE INSERTION    . FRACTURE SURGERY    . HERNIA REPAIR    . TYMPANOSTOMY TUBE PLACEMENT    . VIDEO BRONCHOSCOPY Bilateral 04/19/2016   Procedure: VIDEO BRONCHOSCOPY WITHOUT FLUORO;  Surgeon: Juanito Doom, MD;  Location: [facility];  Service: Cardiopulmonary;  Laterality: Bilateral;    Current Outpatient Rx  . Order #: [id number] Class: Historical Med  . Order #: [id number] Class: Print  . Order #: [id number] Class: Historical Med  . Order #: [id number] Class: Print  . Order #: [id number] Class: Historical Med  . Order #: [id number] Class: Print  . Order #: [id number] Class: Normal  . Order #: [id number] Class: Normal    Allergies Patient has no known allergies.  History reviewed. No pertinent family history.  Social History Social History  Substance Use Topics  . Smoking status: Former Smoker    Packs/day: 1.00    Types: Cigarettes    Quit date: 04/13/2016  . Smokeless tobacco: Never Used  . Alcohol use No    Review of Systems  Constitutional: Positive fever/chills Eyes: No visual changes. ENT: No sore throat. Cardiovascular: Positive chest pain. Respiratory: Positive shortness of breath. Gastrointestinal: No abdominal pain.  No nausea, no vomiting.  No diarrhea.  No constipation. Genitourinary: Negative for dysuria. Musculoskeletal: Negative for back pain. Skin: Negative for rash. Neurological: Negative for headaches, focal weakness or numbness.  10-point ROS otherwise negative.  ____________________________________________   PHYSICAL EXAM:  VITAL SIGNS: ED Triage Vitals  Enc Vitals Group  BP 10/22/16 0800 107/67     Pulse Rate 10/22/16 0800 (!) 107     Resp 10/22/16 0800 18     Temp 10/22/16 0800 98.1 F (36.7 C)     Temp Source 10/22/16 0800 Oral     SpO2 10/22/16 0800 98 %     Weight 10/22/16 0757 154 lb (69.9  kg)     Height 10/22/16 0757 5\' 8"  (1.727 m)   Constitutional: Alert and oriented. Well appearing and in no acute distress. Eyes: Conjunctivae are normal.  Head: Atraumatic. Nose: No congestion/rhinnorhea. Mouth/Throat: Mucous membranes are moist.  Oropharynx non-erythematous. Neck: No stridor.   Cardiovascular: Tachcyardia. Good peripheral circulation. Grossly normal heart sounds.   Respiratory: Slight increased respiratory effort.  No retractions. Lungs are diminished on the left.  Gastrointestinal: Soft and nontender. No distention.  Musculoskeletal: No lower extremity tenderness nor edema. No gross deformities of extremities. Neurologic:  Normal speech and language. No gross focal neurologic deficits are appreciated.  Skin:  Skin is warm, dry and intact. No rash noted.  ____________________________________________   LABS (all labs ordered are listed, but only abnormal results are displayed)  Labs Reviewed  COMPREHENSIVE METABOLIC PANEL - Abnormal; Notable for the following:       Result Value   Glucose, Bld 162 (*)    Calcium 8.3 (*)    Albumin 2.8 (*)    All other components within normal limits  CBC WITH DIFFERENTIAL/PLATELET - Abnormal; Notable for the following:    WBC 22.4 (*)    RBC 3.55 (*)    Hemoglobin 10.9 (*)    HCT 33.3 (*)    Platelets 437 (*)    Neutro Abs 20.3 (*)    Monocytes Absolute 1.2 (*)    All other components within normal limits  CULTURE, BLOOD (ROUTINE X 2)  CULTURE, BLOOD (ROUTINE X 2)  URINALYSIS, ROUTINE W REFLEX MICROSCOPIC  I-STAT CG4 LACTIC ACID, ED  I-STAT TROPOININ, ED   ____________________________________________  EKG   EKG Interpretation  Date/Time:  Friday October 22 2016 07:56:38 EDT Ventricular Rate:  106 PR Interval:    QRS Duration: 84 QT Interval:  340 QTC Calculation: 452 R Axis:   37 Text Interpretation:  Sinus tachycardia Borderline low voltage, extremity leads Anteroseptal infarct, old Nonspecific T abnormalities,  lateral leads Baseline wander in lead(s) V3 No STEMI.  Confirmed by Nanda Quinton [phone]) on 10/22/2016 8:10:05 AM       ____________________________________________  RADIOLOGY  Dg Chest 2 View  Result Date: 10/22/2016 CLINICAL DATA:  Shortness of breath for 2 days EXAM: CHEST  2 VIEW COMPARISON:  09/29/2016 FINDINGS: Complete opacification of left hemithorax is now seen. Given the recent CT findings this represents a combination of large left pleural effusion and underlying consolidation. Abrupt cut off of the left bronchus is noted. Hyperexpansion of the right lung is seen. No acute bony abnormality is noted. IMPRESSION: Total opacification of left hemithorax as described. Electronically Signed   By: Inez Catalina M.D.   On: 10/22/2016 09:05   Ct Chest W Contrast  Result Date: 10/22/2016 CLINICAL DATA:  Chest pain and shortness breath. Fever. History of lung carcinoma EXAM: CT CHEST WITH CONTRAST TECHNIQUE: Multidetector CT imaging of the chest was performed during intravenous contrast administration. CONTRAST:  84mL ISOVUE-300 IOPAMIDOL (ISOVUE-300) INJECTION 61% COMPARISON:  Chest CT Sep 29, 2016 and chest radiograph October 22, 2016 FINDINGS: Cardiovascular: There is no thoracic aortic aneurysm or dissection. The visualized great vessels appear unremarkable. There are foci of  coronary artery calcification. There is a pericardial effusion, smaller than on most recent CT examination. There is no major vessel pulmonary embolus demonstrated. Mediastinum/Nodes: Visualized thyroid appears normal. There are scattered subcentimeter mediastinal lymph nodes, unchanged from recent study. No adenopathy by size criteria evident. There is a small hiatal hernia. Lungs/Pleura: There is now complete collapse of the left lung with shift of heart and mediastinum toward the left. The previously noted opacification of a portion of the left lower lobe seen on recent CT is no longer evident. There is consolidation throughout  the left lung with small pleural effusion on the left superimposed. On the right, the the lungs hyperexpanded. There is scarring with atelectasis in the medial aspect of the anterior segment of the left upper lobe, also present on recent prior study. Right lung otherwise clear. No appreciable pleural effusion on the right. There is abrupt termination of air in the left main bronchus without tapering. A well-defined mass is not seen by CT in this area. Upper Abdomen: Visualized upper abdominal structures appear unremarkable. Musculoskeletal: There are no blastic or lytic bone lesions. IMPRESSION: There is now complete collapse of the left lung. In there is evidence consolidation with fairly small pleural effusion on the left. There is abrupt termination of air in the left main bronchus without appreciable tapering. This finding may be due to progression of radiation therapy change. Given progression of collapse over a three-week time span, there may also be a degree of mucous plugging, although no mass is seen in the left main bronchus. This finding potentially may warrant bronchoscopy to further evaluate. Right lung hyperexpanded with focal scarring and atelectasis in medial aspect right upper lobe anteriorly. Right lung otherwise clear. Multiple small lymph nodes present without frank adenopathy by size criteria. The current lymph nodes are stable compared to most recent study. There are foci of coronary artery calcification. There is no appreciable vascular calcification or narrowing in the aorta. There is a small hiatal hernia. There is a pericardial effusion, slightly smaller than on recent prior study. Electronically Signed   By: Lowella Grip III M.D.   On: 10/22/2016 12:07    ____________________________________________   PROCEDURES  Procedure(s) performed:   Procedures  CRITICAL CARE Performed by: Margette Fast Total critical care time: 30 minutes Critical care time was exclusive of  separately billable procedures and treating other patients. Critical care was necessary to treat or prevent imminent or life-threatening deterioration. Critical care was time spent personally by me on the following activities: development of treatment plan with patient and/or surrogate as well as nursing, discussions with consultants, evaluation of patient's response to treatment, examination of patient, obtaining history from patient or surrogate, ordering and performing treatments and interventions, ordering and review of laboratory studies, ordering and review of radiographic studies, pulse oximetry and re-evaluation of patient's condition.  Nanda Quinton, MD Emergency Medicine  ____________________________________________   INITIAL IMPRESSION / ASSESSMENT AND PLAN / ED COURSE  Pertinent labs & imaging results that were available during my care of the patient were reviewed by me and considered in my medical decision making (see chart for details).  Patient with past history of squamous cell carcinoma of the left lung with chronic collapsed lung and recent pericarditis presents with worsening chest pain and shortness of breath over the past 2-3 days with associated fever. Afebrile here but tachycardic. He has diminished sounds on the left with no hypoxemia. Plan for repeat chest x-ray, labs, blood cultures. In review of the patient's most  recent oncology note, the decision was made to restart his Durvalumab but would stop if pericarditis returned.   Patient remains tachycardia with no hypoxemia. Overall well appearing but occasionally will have a productive cough. CXR with now complete opacification of the left lung. Plan for CT. BP down-trending but no clinical change in patient status. Continue to follow closely with potential for rapid decompensation.   12:30 PM Spoke with Dr. Milinda Hirschfeld with Pulm/Crit Care regarding the patient's CT findings and potential for bronchoscopy. The suspicion is that  the patient likely has progression of his cancer versus mucus plugging. Would prefer conservative treatment with hypertonic and chest PT. Patient could be admitted to [facility] under the hospitalist service with them as consult. Paged and when the patient arrives to see the patient and placed orders. If conservative measures fail would consider bronchoscopy.   Discussed patient's case with Hospitalist, Dr. Roderic Palau. Patient and family (if present) updated with plan. Care transferred to Hospitalist service.  I reviewed all nursing notes, vitals, pertinent old records, EKGs, labs, imaging (as available).   ____________________________________________  FINAL CLINICAL IMPRESSION(S) / ED DIAGNOSES  Final diagnoses:  Shortness of breath  Malignant neoplasm of left lung, unspecified part of lung (HCC)  Pleural effusion     MEDICATIONS GIVEN DURING THIS VISIT:  Medications  vancomycin (VANCOCIN) IVPB 1000 mg/200 mL premix (not administered)  sodium chloride 0.9 % bolus 1,000 mL (0 mLs Intravenous Stopped 10/22/16 1133)  morphine 4 MG/ML injection 4 mg (4 mg Intravenous Given 10/22/16 0832)  ketorolac (TORADOL) 30 MG/ML injection 30 mg (30 mg Intravenous Given 10/22/16 1134)  iopamidol (ISOVUE-300) 61 % injection 75 mL (75 mLs Intravenous Contrast Given 10/22/16 1114)  ceFEPIme (MAXIPIME) 2 g in dextrose 5 % 50 mL IVPB (0 g Intravenous Stopped 10/22/16 1308)  vancomycin (VANCOCIN) IVPB 1000 mg/200 mL premix (1,000 mg Intravenous New Bag/Given 10/22/16 1229)  sodium chloride 0.9 % bolus 1,000 mL (1,000 mLs Intravenous New Bag/Given 10/22/16 1228)     NEW OUTPATIENT MEDICATIONS STARTED DURING THIS VISIT:  None   Note:  This document was prepared using Dragon voice recognition software and may include unintentional dictation errors.  Nanda Quinton, MD Emergency Medicine   Long, [facility], MD 10/22/16 [phone]

## 2016-10-22 NOTE — ED Notes (Signed)
Leaving with [facility] at this time, paperwork sent including EMTALA, facesheet, [facility] transfer, medical necessity form and e-signature.

## 2016-10-22 NOTE — ED Notes (Signed)
Patient transported to X-ray 

## 2016-10-22 NOTE — ED Notes (Signed)
Dr. Laverta Baltimore notified of hypotension and tachycardia.

## 2016-10-22 NOTE — ED Notes (Signed)
Pt was dx with lung cancer in December along with pneumonia, pt was chemotherapy and radiation started in January which caused left lung to collapse.  Pt had chest tube insertion in January .  Pt had partial collapse up until one month ago and then lung fully collapsed and they did not want to do any surgical interventions while on chemotherapy.

## 2016-10-22 NOTE — ED Notes (Signed)
Dr. Laverta Baltimore notified of pt having headache.

## 2016-10-22 NOTE — ED Triage Notes (Addendum)
Per EMS: Pt having sob x2 days and productive cough, brown sputum.  PT has lung cancer and reports a collapsed left lung and gets chemo txs every 2 weeks, last tx 10/13/16. Pt alert and oriented at this time, 02 sats 98% on RA. Pt received 500 bolus en route with ems.

## 2016-10-22 NOTE — ED Notes (Signed)
EKG completed at 7:56. Given to Dr. Laverta Baltimore.

## 2016-10-22 NOTE — Consult Note (Signed)
Name: Douglas Kelly MRN: [id number] DOB: 05-Oct-1970    ADMISSION DATE:  10/22/2016 CONSULTATION DATE:    Douglas Chance MD :  Douglas Kelly, [facility]  CHIEF COMPLAINT:  Shortness of breath and left lung atelectasis/collapse   BRIEF PATIENT DESCRIPTION:   46 year old male w/ h/o Squamous Cell Lung Cancer Stage IIIA diagnosed Dec 2017 w/ LUL collapse d/t centrally obstructing lung mass. He is followed by Douglas Kelly. He is s/p chemoradiation w/ weekly carboplatin and paclitaxel. Now he is s/p 3 cycles of immunotherapy w/ Durvalumab. This treatment had recently been on hold d/t a hospital admit for pericarditis. He received his 4th infusion on 6/6 and 5th cycle on 13th w/ the plan that should he develop pericarditis again in the future immunotherapy would need to be discontinued.   He presented to the ER at [facility] on 6/15 w/ cc: 3d h/o worsening shortness of breath w/ cough productive of cream colored sputum. In ED he was found to be hypotensive and was meeting SIRS/sepsis criteria. He had a CT chest done that showed  complete opacification of the left hemithorax (compared w/ prior films on record dating back to march 2018 where this was primarily LUL). He has been sent to [facility] for further evaluation and pulmonary consulted for recommendations re: airway clearance vs bronch.   SIGNIFICANT EVENTS    STUDIES:  CT chest 6/15: "There is now complete collapse of the left lung. In there is evidence consolidation with fairly small pleural effusion on the left. There is abrupt termination of air in the left main bronchus without appreciable tapering. This finding may be due to progression of radiation therapy change. Given progression of collapse over a three-week time span, there may also be a degree of mucous plugging, although no mass is seen in the left main bronchus" Douglas December MD)   PAST MEDICAL HISTORY :   has a past medical history of Cancer ([place]); Cancer associated pain (07/27/2016); Collapsed lung; Encounter for  antineoplastic chemotherapy (05/14/2016); Goals of care, counseling/discussion (07/27/2016); and Pneumonia.  has a past surgical history that includes Video bronchoscopy (Bilateral, 04/19/2016); Chest tube insertion; Hernia repair; Fracture surgery; and Tympanostomy tube placement. Prior to Admission medications   Medication Sig Start Date End Date Taking? Authorizing Provider  acetaminophen (TYLENOL) 500 MG tablet Take 1,000 mg by mouth every 6 (six) hours as needed for fever.   Yes [provider]  colchicine 0.6 MG tablet Take 1 tablet (0.6 mg total) by mouth 2 (two) times daily. 10/01/16  Yes Douglas Cousins, MD  Cyanocobalamin (VITAMIN B-12 PO) Take 1 tablet by mouth daily.   Yes [provider]  indomethacin (INDOCIN) 50 MG capsule Take 1 capsule (50 mg total) by mouth 3 (three) times daily with meals. 10/01/16  Yes Douglas Cousins, MD  Multiple Vitamin (MULTIVITAMIN WITH MINERALS) TABS tablet Take 1 tablet by mouth daily.   Yes [provider]  oxyCODONE-acetaminophen (PERCOCET/ROXICET) 5-325 MG tablet Take 1 tablet by mouth every 6 (six) hours as needed for severe pain. 10/13/16  Yes Douglas Bears, MD   No Known Allergies  FAMILY HISTORY:  family history is not on file. SOCIAL HISTORY:  reports that he quit smoking about 6 months ago. His smoking use included Cigarettes. He smoked 1.00 pack per day. He has never used smokeless tobacco. He reports that he does not drink alcohol or use drugs.  REVIEW OF SYSTEMS:  Positive for shortness of breath and blood-tinged sputum   Constitutional: Negative for fever,  chills, weight loss, malaise/fatigue and diaphoresis.  HENT: Negative for hearing loss, ear pain, nosebleeds, congestion, sore throat, neck pain, tinnitus and ear discharge.   Eyes: Negative for blurred vision, double vision, photophobia, pain, discharge and redness.  Respiratory: Negative for cough,  wheezing and stridor.   Cardiovascular: Negative  for chest pain, palpitations, orthopnea, claudication, leg swelling and PND.  Gastrointestinal: Negative for heartburn, nausea, vomiting, abdominal pain, diarrhea, constipation, blood in stool and melena.  Genitourinary: Negative for dysuria, urgency, frequency, hematuria and flank pain.  Musculoskeletal: Negative for myalgias, back pain, joint pain and falls.  Skin: Negative for itching and rash.  Neurological: Negative for dizziness, tingling, tremors, sensory change, speech change, focal weakness, seizures, loss of consciousness, weakness and headaches.  Endo/Heme/Allergies: Negative for environmental allergies and polydipsia. Does not bruise/bleed easily.  SUBJECTIVE:   VITAL SIGNS: Temp:  [98.1 F (36.7 C)-98.4 F (36.9 C)] 98.4 F (36.9 C) (06/15 1133) Pulse Rate:  [95-118] 115 (06/15 1230) Resp:  [17-30] 17 (06/15 1230) BP: (94-116)/(57-70) 104/60 (06/15 1234) SpO2:  [94 %-98 %] 96 % (06/15 1230) Weight:  [154 lb (69.9 kg)] 154 lb (69.9 kg) (06/15 0757)   No intake or output data in the 24 hours ending 10/22/16 1433  PHYSICAL EXAMINATION: Gen. Older than stated age, well-nourished, in no distress, anxious affect ENT - no lesions, no post nasal drip Neck: No JVD, no thyromegaly, no carotid bruits Lungs: no use of accessory muscles,  dullness to percussion on left, decreased breath sounds on left without rales or rhonchi  Cardiovascular: Rhythm regular, heart sounds  normal, no murmurs or gallops, no peripheral edema Abdomen: soft and non-tender, no hepatosplenomegaly, BS normal. Musculoskeletal: No deformities, no cyanosis or clubbing Neuro:  alert, non focal  CBC Recent Labs     10/22/16  0835  WBC  22.4*  HGB  10.9*  HCT  33.3*  PLT  437*    Coag's No results for input(s): APTT, INR in the last 72 hours.  BMET Recent Labs     10/22/16  0835  NA  135  K  4.2  CL  102  CO2  25  BUN  15  CREATININE  0.96  GLUCOSE  162*    Electrolytes Recent Labs      10/22/16  0835  CALCIUM  8.3*    Sepsis Markers No results for input(s): PROCALCITON, O2SATVEN in the last 72 hours.  Invalid input(s): LACTICACIDVEN  ABG No results for input(s): PHART, PCO2ART, PO2ART in the last 72 hours.  Liver Enzymes Recent Labs     10/22/16  0835  AST  16  ALT  32  ALKPHOS  96  BILITOT  0.5  ALBUMIN  2.8*    Cardiac Enzymes No results for input(s): TROPONINI, PROBNP in the last 72 hours.  Glucose No results for input(s): GLUCAP in the last 72 hours.  Imaging Dg Chest 2 View  Result Date: 10/22/2016 CLINICAL DATA:  Shortness of breath for 2 days EXAM: CHEST  2 VIEW COMPARISON:  09/29/2016 FINDINGS: Complete opacification of left hemithorax is now seen. Given the recent CT findings this represents a combination of large left pleural effusion and underlying consolidation. Abrupt cut off of the left bronchus is noted. Hyperexpansion of the right lung is seen. No acute bony abnormality is noted. IMPRESSION: Total opacification of left hemithorax as described. Electronically Signed   By: Inez Catalina M.D.   On: 10/22/2016 09:05   Ct Chest W Contrast  Result Date: 10/22/2016 CLINICAL  DATA:  Chest pain and shortness breath. Fever. History of lung carcinoma EXAM: CT CHEST WITH CONTRAST TECHNIQUE: Multidetector CT imaging of the chest was performed during intravenous contrast administration. CONTRAST:  49mL ISOVUE-300 IOPAMIDOL (ISOVUE-300) INJECTION 61% COMPARISON:  Chest CT Sep 29, 2016 and chest radiograph October 22, 2016 FINDINGS: Cardiovascular: There is no thoracic aortic aneurysm or dissection. The visualized great vessels appear unremarkable. There are foci of coronary artery calcification. There is a pericardial effusion, smaller than on most recent CT examination. There is no major vessel pulmonary embolus demonstrated. Mediastinum/Nodes: Visualized thyroid appears normal. There are scattered subcentimeter mediastinal lymph nodes, unchanged from recent  study. No adenopathy by size criteria evident. There is a small hiatal hernia. Lungs/Pleura: There is now complete collapse of the left lung with shift of heart and mediastinum toward the left. The previously noted opacification of a portion of the left lower lobe seen on recent CT is no longer evident. There is consolidation throughout the left lung with small pleural effusion on the left superimposed. On the right, the the lungs hyperexpanded. There is scarring with atelectasis in the medial aspect of the anterior segment of the left upper lobe, also present on recent prior study. Right lung otherwise clear. No appreciable pleural effusion on the right. There is abrupt termination of air in the left main bronchus without tapering. A well-defined mass is not seen by CT in this area. Upper Abdomen: Visualized upper abdominal structures appear unremarkable. Musculoskeletal: There are no blastic or lytic bone lesions. IMPRESSION: There is now complete collapse of the left lung. In there is evidence consolidation with fairly small pleural effusion on the left. There is abrupt termination of air in the left main bronchus without appreciable tapering. This finding may be due to progression of radiation therapy change. Given progression of collapse over a three-week time span, there may also be a degree of mucous plugging, although no mass is seen in the left main bronchus. This finding potentially may warrant bronchoscopy to further evaluate. Right lung hyperexpanded with focal scarring and atelectasis in medial aspect right upper lobe anteriorly. Right lung otherwise clear. Multiple small lymph nodes present without frank adenopathy by size criteria. The current lymph nodes are stable compared to most recent study. There are foci of coronary artery calcification. There is no appreciable vascular calcification or narrowing in the aorta. There is a small hiatal hernia. There is a pericardial effusion, slightly smaller  than on recent prior study. Electronically Signed   By: Lowella Grip III M.D.   On: 10/22/2016 12:07   ASSESSMENT / PLAN: Progressive dyspnea in setting of post-obstructive atelectasis +/- Pneumonia.  -unclear how much of this represents acute infection vs progression of his underlying malignancy. Has had sig LUL collapse dating back to March 2018.   Ct chest (personally reviewed) complete opacification of the left hemithorax w/ abrupt termination of the LMSB. No clear endobronchial debris  Plan Sputum culture  HCAP coverage (given frequent hospital exposure) Start w/ conservative pulmonary recruitment measure: hypertonic saline nebs, scheduled BDs, flutter, IS, mucolytics and chest PT.  Consider FOB only if doesn't recruit -of note in 04/2016 he was noted to have left main stem mass , I doubt that repeat bronchoscopy would be of much benefit    Stage IIIA squamous cell lung cancer - unfortunately his treatment course was completed by left pneumothorax and pericarditis Plan  He is currently on immunotherapy and on indomethacin and colchicine for pericarditis  PCCM will follow again on Monday  Kara Mead MD. Shade Flood. [facility] Pager [phone] If no response call [phone]   10/22/2016, 2:02 PM

## 2016-10-22 NOTE — Progress Notes (Signed)
Pharmacy Antibiotic Note  Douglas Kelly is a 46 y.o. male admitted on 10/22/2016 with pneumonia and sepsis.  Pharmacy has been consulted for Vancomycin and Zosyn dosing.  Pt received Cefepime 2g IV x 1 and Vancomycin 1000mg  IV x 1 at 1230.  He remains on Colchicine and Indomethacin for recent pericarditis.  Plan: Vancomycin 750mg  IV q8, start at 2100 Zosyn 3.375g IV q8, infusing over 4 hours Watch renal function, clinical status Vancomycin trough at steady state F/U culture data  Height: 5\' 8"  (172.7 cm) Weight: 160 lb 7.9 oz (72.8 kg) IBW/kg (Calculated) : 68.4  Temp (24hrs), Avg:98.2 F (36.8 C), Min:98.1 F (36.7 C), Max:98.4 F (36.9 C)   Recent Labs Lab 10/22/16 0835 10/22/16 0850  WBC 22.4*  --   CREATININE 0.96  --   LATICACIDVEN  --  1.57    Estimated Creatinine Clearance: 91 mL/min (by C-G formula based on SCr of 0.96 mg/dL).    No Known Allergies  Antimicrobials this admission: 6/15 Cefepime x 1 6/15 Vanc >> 6/15 Zosyn >>  Dose adjustments this admission: n/a  Microbiology results: 6/15 blood x 2  Thank you for allowing pharmacy to be a part of this patient's care.   Lewie Chamber., PharmD Clinical Pharmacist [facility]

## 2016-10-23 ENCOUNTER — Inpatient Hospital Stay (HOSPITAL_COMMUNITY): Payer: Medicaid Other

## 2016-10-23 DIAGNOSIS — I319 Disease of pericardium, unspecified: Secondary | ICD-10-CM

## 2016-10-23 LAB — COMPREHENSIVE METABOLIC PANEL
ALBUMIN: 2.1 g/dL — AB (ref 3.5–5.0)
ALT: 22 U/L (ref 17–63)
ANION GAP: 8 (ref 5–15)
AST: 12 U/L — ABNORMAL LOW (ref 15–41)
Alkaline Phosphatase: 87 U/L (ref 38–126)
BUN: 10 mg/dL (ref 6–20)
CHLORIDE: 104 mmol/L (ref 101–111)
CO2: 23 mmol/L (ref 22–32)
Calcium: 7.8 mg/dL — ABNORMAL LOW (ref 8.9–10.3)
Creatinine, Ser: 0.89 mg/dL (ref 0.61–1.24)
GFR calc non Af Amer: >60 mL/min (ref >60)
GLUCOSE: 197 mg/dL — AB (ref 65–99)
Potassium: 3.7 mmol/L (ref 3.5–5.1)
Sodium: 135 mmol/L (ref 135–145)
Total Bilirubin: 0.6 mg/dL (ref 0.3–1.2)
Total Protein: 5.8 g/dL — ABNORMAL LOW (ref 6.5–8.1)

## 2016-10-23 LAB — CBC
HEMATOCRIT: 28.9 % — AB (ref 39.0–52.0)
HEMOGLOBIN: 9.1 g/dL — AB (ref 13.0–17.0)
MCH: 29.6 pg (ref 26.0–34.0)
MCHC: 31.5 g/dL (ref 30.0–36.0)
MCV: 94.1 fL (ref 78.0–100.0)
Platelets: 367 10*3/uL (ref 150–400)
RBC: 3.07 MIL/uL — AB (ref 4.22–5.81)
RDW: 12.7 % (ref 11.5–15.5)
WBC: 15.6 10*3/uL — ABNORMAL HIGH (ref 4.0–10.5)

## 2016-10-23 MED ORDER — OXYCODONE HCL 5 MG PO TABS
10.0000 mg | ORAL_TABLET | Freq: Four times a day (QID) | ORAL | Status: DC | PRN
  Administered 2016-10-23 – 2016-10-24 (×5): 15 mg via ORAL
  Administered 2016-10-25: 10 mg via ORAL
  Administered 2016-10-25 – 2016-10-26 (×4): 15 mg via ORAL
  Filled 2016-10-23 (×10): qty 3

## 2016-10-23 MED ORDER — IPRATROPIUM-ALBUTEROL 0.5-2.5 (3) MG/3ML IN SOLN
3.0000 mL | Freq: Three times a day (TID) | RESPIRATORY_TRACT | Status: DC
  Administered 2016-10-24 – 2016-10-26 (×7): 3 mL via RESPIRATORY_TRACT
  Filled 2016-10-23 (×7): qty 3

## 2016-10-23 MED ORDER — HYDROCODONE-ACETAMINOPHEN 5-325 MG PO TABS
1.0000 | ORAL_TABLET | Freq: Four times a day (QID) | ORAL | Status: DC | PRN
  Administered 2016-10-23: 1 via ORAL
  Administered 2016-10-23: 2 via ORAL
  Filled 2016-10-23 (×2): qty 2
  Filled 2016-10-23: qty 1

## 2016-10-23 MED ORDER — ACETYLCYSTEINE 20 % IN SOLN
3.0000 mL | Freq: Three times a day (TID) | RESPIRATORY_TRACT | Status: DC
  Administered 2016-10-23 – 2016-10-25 (×7): 3 mL via RESPIRATORY_TRACT
  Administered 2016-10-25: 4 mL via RESPIRATORY_TRACT
  Administered 2016-10-26: 3 mL via RESPIRATORY_TRACT
  Filled 2016-10-23 (×12): qty 4

## 2016-10-23 NOTE — Progress Notes (Signed)
PROGRESS NOTE    Douglas Kelly  DTO:[id number] DOB: 1970/06/21 DOA: 10/22/2016 PCP: Patient, No Pcp Per  Brief Narrative: Douglas Kelly is a 46 y.o. male with medical history significant of squamous cell lung cancer who was recently in the hospital at the end of May for pericarditis. He was discharged on colchicine and indomethacin and had been doing fairly well. He had followed up with his primary oncologist and restarted chemotherapy, his last treatment being on 10/13/16. He reports that for the past 3 days he's been having intermittent fevers. Since this morning, he has developed worsening shortness of breath and cough productive of cream-colored sputum. CT chest notes Left Lung collapse   Assessment & Plan:   1. Left lung collapse.  - related to mucus plugging with HCAP versus progression of malignancy - continue vanc/Zosyn - continue Duonebs, add mucomist nebs, add chest PT -Pulm consult, may need Bronch -repeat CXR today  2.  Postobstructive pneumonia.  -Consolidation noted on chest CT with fever/productive cough -abx and rest as above -FU Blood CX  3. Recent pericarditis.  -Patient has done well on colchicine and indomethacin. Renal function is normal -continue same  4. Stage 3 nonsmall cell Lung CA -s/p Chemo/XRT, and recent Chemo 6/6 -followed by Dr.Mohamed  DVT prophylaxis: heparin Code Status: full code Family Communication: no family present Disposition Plan: admit to [facility] for further management  Antimicrobials:   vanc/Zosyn   Subjective: Breathing a little better  Objective: Vitals:   10/22/16 2339 10/23/16 0335 10/23/16 0904 10/23/16 1108  BP: (!) 99/59 118/69  110/69  Pulse: 87 (!) 104  85  Resp: 16 (!) 7  13  Temp: 97.5 F (36.4 C) 97.8 F (36.6 C)  98.1 F (36.7 C)  TempSrc: Oral Oral  Oral  SpO2: 95% 98% 100% 100%  Weight:      Height:        Intake/Output Summary (Last 24 hours) at 10/23/16 1111 Last data filed at 10/23/16 0600  Gross per 24  hour  Intake             1775 ml  Output                0 ml  Net             1775 ml   Filed Weights   10/22/16 0757 10/22/16 1613  Weight: 69.9 kg (154 lb) 72.8 kg (160 lb 7.9 oz)    Examination:  General exam: Appears calm and comfortable  Respiratory system: decreased breath sounds on left Cardiovascular system: S1 & S2 heard, RRR. No JVD, murmurs Gastrointestinal system: Abdomen is nondistended, soft and nontender. Normal bowel sounds heard. Central nervous system: Alert and oriented. No focal neurological deficits. Extremities: Symmetric 5 x 5 power. Skin: No rashes, lesions or ulcers Psychiatry: Judgement and insight appear normal. Mood & affect appropriate.     Data Reviewed:   CBC:  Recent Labs Lab 10/22/16 0835 10/22/16 1920 10/23/16 0411  WBC 22.4* 18.4* 15.6*  NEUTROABS 20.3*  --   --   HGB 10.9* 8.6* 9.1*  HCT 33.3* 27.1* 28.9*  MCV 93.8 94.4 94.1  PLT 437* 377 983   Basic Metabolic Panel:  Recent Labs Lab 10/22/16 0835 10/22/16 1920 10/23/16 0411  NA 135  --  135  K 4.2  --  3.7  CL 102  --  104  CO2 25  --  23  GLUCOSE 162*  --  197*  BUN 15  --  10  CREATININE 0.96 1.10 0.89  CALCIUM 8.3*  --  7.8*   GFR: Estimated Creatinine Clearance: 98.2 mL/min (by C-G formula based on SCr of 0.89 mg/dL). Liver Function Tests:  Recent Labs Lab 10/22/16 0835 10/23/16 0411  AST 16 12*  ALT 32 22  ALKPHOS 96 87  BILITOT 0.5 0.6  PROT 7.0 5.8*  ALBUMIN 2.8* 2.1*   No results for input(s): LIPASE, AMYLASE in the last 168 hours. No results for input(s): AMMONIA in the last 168 hours. Coagulation Profile:  Recent Labs Lab 10/22/16 1920  INR 1.54   Cardiac Enzymes: No results for input(s): CKTOTAL, CKMB, CKMBINDEX, TROPONINI in the last 168 hours. BNP (last 3 results) No results for input(s): PROBNP in the last 8760 hours. HbA1C: No results for input(s): HGBA1C in the last 72 hours. CBG: No results for input(s): GLUCAP in the last 168  hours. Lipid Profile: No results for input(s): CHOL, HDL, LDLCALC, TRIG, CHOLHDL, LDLDIRECT in the last 72 hours. Thyroid Function Tests: No results for input(s): TSH, T4TOTAL, FREET4, T3FREE, THYROIDAB in the last 72 hours. Anemia Panel: No results for input(s): VITAMINB12, FOLATE, FERRITIN, TIBC, IRON, RETICCTPCT in the last 72 hours. Urine analysis:    Component Value Date/Time   COLORURINE YELLOW 10/22/2016 1441   APPEARANCEUR CLEAR 10/22/2016 1441   LABSPEC >1.046 (H) 10/22/2016 1441   PHURINE 6.0 10/22/2016 1441   GLUCOSEU NEGATIVE 10/22/2016 1441   HGBUR NEGATIVE 10/22/2016 1441   BILIRUBINUR NEGATIVE 10/22/2016 1441   KETONESUR NEGATIVE 10/22/2016 1441   PROTEINUR 30 (A) 10/22/2016 1441   NITRITE NEGATIVE 10/22/2016 1441   LEUKOCYTESUR NEGATIVE 10/22/2016 1441   Sepsis Labs: @LABRCNTIP (procalcitonin:4,lacticidven:4)  ) Recent Results (from the past 240 hour(s))  Blood Culture (routine x 2)     Status: None (Preliminary result)   Collection Time: 10/22/16  8:35 AM  Result Value Ref Range Status   Specimen Description BLOOD RIGHT ARM  Final   Special Requests   Final    BOTTLES DRAWN AEROBIC AND ANAEROBIC Blood Culture adequate volume   Culture NO GROWTH < 24 HOURS  Final   Report Status PENDING  Incomplete  Blood Culture (routine x 2)     Status: None (Preliminary result)   Collection Time: 10/22/16  8:39 AM  Result Value Ref Range Status   Specimen Description BLOOD RIGHT ARM  Final   Special Requests   Final    BOTTLES DRAWN AEROBIC AND ANAEROBIC Blood Culture adequate volume   Culture NO GROWTH < 24 HOURS  Final   Report Status PENDING  Incomplete  MRSA PCR Screening     Status: None   Collection Time: 10/22/16  4:26 PM  Result Value Ref Range Status   MRSA by PCR NEGATIVE NEGATIVE Final    Comment:        The GeneXpert MRSA Assay (FDA approved for NASAL specimens only), is one component of a comprehensive MRSA colonization surveillance program. It is  not intended to diagnose MRSA infection nor to guide or monitor treatment for MRSA infections.          Radiology Studies: Dg Chest 2 View  Result Date: 10/23/2016 CLINICAL DATA:  Pt brought into the ED yesterday for sob x2 days and productive cough, brown sputum. PT has lung cancer and reports a collapsed left lung and gets chemo txs every 2 weeks, last tx 10/13/16. Hx of lung cancer stage 4, collapsed lung. EXAM: CHEST  2 VIEW COMPARISON:  10/22/2016 chest x-ray and chest CT FINDINGS:  There is volume loss and complete opacification of the left hemithorax, stable in appearance. The right lung is clear. No pulmonary edema. There is mild perihilar peribronchial thickening. Trace right pleural effusion. IMPRESSION: Stable appearance of the chest. Volume loss and complete opacification of the left hemithorax. Electronically Signed   By: Nolon Nations M.D.   On: 10/23/2016 10:38   Dg Chest 2 View  Result Date: 10/22/2016 CLINICAL DATA:  Shortness of breath for 2 days EXAM: CHEST  2 VIEW COMPARISON:  09/29/2016 FINDINGS: Complete opacification of left hemithorax is now seen. Given the recent CT findings this represents a combination of large left pleural effusion and underlying consolidation. Abrupt cut off of the left bronchus is noted. Hyperexpansion of the right lung is seen. No acute bony abnormality is noted. IMPRESSION: Total opacification of left hemithorax as described. Electronically Signed   By: Inez Catalina M.D.   On: 10/22/2016 09:05   Ct Chest W Contrast  Result Date: 10/22/2016 CLINICAL DATA:  Chest pain and shortness breath. Fever. History of lung carcinoma EXAM: CT CHEST WITH CONTRAST TECHNIQUE: Multidetector CT imaging of the chest was performed during intravenous contrast administration. CONTRAST:  14mL ISOVUE-300 IOPAMIDOL (ISOVUE-300) INJECTION 61% COMPARISON:  Chest CT Sep 29, 2016 and chest radiograph October 22, 2016 FINDINGS: Cardiovascular: There is no thoracic aortic  aneurysm or dissection. The visualized great vessels appear unremarkable. There are foci of coronary artery calcification. There is a pericardial effusion, smaller than on most recent CT examination. There is no major vessel pulmonary embolus demonstrated. Mediastinum/Nodes: Visualized thyroid appears normal. There are scattered subcentimeter mediastinal lymph nodes, unchanged from recent study. No adenopathy by size criteria evident. There is a small hiatal hernia. Lungs/Pleura: There is now complete collapse of the left lung with shift of heart and mediastinum toward the left. The previously noted opacification of a portion of the left lower lobe seen on recent CT is no longer evident. There is consolidation throughout the left lung with small pleural effusion on the left superimposed. On the right, the the lungs hyperexpanded. There is scarring with atelectasis in the medial aspect of the anterior segment of the left upper lobe, also present on recent prior study. Right lung otherwise clear. No appreciable pleural effusion on the right. There is abrupt termination of air in the left main bronchus without tapering. A well-defined mass is not seen by CT in this area. Upper Abdomen: Visualized upper abdominal structures appear unremarkable. Musculoskeletal: There are no blastic or lytic bone lesions. IMPRESSION: There is now complete collapse of the left lung. In there is evidence consolidation with fairly small pleural effusion on the left. There is abrupt termination of air in the left main bronchus without appreciable tapering. This finding may be due to progression of radiation therapy change. Given progression of collapse over a three-week time span, there may also be a degree of mucous plugging, although no mass is seen in the left main bronchus. This finding potentially may warrant bronchoscopy to further evaluate. Right lung hyperexpanded with focal scarring and atelectasis in medial aspect right upper lobe  anteriorly. Right lung otherwise clear. Multiple small lymph nodes present without frank adenopathy by size criteria. The current lymph nodes are stable compared to most recent study. There are foci of coronary artery calcification. There is no appreciable vascular calcification or narrowing in the aorta. There is a small hiatal hernia. There is a pericardial effusion, slightly smaller than on recent prior study. Electronically Signed   By: Lowella Grip  III M.D.   On: 10/22/2016 12:07        Scheduled Meds: . acetylcysteine  3 mL Nebulization TID  . colchicine  0.6 mg Oral BID  . guaiFENesin  1,200 mg Oral BID  . heparin  5,000 Units Subcutaneous Q8H  . indomethacin  50 mg Oral TID WC  . ipratropium-albuterol  3 mL Nebulization Q6H  . sodium chloride HYPERTONIC  4 mL Nebulization Daily   Continuous Infusions: . sodium chloride 75 mL/hr at 10/22/16 1900  . piperacillin-tazobactam (ZOSYN)  IV Stopped (10/23/16 0951)  . sodium chloride     And  . sodium chloride     And  . sodium chloride    . vancomycin 1,000 mg (10/23/16 0950)  . vancomycin Stopped (10/23/16 0545)     LOS: 1 day    Time spent: 23min    Domenic Polite, MD [facility] Pager [phone]  If 7PM-7AM, please contact night-coverage [email or web address] Password [email or web address] 10/23/2016, 11:11 AM

## 2016-10-24 LAB — BASIC METABOLIC PANEL
ANION GAP: 8 (ref 5–15)
BUN: 7 mg/dL (ref 6–20)
CHLORIDE: 106 mmol/L (ref 101–111)
CO2: 23 mmol/L (ref 22–32)
CREATININE: 1.15 mg/dL (ref 0.61–1.24)
Calcium: 8.4 mg/dL — ABNORMAL LOW (ref 8.9–10.3)
GFR calc non Af Amer: >60 mL/min (ref >60)
Glucose, Bld: 154 mg/dL — ABNORMAL HIGH (ref 65–99)
Potassium: 4.3 mmol/L (ref 3.5–5.1)
Sodium: 137 mmol/L (ref 135–145)

## 2016-10-24 LAB — VANCOMYCIN, RANDOM: Vancomycin Rm: 30

## 2016-10-24 NOTE — Progress Notes (Signed)
Patient trasfered from 4N to [phone] via wheelchair; alert and oriented x 4; no complaints of pain; IV saline locked in RAC and fluids running in RW @75cc /hr; Orient patient to room and unit; instructed how to use the call bell and  fall risk precautions. Will continue to monitor the patient.

## 2016-10-24 NOTE — Progress Notes (Signed)
Transferred to [place] room 29 by wheelchair, stable, report given to RN , belonging with pt.

## 2016-10-24 NOTE — Progress Notes (Signed)
PROGRESS NOTE    Douglas Kelly  EXB:[id number] DOB: 1971-04-19 DOA: 10/22/2016 PCP: Patient, No Pcp Per  Brief Narrative: Douglas Kelly is a 46 y.o. male with medical history significant of squamous cell lung cancer who was recently in the hospital at the end of May for pericarditis. He was discharged on colchicine and indomethacin and had been doing fairly well. He had followed up with his primary oncologist and restarted chemotherapy, his last treatment being on 10/13/16. He reports that for the past 3 days he's been having intermittent fevers. Since this morning, he has developed worsening shortness of breath and cough productive of cream-colored sputum. CT chest notes Left Lung collapse   Assessment & Plan:   1. Left lung collapse.  - related to mucus plugging with HCAP versus progression of malignancy - On Day 2 of vanc/Zosyn, Blood Cx negative, stop Vanc tomorrow  - continue Duonebs,mucomist nebs, chest PT -Pulm following -repeat CXR unchanged aeration of L Lung -symptomatically better  2.  Postobstructive pneumonia.  -Consolidation noted on chest CT with fever/productive cough -abx and rest as above -improving  3. Recent pericarditis.  -Patient has done well on colchicine and indomethacin. Renal function is normal -continue same  4. Stage 3 nonsmall cell Lung CA -s/p Chemo/XRT, and recent Chemo 6/6 -followed by Dr.Mohamed  DVT prophylaxis: heparin Code Status: full code Family Communication: no family present Disposition Plan: Tx to tele  Antimicrobials:   vanc/Zosyn   Subjective: Breathing improving, NO N/V, back pain better Objective: Vitals:   10/23/16 2305 10/24/16 0041 10/24/16 0324 10/24/16 0743  BP: 115/71 110/75 128/81 125/84  Pulse: 90 90 100 (!) 115  Resp: (!) 22 (!) 26 (!) 22 20  Temp: 97.6 F (36.4 C)  97.7 F (36.5 C) 98.2 F (36.8 C)  TempSrc: Oral  Oral Oral  SpO2: 100% 100% 94% 98%  Weight:      Height:        Intake/Output Summary (Last  24 hours) at 10/24/16 1053 Last data filed at 10/24/16 1000  Gross per 24 hour  Intake          4301.25 ml  Output             3125 ml  Net          1176.25 ml   Filed Weights   10/22/16 0757 10/22/16 1613  Weight: 69.9 kg (154 lb) 72.8 kg (160 lb 7.9 oz)    Examination: Gen: Awake, Alert, Oriented X 3,  HEENT: PERRLA, Neck supple, no JVD Lungs: poor air movement on the left CVS: RRR,No Gallops,Rubs or new Murmurs Abd: soft, Non tender, non distended, BS present Extremities: No Cyanosis, Clubbing or edema Skin: no new rashes    Data Reviewed:   CBC:  Recent Labs Lab 10/22/16 0835 10/22/16 1920 10/23/16 0411  WBC 22.4* 18.4* 15.6*  NEUTROABS 20.3*  --   --   HGB 10.9* 8.6* 9.1*  HCT 33.3* 27.1* 28.9*  MCV 93.8 94.4 94.1  PLT 437* 377 102   Basic Metabolic Panel:  Recent Labs Lab 10/22/16 0835 10/22/16 1920 10/23/16 0411  NA 135  --  135  K 4.2  --  3.7  CL 102  --  104  CO2 25  --  23  GLUCOSE 162*  --  197*  BUN 15  --  10  CREATININE 0.96 1.10 0.89  CALCIUM 8.3*  --  7.8*   GFR: Estimated Creatinine Clearance: 98.2 mL/min (by C-G formula based  on SCr of 0.89 mg/dL). Liver Function Tests:  Recent Labs Lab 10/22/16 0835 10/23/16 0411  AST 16 12*  ALT 32 22  ALKPHOS 96 87  BILITOT 0.5 0.6  PROT 7.0 5.8*  ALBUMIN 2.8* 2.1*   No results for input(s): LIPASE, AMYLASE in the last 168 hours. No results for input(s): AMMONIA in the last 168 hours. Coagulation Profile:  Recent Labs Lab 10/22/16 1920  INR 1.54   Cardiac Enzymes: No results for input(s): CKTOTAL, CKMB, CKMBINDEX, TROPONINI in the last 168 hours. BNP (last 3 results) No results for input(s): PROBNP in the last 8760 hours. HbA1C: No results for input(s): HGBA1C in the last 72 hours. CBG: No results for input(s): GLUCAP in the last 168 hours. Lipid Profile: No results for input(s): CHOL, HDL, LDLCALC, TRIG, CHOLHDL, LDLDIRECT in the last 72 hours. Thyroid Function Tests: No  results for input(s): TSH, T4TOTAL, FREET4, T3FREE, THYROIDAB in the last 72 hours. Anemia Panel: No results for input(s): VITAMINB12, FOLATE, FERRITIN, TIBC, IRON, RETICCTPCT in the last 72 hours. Urine analysis:    Component Value Date/Time   COLORURINE YELLOW 10/22/2016 1441   APPEARANCEUR CLEAR 10/22/2016 1441   LABSPEC >1.046 (H) 10/22/2016 1441   PHURINE 6.0 10/22/2016 1441   GLUCOSEU NEGATIVE 10/22/2016 1441   HGBUR NEGATIVE 10/22/2016 1441   BILIRUBINUR NEGATIVE 10/22/2016 1441   KETONESUR NEGATIVE 10/22/2016 1441   PROTEINUR 30 (A) 10/22/2016 1441   NITRITE NEGATIVE 10/22/2016 1441   LEUKOCYTESUR NEGATIVE 10/22/2016 1441   Sepsis Labs: @LABRCNTIP (procalcitonin:4,lacticidven:4)  ) Recent Results (from the past 240 hour(s))  Blood Culture (routine x 2)     Status: None (Preliminary result)   Collection Time: 10/22/16  8:35 AM  Result Value Ref Range Status   Specimen Description BLOOD RIGHT ARM  Final   Special Requests   Final    BOTTLES DRAWN AEROBIC AND ANAEROBIC Blood Culture adequate volume   Culture NO GROWTH 2 DAYS  Final   Report Status PENDING  Incomplete  Blood Culture (routine x 2)     Status: None (Preliminary result)   Collection Time: 10/22/16  8:39 AM  Result Value Ref Range Status   Specimen Description BLOOD RIGHT ARM  Final   Special Requests   Final    BOTTLES DRAWN AEROBIC AND ANAEROBIC Blood Culture adequate volume   Culture NO GROWTH 2 DAYS  Final   Report Status PENDING  Incomplete  MRSA PCR Screening     Status: None   Collection Time: 10/22/16  4:26 PM  Result Value Ref Range Status   MRSA by PCR NEGATIVE NEGATIVE Final    Comment:        The GeneXpert MRSA Assay (FDA approved for NASAL specimens only), is one component of a comprehensive MRSA colonization surveillance program. It is not intended to diagnose MRSA infection nor to guide or monitor treatment for MRSA infections.          Radiology Studies: Dg Chest 2  View  Result Date: 10/23/2016 CLINICAL DATA:  Pt brought into the ED yesterday for sob x2 days and productive cough, brown sputum. PT has lung cancer and reports a collapsed left lung and gets chemo txs every 2 weeks, last tx 10/13/16. Hx of lung cancer stage 4, collapsed lung. EXAM: CHEST  2 VIEW COMPARISON:  10/22/2016 chest x-ray and chest CT FINDINGS: There is volume loss and complete opacification of the left hemithorax, stable in appearance. The right lung is clear. No pulmonary edema. There is mild perihilar peribronchial  thickening. Trace right pleural effusion. IMPRESSION: Stable appearance of the chest. Volume loss and complete opacification of the left hemithorax. Electronically Signed   By: Nolon Nations M.D.   On: 10/23/2016 10:38   Ct Chest W Contrast  Result Date: 10/22/2016 CLINICAL DATA:  Chest pain and shortness breath. Fever. History of lung carcinoma EXAM: CT CHEST WITH CONTRAST TECHNIQUE: Multidetector CT imaging of the chest was performed during intravenous contrast administration. CONTRAST:  46mL ISOVUE-300 IOPAMIDOL (ISOVUE-300) INJECTION 61% COMPARISON:  Chest CT Sep 29, 2016 and chest radiograph October 22, 2016 FINDINGS: Cardiovascular: There is no thoracic aortic aneurysm or dissection. The visualized great vessels appear unremarkable. There are foci of coronary artery calcification. There is a pericardial effusion, smaller than on most recent CT examination. There is no major vessel pulmonary embolus demonstrated. Mediastinum/Nodes: Visualized thyroid appears normal. There are scattered subcentimeter mediastinal lymph nodes, unchanged from recent study. No adenopathy by size criteria evident. There is a small hiatal hernia. Lungs/Pleura: There is now complete collapse of the left lung with shift of heart and mediastinum toward the left. The previously noted opacification of a portion of the left lower lobe seen on recent CT is no longer evident. There is consolidation throughout the  left lung with small pleural effusion on the left superimposed. On the right, the the lungs hyperexpanded. There is scarring with atelectasis in the medial aspect of the anterior segment of the left upper lobe, also present on recent prior study. Right lung otherwise clear. No appreciable pleural effusion on the right. There is abrupt termination of air in the left main bronchus without tapering. A well-defined mass is not seen by CT in this area. Upper Abdomen: Visualized upper abdominal structures appear unremarkable. Musculoskeletal: There are no blastic or lytic bone lesions. IMPRESSION: There is now complete collapse of the left lung. In there is evidence consolidation with fairly small pleural effusion on the left. There is abrupt termination of air in the left main bronchus without appreciable tapering. This finding may be due to progression of radiation therapy change. Given progression of collapse over a three-week time span, there may also be a degree of mucous plugging, although no mass is seen in the left main bronchus. This finding potentially may warrant bronchoscopy to further evaluate. Right lung hyperexpanded with focal scarring and atelectasis in medial aspect right upper lobe anteriorly. Right lung otherwise clear. Multiple small lymph nodes present without frank adenopathy by size criteria. The current lymph nodes are stable compared to most recent study. There are foci of coronary artery calcification. There is no appreciable vascular calcification or narrowing in the aorta. There is a small hiatal hernia. There is a pericardial effusion, slightly smaller than on recent prior study. Electronically Signed   By: Lowella Grip III M.D.   On: 10/22/2016 12:07        Scheduled Meds: . acetylcysteine  3 mL Nebulization TID  . colchicine  0.6 mg Oral BID  . guaiFENesin  1,200 mg Oral BID  . heparin  5,000 Units Subcutaneous Q8H  . indomethacin  50 mg Oral TID WC  .  ipratropium-albuterol  3 mL Nebulization TID  . sodium chloride HYPERTONIC  4 mL Nebulization Daily   Continuous Infusions: . sodium chloride 75 mL/hr at 10/24/16 0545  . piperacillin-tazobactam (ZOSYN)  IV Stopped (10/24/16 0943)  . sodium chloride     And  . sodium chloride     And  . sodium chloride    . vancomycin Stopped (10/23/16  2208)  . vancomycin Stopped (10/24/16 4451)     LOS: 2 days    Time spent: 51min    Domenic Polite, MD [facility] Pager [phone]  If 7PM-7AM, please contact night-coverage [email or web address] Password [facility] 10/24/2016, 10:53 AM

## 2016-10-24 NOTE — Progress Notes (Addendum)
Pharmacy Antibiotic Note  Douglas Kelly is a 46 y.o. male admitted on 10/22/2016 with pneumonia and sepsis.  Pharmacy has been consulted for vancomycin and Zosy dosing.  Patient was started on vancomycin 1g IV 12h at [facility], and then started on vancomycin 750mg  IV q8h when transferred to [facility] without order from [facility] being discontinued. Patient has been receiving both dosing regimens since 6/15 ~1200- totaling 7.75g in 48h. Safety Zone Portal was completed by this Probation officer.  A random vancomycin level was obtained today and was elevated at 33mcg/mL (though lower than anticipated). SCr increased from 0.89>1.15.  Plan: -Zosyn 3.375g IV q8h -HOLD VANCOMYCIN. Noted Dr. Delene Loll plan to likely discontinue vancomycin tomorrow. Will not reorder a level at this time in anticipation of drug being permanently discontinued on 6/18 -Daily BMET x3 days to trend SCr  Height: 5\' 8"  (172.7 cm) Weight: 160 lb 7.9 oz (72.8 kg) IBW/kg (Calculated) : 68.4  Temp (24hrs), Avg:98 F (36.7 C), Min:97.6 F (36.4 C), Max:98.3 F (36.8 C)   Recent Labs Lab 10/22/16 0835 10/22/16 0850 10/22/16 1920 10/23/16 0411 10/24/16 1126 10/24/16 1129  WBC 22.4*  --  18.4* 15.6*  --   --   CREATININE 0.96  --  1.10 0.89  --  1.15  LATICACIDVEN  --  1.57 2.4*  --   --   --   VANCORANDOM  --   --   --   --  30  --     Estimated Creatinine Clearance: 76 mL/min (by C-G formula based on SCr of 1.15 mg/dL).    No Known Allergies  6/15 BCx: ngtd 6/15 MRSA PCR: neg  6/15 Cefepime x 1 6/15 Zosyn >> 6/15 Vanc >> 6/17   Thank you for allowing pharmacy to be a part of this patient's care.  Magdaline Zollars D. Joanette Silveria, PharmD, BCPS Clinical Pharmacist Pager: [phone] Clinical Phone for 10/24/2016 until 3:30pm: [phone] If after 3:30pm, please call main pharmacy at [phone] 10/24/2016 12:57 PM

## 2016-10-24 NOTE — Progress Notes (Signed)
Called lab and notified for vanco trough to be drawn, to send someone .

## 2016-10-25 ENCOUNTER — Inpatient Hospital Stay (HOSPITAL_COMMUNITY): Payer: Medicaid Other

## 2016-10-25 LAB — BASIC METABOLIC PANEL
ANION GAP: 10 (ref 5–15)
BUN: 7 mg/dL (ref 6–20)
CHLORIDE: 105 mmol/L (ref 101–111)
CO2: 23 mmol/L (ref 22–32)
Calcium: 8.7 mg/dL — ABNORMAL LOW (ref 8.9–10.3)
Creatinine, Ser: 1.12 mg/dL (ref 0.61–1.24)
GFR calc Af Amer: >60 mL/min (ref >60)
GLUCOSE: 118 mg/dL — AB (ref 65–99)
POTASSIUM: 4.2 mmol/L (ref 3.5–5.1)
Sodium: 138 mmol/L (ref 135–145)

## 2016-10-25 LAB — CBC
HEMATOCRIT: 31.3 % — AB (ref 39.0–52.0)
HEMOGLOBIN: 9.7 g/dL — AB (ref 13.0–17.0)
MCH: 29.1 pg (ref 26.0–34.0)
MCHC: 31 g/dL (ref 30.0–36.0)
MCV: 94 fL (ref 78.0–100.0)
PLATELETS: 441 10*3/uL — AB (ref 150–400)
RBC: 3.33 MIL/uL — AB (ref 4.22–5.81)
RDW: 12.8 % (ref 11.5–15.5)
WBC: 8.5 10*3/uL (ref 4.0–10.5)

## 2016-10-25 MED ORDER — POLYETHYLENE GLYCOL 3350 17 G PO PACK
17.0000 g | PACK | Freq: Every day | ORAL | Status: DC | PRN
  Administered 2016-10-25 (×2): 17 g via ORAL
  Filled 2016-10-25 (×2): qty 1

## 2016-10-25 MED ORDER — INDOMETHACIN 25 MG PO CAPS
25.0000 mg | ORAL_CAPSULE | Freq: Two times a day (BID) | ORAL | Status: DC
  Administered 2016-10-26: 25 mg via ORAL
  Filled 2016-10-25 (×2): qty 1

## 2016-10-25 NOTE — Progress Notes (Addendum)
Name: Douglas Kelly MRN: [id number] DOB: 09/17/1970    ADMISSION DATE:  10/22/2016 CONSULTATION DATE:    [name] :  [name], [facility]  CHIEF COMPLAINT:  Shortness of breath and left lung atelectasis/collapse   BRIEF PATIENT DESCRIPTION:   46 year old male w/ h/o Squamous Cell Lung Cancer Stage IIIA diagnosed Dec 2017 w/ LUL collapse d/t centrally obstructing lung mass. He is followed by [name]. He is s/p chemoradiation w/ weekly carboplatin and paclitaxel. Now he is s/p 3 cycles of immunotherapy w/ Durvalumab. This treatment had recently been on hold d/t a hospital admit for pericarditis. He received his 4th infusion on 6/6 and 5th cycle on 13th w/ the plan that should he develop pericarditis again in the future immunotherapy would need to be discontinued.   He presented to the ER at [facility] on 6/15 w/ cc: 3d h/o worsening shortness of breath w/ cough productive of cream colored sputum. In ED he was found to be hypotensive and was meeting SIRS/sepsis criteria. He had a CT chest done that showed  complete opacification of the left hemithorax (compared w/ prior films on record dating back to march 2018 where this was primarily LUL). [facility] been [facility] and pulmonary consulted for recommendations re: airway clearance vs bronch.   SIGNIFICANT EVENTS  Transfer to [facility] over the weekend. States that breathing has improved. Still has persistent nonproductive cough.  STUDIES:  CT chest 6/15: "There is now complete collapse of the left lung. In there is evidence consolidation with fairly small pleural effusion on the left. There is abrupt termination of air in the left main bronchus without appreciable tapering. This finding may be due to progression of radiation therapy change. Given progression of collapse over a three-week time span, there may also be a degree of mucous plugging, although no mass is seen in the left main bronchus" Jasmine December MD)   PAST MEDICAL HISTORY :   has a past medical history of Cancer ([place]); Cancer associated pain (07/27/2016); Collapsed lung; Encounter for antineoplastic chemotherapy (05/14/2016); Goals of care, counseling/discussion (07/27/2016); and Pneumonia.  has a past surgical history that includes Video bronchoscopy (Bilateral, 04/19/2016); Chest tube insertion; Hernia repair; Fracture surgery; and Tympanostomy tube placement. Prior to Admission medications   Medication Sig Start Date End Date Taking? Authorizing Provider  acetaminophen (TYLENOL) 500 MG tablet Take 1,000 mg by mouth every 6 (six) hours as needed for fever.   Yes [provider]  colchicine 0.6 MG tablet Take 1 tablet (0.6 mg total) by mouth 2 (two) times daily. 10/01/16  Yes Charlynne Cousins, MD  Cyanocobalamin (VITAMIN B-12 PO) Take 1 tablet by mouth daily.   Yes [provider]  indomethacin (INDOCIN) 50 MG capsule Take 1 capsule (50 mg total) by mouth 3 (three) times daily with meals. 10/01/16  Yes Charlynne Cousins, MD  Multiple Vitamin (MULTIVITAMIN WITH MINERALS) TABS tablet Take 1 tablet by mouth daily.   Yes [provider]  oxyCODONE-acetaminophen (PERCOCET/ROXICET) 5-325 MG tablet Take 1 tablet by mouth every 6 (six) hours as needed for severe pain. 10/13/16  Yes Curt Bears, MD   No Known Allergies  FAMILY HISTORY:  family history is not on file. SOCIAL HISTORY:  reports that he quit smoking about 6 months ago. His smoking use included Cigarettes. He smoked 1.00 pack per day. He has never used smokeless tobacco. He reports that he does not drink alcohol or use drugs.  REVIEW OF SYSTEMS:  Breathing has improved., Positive for nonproductive cough. Denies hemoptysis.   Constitutional: Negative for fever, chills, weight loss, malaise/fatigue and diaphoresis.  HENT: Negative for hearing loss, ear pain, nosebleeds, congestion, sore throat, neck pain, tinnitus and ear discharge.   Eyes: Negative for blurred vision, double  vision, photophobia, pain, discharge and redness.  Respiratory: Negative for cough,  wheezing and stridor.   Cardiovascular: Negative for chest pain, palpitations, orthopnea, claudication, leg swelling and PND.  Gastrointestinal: Negative for heartburn, nausea, vomiting, abdominal pain, diarrhea, constipation, blood in stool and melena.  Genitourinary: Negative for dysuria, urgency, frequency, hematuria and flank pain.  Musculoskeletal: Negative for myalgias, back pain, joint pain and falls.  Skin: Negative for itching and rash.  Neurological: Negative for dizziness, tingling, tremors, sensory change, speech change, focal weakness, seizures, loss of consciousness, weakness and headaches.  Endo/Heme/Allergies: Negative for environmental allergies and polydipsia. Does not bruise/bleed easily.  SUBJECTIVE:   VITAL SIGNS: Temp:  [98 F (36.7 C)-98.4 F (36.9 C)] 98.1 F (36.7 C) (06/18 0549) Pulse Rate:  [78-96] 82 (06/18 0549) Resp:  [18-20] 18 (06/18 0549) BP: (110-120)/(66-75) 112/75 (06/18 0549) SpO2:  [98 %-100 %] 99 % (06/18 0922)    Intake/Output Summary (Last 24 hours) at 10/25/16 1041 Last data filed at 10/25/16 9892  Gross per 24 hour  Intake           966.25 ml  Output             2680 ml  Net         -1713.75 ml    PHYSICAL EXAMINATION: Gen:      No acute distress HEENT:  EOMI, sclera anicteric Neck:     No masses; no thyromegaly Lungs:    Absent breath sounds over the left; normal respiratory effort CV:         Regular rate and rhythm; no murmurs Abd:      + bowel sounds; soft, non-tender; no palpable masses, no distension Ext:    No edema; adequate peripheral perfusion Skin:      Warm and dry; no rash Neuro: alert and oriented x 3 Psych: normal mood and affect   CBC Recent Labs     10/22/16  1920  10/23/16  0411  10/25/16  0626  WBC  18.4*  15.6*  8.5  HGB  8.6*  9.1*  9.7*  HCT  27.1*  28.9*  31.3*  PLT  377  367  441*    Coag's Recent Labs      10/22/16  1920  APTT  51*  INR  1.54    BMET Recent Labs     10/23/16  0411  10/24/16  1129  10/25/16  0626  NA  135  137  138  K  3.7  4.3  4.2  CL  104  106  105  CO2  23  23  23   BUN  10  7  7   CREATININE  0.89  1.15  1.12  GLUCOSE  197*  154*  118*    Electrolytes Recent Labs     10/23/16  0411  10/24/16  1129  10/25/16  0626  CALCIUM  7.8*  8.4*  8.7*    Sepsis Markers Recent Labs     10/22/16  1920  PROCALCITON  2.19    ABG No results for input(s): PHART, PCO2ART, PO2ART in the last 72 hours.  Liver Enzymes Recent Labs     10/23/16  0411  AST  12*  ALT  22  ALKPHOS  87  BILITOT  0.6  ALBUMIN  2.1*    Cardiac Enzymes No results for input(s): TROPONINI, PROBNP in the last 72 hours.  Glucose No results for input(s): GLUCAP in the last 72 hours.  Imaging Dg Chest 2 View  Result Date: 10/25/2016 CLINICAL DATA:  Atelectatic left lung, shortness of breath, lung malignancy EXAM: CHEST  2 VIEW COMPARISON:  Chest x-ray of October 23, 2016 FINDINGS: There is persistent opacification of the left hemithorax with mild mediastinal shift toward the left. The right lung is clear. The heart is obscured. The bony thorax is unremarkable. IMPRESSION: Stable appearance of the chest with complete opacification of the left hemithorax most compatible with atelectasis secondary to a central obstructing lesion. Electronically Signed   By: David  Martinique M.D.   On: 10/25/2016 08:36   ASSESSMENT / PLAN: Progressive dyspnea in setting of post-obstructive atelectasis +/- Pneumonia.  His left lung collapse is from obstruction with left mainstem tumor. Bronchoscopy in December 2017 showed a completely obstructing mass. He has progressive lung collapse starting with left upper lobe opacification in March 2018  CT scan personally reviewed shows complete opacification of the left hemithorax with abrupt cut off of left main stem and no clear endobronchial debris. Chest x-ray today 6/18  shows persistent left lung collapse. I don't think repeat bronchoscopy would be beneficial as it would not add to his diagnosis or management. Since the lt main stem is completely occluded stenting would be difficult as well.   Recommendations - Continue treating postobstructive pneumonia - Continue hypertonic saline nebs, bronchodilators, flutter valve, incentive spirometry, chest PT - Follow up with Dr. Julien Nordmann for management of Stage IIIA squamous cell lung cancer.  Marshell Garfinkel MD [facility] Pager [phone] If no answer or after 3pm call: [phone] 10/25/2016, 10:48 AM

## 2016-10-25 NOTE — Progress Notes (Signed)
Orders reviewed

## 2016-10-25 NOTE — Progress Notes (Signed)
PROGRESS NOTE    Douglas Kelly  OVZ:[id number] DOB: 07-Jul-1970 DOA: 10/22/2016 PCP: Patient, No Pcp Per  Brief Narrative: Douglas Kelly is a 46 y.o. male with medical history significant of squamous cell lung cancer who was recently in the hospital at the end of May for pericarditis. He was discharged on colchicine and indomethacin and had been doing fairly well. He had followed up with his primary oncologist and restarted chemotherapy, his last treatment being on 10/13/16. He reports that for the past 3 days he's been having intermittent fevers. Since this morning, he has developed worsening shortness of breath and cough productive of cream-colored sputum. CT chest notes Left Lung collapse   Assessment & Plan:   1. Left lung collapse.  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  Postobstructive pneumonia.  -Consolidation noted on chest CT with fever/productive cough -abx and rest as above -improving  3. Recent pericarditis.  -Patient has done well on colchicine and indomethacin. Renal function is normal -cut down indomethacin dose  4. Stage 3 nonsmall cell Lung CA -s/p Chemo/XRT, and recent Chemo 6/6 -followed by Dr.Mohamed  DVT prophylaxis: heparin Code Status: full code Family Communication: no family present Disposition Plan: home tomorrow if stable  Antimicrobials:   vanc/Zosyn   Subjective: Breathing continues to improve, no cough, no chest pain  Objective: Vitals:   10/24/16 1920 10/24/16 2142 10/25/16 0549 10/25/16 0922  BP:  120/75 112/75   Pulse:  78 82   Resp:  18 18   Temp:  98.4 F (36.9 C) 98.1 F (36.7 C)   TempSrc:      SpO2: 99% 99% 100% 99%  Weight:      Height:         Intake/Output Summary (Last 24 hours) at 10/25/16 1410 Last data filed at 10/25/16 4128  Gross per 24 hour  Intake           796.25 ml  Output             2680 ml  Net         -1883.75 ml   Filed Weights   10/22/16 0757 10/22/16 1613  Weight: 69.9 kg (154 lb) 72.8 kg (160 lb 7.9 oz)    Examination:  Gen: Awake, Alert, Oriented X 3,  HEENT: PERRLA, Neck supple, no JVD Lungs: poor air movement on the left CVS: RRR,No Gallops,Rubs or new Murmurs Abd: soft, Non tender, non distended, BS present Extremities: No Cyanosis, Clubbing or edema Skin: no new rashes  Data Reviewed:   CBC:  Recent Labs Lab 10/22/16 0835 10/22/16 1920 10/23/16 0411 10/25/16 0626  WBC 22.4* 18.4* 15.6* 8.5  NEUTROABS 20.3*  --   --   --   HGB 10.9* 8.6* 9.1* 9.7*  HCT 33.3* 27.1* 28.9* 31.3*  MCV 93.8 94.4 94.1 94.0  PLT 437* 377 367 786*   Basic Metabolic Panel:  Recent Labs Lab 10/22/16 0835 10/22/16 1920 10/23/16 0411 10/24/16 1129 10/25/16 0626  NA 135  --  135 137 138  K 4.2  --  3.7 4.3 4.2  CL 102  --  104 106 105  CO2 25  --  23 23 23   GLUCOSE 162*  --  197* 154* 118*  BUN 15  --  10 7 7   CREATININE 0.96 1.10 0.89 1.15 1.12  CALCIUM 8.3*  --  7.8* 8.4* 8.7*   GFR: Estimated Creatinine Clearance: 78 mL/min (by C-G formula based on SCr of 1.12 mg/dL). Liver Function Tests:  Recent Labs Lab 10/22/16 0835 10/23/16 0411  AST 16 12*  ALT 32 22  ALKPHOS 96 87  BILITOT 0.5 0.6  PROT 7.0 5.8*  ALBUMIN 2.8* 2.1*   No results for input(s): LIPASE, AMYLASE in the last 168 hours. No results for input(s): AMMONIA in the last 168 hours. Coagulation Profile:  Recent Labs Lab 10/22/16 1920  INR 1.54   Cardiac Enzymes: No results for input(s): CKTOTAL, CKMB, CKMBINDEX, TROPONINI in the last 168 hours. BNP (last 3 results) No results for input(s): PROBNP in the last 8760 hours. HbA1C: No results for input(s): HGBA1C in the last 72 hours. CBG: No results for input(s):  GLUCAP in the last 168 hours. Lipid Profile: No results for input(s): CHOL, HDL, LDLCALC, TRIG, CHOLHDL, LDLDIRECT in the last 72 hours. Thyroid Function Tests: No results for input(s): TSH, T4TOTAL, FREET4, T3FREE, THYROIDAB in the last 72 hours. Anemia Panel: No results for input(s): VITAMINB12, FOLATE, FERRITIN, TIBC, IRON, RETICCTPCT in the last 72 hours. Urine analysis:    Component Value Date/Time   COLORURINE YELLOW 10/22/2016 1441   APPEARANCEUR CLEAR 10/22/2016 1441   LABSPEC >1.046 (H) 10/22/2016 1441   PHURINE 6.0 10/22/2016 1441   GLUCOSEU NEGATIVE 10/22/2016 1441   HGBUR NEGATIVE 10/22/2016 1441   BILIRUBINUR NEGATIVE 10/22/2016 1441   KETONESUR NEGATIVE 10/22/2016 1441   PROTEINUR 30 (A) 10/22/2016 1441   NITRITE NEGATIVE 10/22/2016 1441   LEUKOCYTESUR NEGATIVE 10/22/2016 1441   Sepsis Labs: @LABRCNTIP (procalcitonin:4,lacticidven:4)  ) Recent Results (from the past 240 hour(s))  Blood Culture (routine x 2)     Status: None (Preliminary result)   Collection Time: 10/22/16  8:35 AM  Result Value Ref Range Status   Specimen Description BLOOD RIGHT ARM  Final   Special Requests   Final    BOTTLES DRAWN AEROBIC AND ANAEROBIC Blood Culture adequate volume   Culture NO GROWTH 3 DAYS  Final   Report Status PENDING  Incomplete  Blood Culture (routine x 2)     Status: None (Preliminary result)   Collection Time: 10/22/16  8:39 AM  Result Value Ref Range Status   Specimen Description BLOOD RIGHT ARM  Final   Special Requests   Final    BOTTLES DRAWN AEROBIC AND ANAEROBIC Blood Culture adequate volume   Culture NO GROWTH 3 DAYS  Final   Report Status PENDING  Incomplete  MRSA PCR Screening     Status: None   Collection Time: 10/22/16  4:26 PM  Result Value Ref Range Status   MRSA by PCR NEGATIVE NEGATIVE Final    Comment:        The GeneXpert MRSA Assay (FDA approved for NASAL specimens only), is one component of a comprehensive MRSA colonization surveillance  program. It is not intended to diagnose MRSA infection nor to guide or monitor treatment for MRSA infections.          Radiology Studies: Dg Chest 2 View  Result Date: 10/25/2016 CLINICAL DATA:  Atelectatic left lung, shortness of breath, lung malignancy EXAM: CHEST  2 VIEW COMPARISON:  Chest x-ray of October 23, 2016 FINDINGS: There is persistent opacification of the left hemithorax with mild mediastinal shift toward the left. The right lung is clear. The heart is obscured. The bony  thorax is unremarkable. IMPRESSION: Stable appearance of the chest with complete opacification of the left hemithorax most compatible with atelectasis secondary to a central obstructing lesion. Electronically Signed   By: David  Martinique M.D.   On: 10/25/2016 08:36        Scheduled Meds: . acetylcysteine  3 mL Nebulization TID  . colchicine  0.6 mg Oral BID  . guaiFENesin  1,200 mg Oral BID  . heparin  5,000 Units Subcutaneous Q8H  . indomethacin  50 mg Oral TID WC  . ipratropium-albuterol  3 mL Nebulization TID   Continuous Infusions: . sodium chloride 75 mL/hr at 10/25/16 0819  . piperacillin-tazobactam (ZOSYN)  IV Stopped (10/25/16 0843)  . sodium chloride     And  . sodium chloride     And  . sodium chloride       LOS: 3 days    Time spent: 75min    Domenic Polite, MD [facility] Pager [phone]  If 7PM-7AM, please contact night-coverage [email or web address] Password [email or web address] 10/25/2016, 2:10 PM

## 2016-10-26 LAB — BASIC METABOLIC PANEL
Anion gap: 10 (ref 5–15)
BUN: 7 mg/dL (ref 6–20)
CHLORIDE: 105 mmol/L (ref 101–111)
CO2: 23 mmol/L (ref 22–32)
Calcium: 8.6 mg/dL — ABNORMAL LOW (ref 8.9–10.3)
Creatinine, Ser: 1.06 mg/dL (ref 0.61–1.24)
GFR calc non Af Amer: >60 mL/min (ref >60)
Glucose, Bld: 120 mg/dL — ABNORMAL HIGH (ref 65–99)
POTASSIUM: 4.5 mmol/L (ref 3.5–5.1)
SODIUM: 138 mmol/L (ref 135–145)

## 2016-10-26 MED ORDER — INDOMETHACIN 25 MG PO CAPS: 25.0000 mg | ORAL_CAPSULE | Freq: Two times a day (BID) | ORAL | 0 refills | Status: AC

## 2016-10-26 MED ORDER — IPRATROPIUM-ALBUTEROL 0.5-2.5 (3) MG/3ML IN SOLN: 3.0000 mL | RESPIRATORY_TRACT | Status: DC | PRN

## 2016-10-26 MED ORDER — OXYCODONE-ACETAMINOPHEN 5-325 MG PO TABS: 2.0000 | ORAL_TABLET | Freq: Four times a day (QID) | ORAL | 0 refills | Status: DC | PRN

## 2016-10-26 MED ORDER — LEVOFLOXACIN 500 MG PO TABS: 500.0000 mg | ORAL_TABLET | Freq: Every day | ORAL | 0 refills | Status: AC

## 2016-10-26 MED ORDER — POLYETHYLENE GLYCOL 3350 17 G PO PACK: 17.0000 g | PACK | Freq: Every day | ORAL | 0 refills | Status: AC | PRN

## 2016-10-26 NOTE — Discharge Summary (Signed)
Physician Discharge Summary  Douglas Kelly ZRA:[id number] DOB: 04/18/1971 DOA: 10/22/2016  PCP: Patient, No Pcp Per  Admit date: 10/22/2016 Discharge date: 10/26/2016  Time spent: 35 minutes  Recommendations for Outpatient Follow-up:  1. PCP in 1week 2. Dr.Mohamed, patient has an appt tomorrow   Discharge Diagnoses:  Active Problems:   Collapse of left lung, due to obstruction by Left mainstem bronchus tumor   Postobstructive pneumonia   Stage III squamous cell carcinoma of left lung ([place])   Pericarditis   Sepsis ([place])   Discharge Condition: stable  Diet recommendation: heart healthy  Filed Weights   10/22/16 0757 10/22/16 1613  Weight: 69.9 kg (154 lb) 72.8 kg (160 lb 7.9 oz)    History of present illness:  Douglas Kelly a 46 y.o.malewith medical history significant of squamous cell lung cancer who was recently in the hospital at the end of May for pericarditis. He was discharged on colchicine and indomethacin and had been doing fairly well. He had followed up with his primary oncologist and restarted chemotherapy, his last treatment being on 10/13/16. He reports that for the past 3 days he's been having intermittent fevers. Since this morning, he has developed worsening shortness of breath and cough productive of cream-colored sputum. CT chest noted Left Lung collapse  Hospital Course:  1. Left lung collapse.  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 -Bronchoscopy in December 2017 showed a completely obstructing mass, he's had progressive lung collapse starting with left upper lobe opacification in March 2018 - treated with IV Vanc/Zosyn, aggressive pulm toilet using Duonebs,mucomist nebs, chest PT - Blood Cx negative, improved, changed to Po levaquin - Pulm consulted, repeat CXR unchanged aeration of L Lung, clinically improved in terms of oxygenation and resp distress despite white out of L lung, pulm felt that repeat Bronch would be of no value -patient was  stabilized and discharged home on PO levaquin to FU with Oncolgy, has an appt with Dr.Mohamed tomorrow 6/20 -symptomatically better and Oxygenation improved  2.  Postobstructive pneumonia.  -Consolidation noted on chest CT with fever/productive cough -abx and rest as above -improving  3. Recent pericarditis.  -Patient has done well on colchicine and indomethacin. Renal function is normal -cut down indomethacin dose  4. Stage 3 nonsmall cell Lung CA -s/p Chemo/XRT, and recent Chemo 6/6 -followed by Dr.Mohamed  Consultations:  Pulmonary  Discharge Exam: Vitals:   10/25/16 2132 10/26/16 0652  BP: 112/67 112/84  Pulse: 83 95  Resp: 18 18  Temp: 97.8 F (36.6 C) 97.7 F (36.5 C)    General: AAOx3 Cardiovascular: S1S2/RRR Respiratory: poor air movement on the left  Discharge Instructions   Discharge Instructions    Diet - low sodium heart healthy    Complete by:  As directed    Increase activity slowly    Complete by:  As directed      Current Discharge Medication List    START taking these medications   Details  levofloxacin (LEVAQUIN) 500 MG tablet Take 1 tablet (500 mg total) by mouth daily. For 5days Qty: 5 tablet, Refills: 0    polyethylene glycol (MIRALAX / GLYCOLAX) packet Take 17 g by mouth daily as needed for moderate constipation. Qty: 14 each, Refills: 0      CONTINUE these medications which have CHANGED   Details  indomethacin (INDOCIN) 25 MG capsule Take 1 capsule (25 mg total) by mouth 2 (two) times daily with a meal. Qty: 30 capsule, Refills: 0  oxyCODONE-acetaminophen (PERCOCET/ROXICET) 5-325 MG tablet Take 2 tablets by mouth every 6 (six) hours as needed for severe pain. Qty: 40 tablet, Refills: 0      CONTINUE these medications which have NOT CHANGED   Details  colchicine 0.6 MG tablet Take 1 tablet (0.6 mg total) by mouth 2 (two) times daily. Qty: 30 tablet, Refills: 0    Cyanocobalamin (VITAMIN B-12 PO) Take 1 tablet by mouth  daily.    Multiple Vitamin (MULTIVITAMIN WITH MINERALS) TABS tablet Take 1 tablet by mouth daily.      STOP taking these medications     acetaminophen (TYLENOL) 500 MG tablet        No Known Allergies Follow-up Information    PCP. Schedule an appointment as soon as possible for a visit in 1 week(s).        Curt Bears, MD On 10/27/2016.   Specialty:  Oncology Contact information: [place] [address] [phone]            The results of significant diagnostics from this hospitalization (including imaging, microbiology, ancillary and laboratory) are listed below for reference.    Significant Diagnostic Studies: Dg Chest 2 View  Result Date: 10/25/2016 CLINICAL DATA:  Atelectatic left lung, shortness of breath, lung malignancy EXAM: CHEST  2 VIEW COMPARISON:  Chest x-ray of October 23, 2016 FINDINGS: There is persistent opacification of the left hemithorax with mild mediastinal shift toward the left. The right lung is clear. The heart is obscured. The bony thorax is unremarkable. IMPRESSION: Stable appearance of the chest with complete opacification of the left hemithorax most compatible with atelectasis secondary to a central obstructing lesion. Electronically Signed   By: David  Martinique M.D.   On: 10/25/2016 08:36   Dg Chest 2 View  Result Date: 10/23/2016 CLINICAL DATA:  Pt brought into the ED yesterday for sob x2 days and productive cough, brown sputum. PT has lung cancer and reports a collapsed left lung and gets chemo txs every 2 weeks, last tx 10/13/16. Hx of lung cancer stage 4, collapsed lung. EXAM: CHEST  2 VIEW COMPARISON:  10/22/2016 chest x-ray and chest CT FINDINGS: There is volume loss and complete opacification of the left hemithorax, stable in appearance. The right lung is clear. No pulmonary edema. There is mild perihilar peribronchial thickening. Trace right pleural effusion. IMPRESSION: Stable appearance of the chest. Volume loss and  complete opacification of the left hemithorax. Electronically Signed   By: Nolon Nations M.D.   On: 10/23/2016 10:38   Dg Chest 2 View  Result Date: 10/22/2016 CLINICAL DATA:  Shortness of breath for 2 days EXAM: CHEST  2 VIEW COMPARISON:  09/29/2016 FINDINGS: Complete opacification of left hemithorax is now seen. Given the recent CT findings this represents a combination of large left pleural effusion and underlying consolidation. Abrupt cut off of the left bronchus is noted. Hyperexpansion of the right lung is seen. No acute bony abnormality is noted. IMPRESSION: Total opacification of left hemithorax as described. Electronically Signed   By: Inez Catalina M.D.   On: 10/22/2016 09:05   Ct Chest W Contrast  Result Date: 10/22/2016 CLINICAL DATA:  Chest pain and shortness breath. Fever. History of lung carcinoma EXAM: CT CHEST WITH CONTRAST TECHNIQUE: Multidetector CT imaging of the chest was performed during intravenous contrast administration. CONTRAST:  73mL ISOVUE-300 IOPAMIDOL (ISOVUE-300) INJECTION 61% COMPARISON:  Chest CT Sep 29, 2016 and chest radiograph October 22, 2016 FINDINGS: Cardiovascular: There is no thoracic aortic aneurysm or  dissection. The visualized great vessels appear unremarkable. There are foci of coronary artery calcification. There is a pericardial effusion, smaller than on most recent CT examination. There is no major vessel pulmonary embolus demonstrated. Mediastinum/Nodes: Visualized thyroid appears normal. There are scattered subcentimeter mediastinal lymph nodes, unchanged from recent study. No adenopathy by size criteria evident. There is a small hiatal hernia. Lungs/Pleura: There is now complete collapse of the left lung with shift of heart and mediastinum toward the left. The previously noted opacification of a portion of the left lower lobe seen on recent CT is no longer evident. There is consolidation throughout the left lung with small pleural effusion on the left  superimposed. On the right, the the lungs hyperexpanded. There is scarring with atelectasis in the medial aspect of the anterior segment of the left upper lobe, also present on recent prior study. Right lung otherwise clear. No appreciable pleural effusion on the right. There is abrupt termination of air in the left main bronchus without tapering. A well-defined mass is not seen by CT in this area. Upper Abdomen: Visualized upper abdominal structures appear unremarkable. Musculoskeletal: There are no blastic or lytic bone lesions. IMPRESSION: There is now complete collapse of the left lung. In there is evidence consolidation with fairly small pleural effusion on the left. There is abrupt termination of air in the left main bronchus without appreciable tapering. This finding may be due to progression of radiation therapy change. Given progression of collapse over a three-week time span, there may also be a degree of mucous plugging, although no mass is seen in the left main bronchus. This finding potentially may warrant bronchoscopy to further evaluate. Right lung hyperexpanded with focal scarring and atelectasis in medial aspect right upper lobe anteriorly. Right lung otherwise clear. Multiple small lymph nodes present without frank adenopathy by size criteria. The current lymph nodes are stable compared to most recent study. There are foci of coronary artery calcification. There is no appreciable vascular calcification or narrowing in the aorta. There is a small hiatal hernia. There is a pericardial effusion, slightly smaller than on recent prior study. Electronically Signed   By: Lowella Grip III M.D.   On: 10/22/2016 12:07   Ct Angio Chest Pe W Or Wo Contrast  Result Date: 09/29/2016 CLINICAL DATA:  Lung cancer diagnosed December 2017. Radiation therapy completed. Chemotherapy ongoing. Shortness of breath and cough. EXAM: CT ANGIOGRAPHY CHEST WITH CONTRAST TECHNIQUE: Multidetector CT imaging of the chest  was performed using the standard protocol during bolus administration of intravenous contrast. Multiplanar CT image reconstructions and MIPs were obtained to evaluate the vascular anatomy. CONTRAST:  100 ml Omnipaque 300. COMPARISON:  Chest CT 07/23/2016 and 05/30/2016. FINDINGS: Cardiovascular: The pulmonary arteries are well opacified with contrast to the level of the subsegmental branches. There is no evidence of acute pulmonary embolism. There is attenuation of the left upper lobe pulmonary artery branches, similar to the previous study. No systemic arterial abnormalities are seen. The heart size is normal. There is a new moderate-sized pericardial effusion. Mediastinum/Nodes: There are no enlarged mediastinal, hilar or axillary lymph nodes. The thyroid gland, trachea and esophagus demonstrate no significant findings. Lungs/Pleura: There are small bilateral pleural effusions. Degree of volume loss in the left hemithorax is similar to the previous study. There is persistent complete opacification of the left upper lobe. There is increased left lower lobe bronchiectasis and peribronchial opacity, likely related to radiation therapy. There is mildly increased atelectasis at the right lung base. These opacities partially  obscure the previously demonstrated small pulmonary nodules. No enlarging nodules are seen. Upper abdomen: The visualized upper abdomen appears stable without suspicious findings. Musculoskeletal/Chest wall: There is no chest wall mass or suspicious osseous finding. Review of the MIP images confirms the above findings. IMPRESSION: 1. No evidence of acute pulmonary embolism. 2. New moderate-sized pericardial effusion and small bilateral pleural effusions. 3. Persistent complete left upper lobe collapse with increasing peribronchial opacities in the left lower lobe, probably related to radiation therapy. Electronically Signed   By: Richardean Sale M.D.   On: 09/29/2016 15:51   Dg Chest Port 1  View  Result Date: 09/28/2016 CLINICAL DATA:  Shortness of breath.  Chest pain. EXAM: PORTABLE CHEST 1 VIEW COMPARISON:  CT 07/23/2016.  Chest x-ray 06/16/2016. FINDINGS: Prominent left upper lobe atelectasis again noted in this patient with known bronchogenic carcinoma. Left-sided pleural effusion. Reference is made to recent CT report for discussion of a nodules present . Heart size cannot be evaluated due to left upper lobe atelectasis. No acute bony abnormality . IMPRESSION: Prominent left upper lobe atelectasis again noted in this patient with known bronchogenic carcinoma. Small left pleural effusion . Electronically Signed   By: Marcello Moores  Register   On: 09/28/2016 11:47    Microbiology: Recent Results (from the past 240 hour(s))  Blood Culture (routine x 2)     Status: None (Preliminary result)   Collection Time: 10/22/16  8:35 AM  Result Value Ref Range Status   Specimen Description BLOOD RIGHT ARM  Final   Special Requests   Final    BOTTLES DRAWN AEROBIC AND ANAEROBIC Blood Culture adequate volume   Culture NO GROWTH 4 DAYS  Final   Report Status PENDING  Incomplete  Blood Culture (routine x 2)     Status: None (Preliminary result)   Collection Time: 10/22/16  8:39 AM  Result Value Ref Range Status   Specimen Description BLOOD RIGHT ARM  Final   Special Requests   Final    BOTTLES DRAWN AEROBIC AND ANAEROBIC Blood Culture adequate volume   Culture NO GROWTH 4 DAYS  Final   Report Status PENDING  Incomplete  MRSA PCR Screening     Status: None   Collection Time: 10/22/16  4:26 PM  Result Value Ref Range Status   MRSA by PCR NEGATIVE NEGATIVE Final    Comment:        The GeneXpert MRSA Assay (FDA approved for NASAL specimens only), is one component of a comprehensive MRSA colonization surveillance program. It is not intended to diagnose MRSA infection nor to guide or monitor treatment for MRSA infections.      Labs: Basic Metabolic Panel:  Recent Labs Lab  10/22/16 0835 10/22/16 1920 10/23/16 0411 10/24/16 1129 10/25/16 0626 10/26/16 0849  NA 135  --  135 137 138 138  K 4.2  --  3.7 4.3 4.2 4.5  CL 102  --  104 106 105 105  CO2 25  --  23 23 23 23   GLUCOSE 162*  --  197* 154* 118* 120*  BUN 15  --  10 7 7 7   CREATININE 0.96 1.10 0.89 1.15 1.12 1.06  CALCIUM 8.3*  --  7.8* 8.4* 8.7* 8.6*   Liver Function Tests:  Recent Labs Lab 10/22/16 0835 10/23/16 0411  AST 16 12*  ALT 32 22  ALKPHOS 96 87  BILITOT 0.5 0.6  PROT 7.0 5.8*  ALBUMIN 2.8* 2.1*   No results for input(s): LIPASE, AMYLASE in the last 168  hours. No results for input(s): AMMONIA in the last 168 hours. CBC:  Recent Labs Lab 10/22/16 0835 10/22/16 1920 10/23/16 0411 10/25/16 0626  WBC 22.4* 18.4* 15.6* 8.5  NEUTROABS 20.3*  --   --   --   HGB 10.9* 8.6* 9.1* 9.7*  HCT 33.3* 27.1* 28.9* 31.3*  MCV 93.8 94.4 94.1 94.0  PLT 437* 377 367 441*   Cardiac Enzymes: No results for input(s): CKTOTAL, CKMB, CKMBINDEX, TROPONINI in the last 168 hours. BNP: BNP (last 3 results) No results for input(s): BNP in the last 8760 hours.  ProBNP (last 3 results) No results for input(s): PROBNP in the last 8760 hours.  CBG: No results for input(s): GLUCAP in the last 168 hours.     SignedDomenic Polite MD.  [facility] 10/26/2016, 1:26 PM

## 2016-10-26 NOTE — Progress Notes (Signed)
Patient discharge teaching given, including activity, diet, follow-up appoints, and medications. Patient verbalized understanding of all discharge instructions. IV access was d/c'd. Vitals are stable. Skin is intact except as charted in most recent assessments. Pt to be escorted out by Student nurse, to be driven home by family.

## 2016-10-26 NOTE — Progress Notes (Signed)
RT assessment done at this time. BBS clear, slightly diminished in bases. Patient has been using flutter on his own and doing great.  Changed treatments to PRN and patient said he would call if needed

## 2016-10-27 ENCOUNTER — Telehealth: Payer: Self-pay | Admitting: Internal Medicine

## 2016-10-27 ENCOUNTER — Telehealth: Payer: Self-pay | Admitting: *Deleted

## 2016-10-27 ENCOUNTER — Encounter: Payer: Self-pay | Admitting: Internal Medicine

## 2016-10-27 ENCOUNTER — Ambulatory Visit: Payer: Medicaid Other

## 2016-10-27 ENCOUNTER — Ambulatory Visit (HOSPITAL_BASED_OUTPATIENT_CLINIC_OR_DEPARTMENT_OTHER): Payer: Medicaid Other | Admitting: Internal Medicine

## 2016-10-27 ENCOUNTER — Other Ambulatory Visit (HOSPITAL_BASED_OUTPATIENT_CLINIC_OR_DEPARTMENT_OTHER): Payer: Medicaid Other

## 2016-10-27 VITALS — BP 128/87 | HR 101 | Temp 98.0°F | Resp 18 | Ht 68.0 in | Wt 152.8 lb

## 2016-10-27 DIAGNOSIS — C3492 Malignant neoplasm of unspecified part of left bronchus or lung: Secondary | ICD-10-CM

## 2016-10-27 DIAGNOSIS — G893 Neoplasm related pain (acute) (chronic): Secondary | ICD-10-CM | POA: Diagnosis not present

## 2016-10-27 DIAGNOSIS — R5383 Other fatigue: Secondary | ICD-10-CM

## 2016-10-27 DIAGNOSIS — R5382 Chronic fatigue, unspecified: Secondary | ICD-10-CM

## 2016-10-27 DIAGNOSIS — J189 Pneumonia, unspecified organism: Secondary | ICD-10-CM

## 2016-10-27 DIAGNOSIS — Z7189 Other specified counseling: Secondary | ICD-10-CM

## 2016-10-27 DIAGNOSIS — Z5112 Encounter for antineoplastic immunotherapy: Secondary | ICD-10-CM

## 2016-10-27 DIAGNOSIS — D649 Anemia, unspecified: Secondary | ICD-10-CM

## 2016-10-27 LAB — CBC WITH DIFFERENTIAL/PLATELET
BASO%: 0.5 % (ref 0.0–2.0)
BASOS ABS: 0.1 10*3/uL (ref 0.0–0.1)
EOS%: 0.6 % (ref 0.0–7.0)
Eosinophils Absolute: 0.1 10*3/uL (ref 0.0–0.5)
HEMATOCRIT: 32.5 % — AB (ref 38.4–49.9)
HGB: 10.8 g/dL — ABNORMAL LOW (ref 13.0–17.1)
LYMPH#: 0.5 10*3/uL — AB (ref 0.9–3.3)
LYMPH%: 4.3 % — ABNORMAL LOW (ref 14.0–49.0)
MCH: 30.3 pg (ref 27.2–33.4)
MCHC: 33.2 g/dL (ref 32.0–36.0)
MCV: 91.5 fL (ref 79.3–98.0)
MONO#: 0.7 10*3/uL (ref 0.1–0.9)
MONO%: 5.7 % (ref 0.0–14.0)
NEUT#: 10.6 10*3/uL — ABNORMAL HIGH (ref 1.5–6.5)
NEUT%: 88.9 % — AB (ref 39.0–75.0)
PLATELETS: 582 10*3/uL — AB (ref 140–400)
RBC: 3.55 10*6/uL — ABNORMAL LOW (ref 4.20–5.82)
RDW: 13.5 % (ref 11.0–14.6)
WBC: 11.9 10*3/uL — ABNORMAL HIGH (ref 4.0–10.3)

## 2016-10-27 LAB — CULTURE, BLOOD (ROUTINE X 2)
CULTURE: NO GROWTH
CULTURE: NO GROWTH
SPECIAL REQUESTS: ADEQUATE
SPECIAL REQUESTS: ADEQUATE

## 2016-10-27 LAB — COMPREHENSIVE METABOLIC PANEL
ALT: 67 U/L — ABNORMAL HIGH (ref 0–55)
ANION GAP: 13 meq/L — AB (ref 3–11)
AST: 27 U/L (ref 5–34)
Albumin: 2.5 g/dL — ABNORMAL LOW (ref 3.5–5.0)
Alkaline Phosphatase: 204 U/L — ABNORMAL HIGH (ref 40–150)
BUN: 9.8 mg/dL (ref 7.0–26.0)
CALCIUM: 9.6 mg/dL (ref 8.4–10.4)
CO2: 26 mEq/L (ref 22–29)
Chloride: 103 mEq/L (ref 98–109)
Creatinine: 1.1 mg/dL (ref 0.7–1.3)
EGFR: 82 mL/min/{1.73_m2} — ABNORMAL LOW (ref >90)
Glucose: 163 mg/dl — ABNORMAL HIGH (ref 70–140)
POTASSIUM: 4.1 meq/L (ref 3.5–5.1)
Sodium: 142 mEq/L (ref 136–145)
Total Bilirubin: 0.25 mg/dL (ref 0.20–1.20)
Total Protein: 7.1 g/dL (ref 6.4–8.3)

## 2016-10-27 MED ORDER — OXYCODONE-ACETAMINOPHEN 5-325 MG PO TABS: 2.0000 | ORAL_TABLET | Freq: Four times a day (QID) | ORAL | 0 refills | Status: AC | PRN

## 2016-10-27 MED ORDER — FENTANYL 25 MCG/HR TD PT72: 25.0000 ug | MEDICATED_PATCH | TRANSDERMAL | 0 refills | Status: AC

## 2016-10-27 NOTE — Progress Notes (Signed)
[place] [phone]   [phone]  OFFICE PROGRESS NOTE  Patient, No Pcp Per No address on file  DIAGNOSIS: Stage IIIA (T2a, N2, M0) non-small cell lung cancer, squamous cell carcinoma diagnosed in December 2017 with almost complete collapse of the left lung.  PRIOR THERAPY: Concurrent chemoradiation with weekly carboplatin for AUC of 2 and paclitaxel 45 MG/M2. The patient is status post 6 weeks of radiation but only 2 cycles of chemotherapy because he missed several appointments.  CURRENT THERAPY: Immunotherapy with Imfinzi (Durvalumab) 10 MG/KG every 2 weeks, first dose 08/04/2016. Status post 4 cycles.  INTERVAL HISTORY: Douglas Kelly 46 y.o. male returns to the clinic today for follow-up visit accompanied by his girlfriend. The patient continues to complain of increasing fatigue and weakness as well as shortness of breath and cough productive of greenish sputum. He was recently admitted to [facility] with shortness of breath and he has complete collapse of the left lung. He was discharged yesterday. He has no current fever or chills. He has no nausea or vomiting. He continues to have pain on the left side of the chest but no hemoptysis. He denied having any recent weight loss or night sweats. He is here today for evaluation and discussion of his treatment options.   MEDICAL HISTORY: Past Medical History:  Diagnosis Date  . Cancer ([place])    lung   . 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 Collapsed lung   . Encounter for antineoplastic chemotherapy 05/14/2016  . Goals of care, counseling/discussion 07/27/2016  . Pneumonia     ALLERGIES:  has No Known Allergies.  MEDICATIONS:  Current Outpatient Prescriptions  Medication Sig Dispense Refill  . colchicine 0.6 MG tablet Take 1 tablet (0.6 mg total) by mouth 2 (two) times daily. 30 tablet 0  . Cyanocobalamin (VITAMIN B-12 PO) Take 1 tablet by mouth daily.    . indomethacin (INDOCIN) 25  MG capsule Take 1 capsule (25 mg total) by mouth 2 (two) times daily with a meal. 30 capsule 0  . levofloxacin (LEVAQUIN) 500 MG tablet Take 1 tablet (500 mg total) by mouth daily. For 5days 5 tablet 0  . Multiple Vitamin (MULTIVITAMIN WITH MINERALS) TABS tablet Take 1 tablet by mouth daily.    Marland Kitchen oxyCODONE-acetaminophen (PERCOCET/ROXICET) 5-325 MG tablet Take 2 tablets by mouth every 6 (six) hours as needed for severe pain. 40 tablet 0  . polyethylene glycol (MIRALAX / GLYCOLAX) packet Take 17 g by mouth daily as needed for moderate constipation. 14 each 0   No current facility-administered medications for this visit.     SURGICAL HISTORY:  Past Surgical History:  Procedure Laterality Date  . CHEST TUBE INSERTION    . FRACTURE SURGERY    . HERNIA REPAIR    . TYMPANOSTOMY TUBE PLACEMENT    . VIDEO BRONCHOSCOPY Bilateral 04/19/2016   Procedure: VIDEO BRONCHOSCOPY WITHOUT FLUORO;  Surgeon: Juanito Doom, MD;  Location: [facility];  Service: Cardiopulmonary;  Laterality: Bilateral;    REVIEW OF SYSTEMS:  Constitutional: positive for fatigue and weight loss Eyes: negative Ears, nose, mouth, throat, and face: negative Respiratory: positive for cough, dyspnea on exertion, pleurisy/chest pain and sputum Cardiovascular: negative Gastrointestinal: negative Genitourinary:negative Integument/breast: negative Hematologic/lymphatic: negative Musculoskeletal:negative Neurological: negative Behavioral/Psych: negative Endocrine: negative Allergic/Immunologic: negative   PHYSICAL EXAMINATION: General appearance: alert, cooperative, fatigued and no distress Head: Normocephalic, without obvious abnormality, atraumatic Neck: no adenopathy, no JVD, supple, symmetrical, trachea midline and thyroid not enlarged,  symmetric, no tenderness/mass/nodules Lymph nodes: Cervical, supraclavicular, and axillary nodes normal. Resp: diminished breath sounds LUL and dullness to percussion LUL Back:  symmetric, no curvature. ROM normal. No CVA tenderness. Cardio: regular rate and rhythm, S1, S2 normal, no murmur, click, rub or gallop GI: soft, non-tender; bowel sounds normal; no masses,  no organomegaly Extremities: extremities normal, atraumatic, no cyanosis or edema Neurologic: Alert and oriented X 3, normal strength and tone. Normal symmetric reflexes. Normal coordination and gait  ECOG PERFORMANCE STATUS: 1 - Symptomatic but completely ambulatory  Blood pressure 128/87, pulse (!) 101, temperature 98 F (36.7 C), temperature source Oral, resp. rate 18, height 5\' 8"  (1.727 m), weight 152 lb 12.8 oz (69.3 kg), SpO2 97 %.  LABORATORY DATA: Lab Results  Component Value Date   WBC 11.9 (H) 10/27/2016   HGB 10.8 (L) 10/27/2016   HCT 32.5 (L) 10/27/2016   MCV 91.5 10/27/2016   PLT 582 (H) 10/27/2016      Chemistry      Component Value Date/Time   NA 142 10/27/2016 0904   K 4.1 10/27/2016 0904   CL 105 10/26/2016 0849   CO2 26 10/27/2016 0904   BUN 9.8 10/27/2016 0904   CREATININE 1.1 10/27/2016 0904      Component Value Date/Time   CALCIUM 9.6 10/27/2016 0904   ALKPHOS 204 (H) 10/27/2016 0904   AST 27 10/27/2016 0904   ALT 67 (H) 10/27/2016 0904   BILITOT 0.25 10/27/2016 0904       RADIOGRAPHIC STUDIES: Dg Chest 2 View  Result Date: 10/25/2016 CLINICAL DATA:  Atelectatic left lung, shortness of breath, lung malignancy EXAM: CHEST  2 VIEW COMPARISON:  Chest x-ray of October 23, 2016 FINDINGS: There is persistent opacification of the left hemithorax with mild mediastinal shift toward the left. The right lung is clear. The heart is obscured. The bony thorax is unremarkable. IMPRESSION: Stable appearance of the chest with complete opacification of the left hemithorax most compatible with atelectasis secondary to a central obstructing lesion. Electronically Signed   By: David  Martinique M.D.   On: 10/25/2016 08:36   Dg Chest 2 View  Result Date: 10/23/2016 CLINICAL DATA:  Pt  brought into the ED yesterday for sob x2 days and productive cough, brown sputum. PT has lung cancer and reports a collapsed left lung and gets chemo txs every 2 weeks, last tx 10/13/16. Hx of lung cancer stage 4, collapsed lung. EXAM: CHEST  2 VIEW COMPARISON:  10/22/2016 chest x-ray and chest CT FINDINGS: There is volume loss and complete opacification of the left hemithorax, stable in appearance. The right lung is clear. No pulmonary edema. There is mild perihilar peribronchial thickening. Trace right pleural effusion. IMPRESSION: Stable appearance of the chest. Volume loss and complete opacification of the left hemithorax. Electronically Signed   By: Nolon Nations M.D.   On: 10/23/2016 10:38   Dg Chest 2 View  Result Date: 10/22/2016 CLINICAL DATA:  Shortness of breath for 2 days EXAM: CHEST  2 VIEW COMPARISON:  09/29/2016 FINDINGS: Complete opacification of left hemithorax is now seen. Given the recent CT findings this represents a combination of large left pleural effusion and underlying consolidation. Abrupt cut off of the left bronchus is noted. Hyperexpansion of the right lung is seen. No acute bony abnormality is noted. IMPRESSION: Total opacification of left hemithorax as described. Electronically Signed   By: Inez Catalina M.D.   On: 10/22/2016 09:05   Ct Chest W Contrast  Result Date: 10/22/2016 CLINICAL DATA:  Chest pain and shortness breath. Fever. History of lung carcinoma EXAM: CT CHEST WITH CONTRAST TECHNIQUE: Multidetector CT imaging of the chest was performed during intravenous contrast administration. CONTRAST:  33mL ISOVUE-300 IOPAMIDOL (ISOVUE-300) INJECTION 61% COMPARISON:  Chest CT Sep 29, 2016 and chest radiograph October 22, 2016 FINDINGS: Cardiovascular: There is no thoracic aortic aneurysm or dissection. The visualized great vessels appear unremarkable. There are foci of coronary artery calcification. There is a pericardial effusion, smaller than on most recent CT examination. There  is no major vessel pulmonary embolus demonstrated. Mediastinum/Nodes: Visualized thyroid appears normal. There are scattered subcentimeter mediastinal lymph nodes, unchanged from recent study. No adenopathy by size criteria evident. There is a small hiatal hernia. Lungs/Pleura: There is now complete collapse of the left lung with shift of heart and mediastinum toward the left. The previously noted opacification of a portion of the left lower lobe seen on recent CT is no longer evident. There is consolidation throughout the left lung with small pleural effusion on the left superimposed. On the right, the the lungs hyperexpanded. There is scarring with atelectasis in the medial aspect of the anterior segment of the left upper lobe, also present on recent prior study. Right lung otherwise clear. No appreciable pleural effusion on the right. There is abrupt termination of air in the left main bronchus without tapering. A well-defined mass is not seen by CT in this area. Upper Abdomen: Visualized upper abdominal structures appear unremarkable. Musculoskeletal: There are no blastic or lytic bone lesions. IMPRESSION: There is now complete collapse of the left lung. In there is evidence consolidation with fairly small pleural effusion on the left. There is abrupt termination of air in the left main bronchus without appreciable tapering. This finding may be due to progression of radiation therapy change. Given progression of collapse over a three-week time span, there may also be a degree of mucous plugging, although no mass is seen in the left main bronchus. This finding potentially may warrant bronchoscopy to further evaluate. Right lung hyperexpanded with focal scarring and atelectasis in medial aspect right upper lobe anteriorly. Right lung otherwise clear. Multiple small lymph nodes present without frank adenopathy by size criteria. The current lymph nodes are stable compared to most recent study. There are foci of  coronary artery calcification. There is no appreciable vascular calcification or narrowing in the aorta. There is a small hiatal hernia. There is a pericardial effusion, slightly smaller than on recent prior study. Electronically Signed   By: Lowella Grip III M.D.   On: 10/22/2016 12:07   Ct Angio Chest Pe W Or Wo Contrast  Result Date: 09/29/2016 CLINICAL DATA:  Lung cancer diagnosed December 2017. Radiation therapy completed. Chemotherapy ongoing. Shortness of breath and cough. EXAM: CT ANGIOGRAPHY CHEST WITH CONTRAST TECHNIQUE: Multidetector CT imaging of the chest was performed using the standard protocol during bolus administration of intravenous contrast. Multiplanar CT image reconstructions and MIPs were obtained to evaluate the vascular anatomy. CONTRAST:  100 ml Omnipaque 300. COMPARISON:  Chest CT 07/23/2016 and 05/30/2016. FINDINGS: Cardiovascular: The pulmonary arteries are well opacified with contrast to the level of the subsegmental branches. There is no evidence of acute pulmonary embolism. There is attenuation of the left upper lobe pulmonary artery branches, similar to the previous study. No systemic arterial abnormalities are seen. The heart size is normal. There is a new moderate-sized pericardial effusion. Mediastinum/Nodes: There are no enlarged mediastinal, hilar or axillary lymph nodes. The thyroid gland, trachea and esophagus demonstrate no significant findings. Lungs/Pleura:  There are small bilateral pleural effusions. Degree of volume loss in the left hemithorax is similar to the previous study. There is persistent complete opacification of the left upper lobe. There is increased left lower lobe bronchiectasis and peribronchial opacity, likely related to radiation therapy. There is mildly increased atelectasis at the right lung base. These opacities partially obscure the previously demonstrated small pulmonary nodules. No enlarging nodules are seen. Upper abdomen: The visualized  upper abdomen appears stable without suspicious findings. Musculoskeletal/Chest wall: There is no chest wall mass or suspicious osseous finding. Review of the MIP images confirms the above findings. IMPRESSION: 1. No evidence of acute pulmonary embolism. 2. New moderate-sized pericardial effusion and small bilateral pleural effusions. 3. Persistent complete left upper lobe collapse with increasing peribronchial opacities in the left lower lobe, probably related to radiation therapy. Electronically Signed   By: Richardean Sale M.D.   On: 09/29/2016 15:51   Dg Chest Port 1 View  Result Date: 09/28/2016 CLINICAL DATA:  Shortness of breath.  Chest pain. EXAM: PORTABLE CHEST 1 VIEW COMPARISON:  CT 07/23/2016.  Chest x-ray 06/16/2016. FINDINGS: Prominent left upper lobe atelectasis again noted in this patient with known bronchogenic carcinoma. Left-sided pleural effusion. Reference is made to recent CT report for discussion of a nodules present . Heart size cannot be evaluated due to left upper lobe atelectasis. No acute bony abnormality . IMPRESSION: Prominent left upper lobe atelectasis again noted in this patient with known bronchogenic carcinoma. Small left pleural effusion . Electronically Signed   By: Marcello Moores  Register   On: 09/28/2016 11:47    ASSESSMENT AND PLAN:  This is a very pleasant 46 years old white male with stage IIIa non-small cell lung cancer, squamous cell carcinoma with central nonobstructing left lung mass as well as mediastinal lymphadenopathy diagnosed in December 2017 status post a course of concurrent chemoradiation with weekly carboplatin and paclitaxel and currently undergoing consolidation immunotherapy with Imfinzi (Durvalumab) status post 4 cycles. His treatment was complicated with pericarditis. Recent imaging studies showed complete collapse of the left lung. Unfortunately the patient is not receiving any benefit from his current treatment. I had a lengthy discussion with the  patient and his girlfriend about his current condition and treatment options. He is not doing well and has no benefit from the treatment. I strongly recommended for him to consider palliative care and hospice at this point. The patient and his girlfriend are in agreement with the current plan. For pain management, I will add fentanyl patch 25 g/hour every 3 days in addition to Percocet. He will be seen on as-needed basis at this point. He was advised to call if he has any concerning symptoms in the interval. The patient voices understanding of current disease status and treatment options and is in agreement with the current care plan. All questions were answered. The patient knows to call the clinic with any problems, questions or concerns. We can certainly see the patient much sooner if necessary.  Disclaimer: This note was dictated with voice recognition software. Similar sounding words can inadvertently be transcribed and may not be corrected upon review.

## 2016-10-27 NOTE — Telephone Encounter (Signed)
No additional appts added - cancelled per 6/20 los -Please cancel all lab, chemo and follow up visits.

## 2016-10-27 NOTE — Telephone Encounter (Signed)
Referral to [facility] made for pt. Spoke with Larena Glassman, faxed copy of demographics, progress notes, med list to [phone]

## 2016-10-28 ENCOUNTER — Other Ambulatory Visit (HOSPITAL_COMMUNITY): Payer: Medicaid Other

## 2016-11-11 ENCOUNTER — Ambulatory Visit: Payer: Medicaid Other

## 2016-11-11 ENCOUNTER — Ambulatory Visit: Payer: Medicaid Other | Admitting: Internal Medicine

## 2016-11-11 ENCOUNTER — Other Ambulatory Visit: Payer: Medicaid Other

## 2016-11-24 ENCOUNTER — Ambulatory Visit: Payer: Medicaid Other | Admitting: Internal Medicine

## 2016-11-24 ENCOUNTER — Ambulatory Visit: Payer: Medicaid Other

## 2016-11-24 ENCOUNTER — Other Ambulatory Visit: Payer: Medicaid Other

## 2017-01-28 ENCOUNTER — Telehealth: Payer: Self-pay | Admitting: *Deleted

## 2017-01-28 NOTE — Telephone Encounter (Signed)
ppwk from [facility] rcvd requesting re certification for Hospice services. Called [facility] for clarification.pt is still doing as well as can be expected and after 3 months " re certification is needed to continue hospice services"

## 2017-02-01 ENCOUNTER — Other Ambulatory Visit: Payer: Self-pay | Admitting: Internal Medicine

## 2017-02-11 ENCOUNTER — Emergency Department (HOSPITAL_COMMUNITY)
Admission: EM | Admit: 2017-02-11 | Discharge: 2017-02-11 | Disposition: A | Discharge status: Home / Self Care | Payer: Medicaid Other | Attending: Emergency Medicine | Admitting: Emergency Medicine

## 2017-02-11 ENCOUNTER — Encounter (HOSPITAL_COMMUNITY): Payer: Self-pay | Admitting: Emergency Medicine

## 2017-02-11 DIAGNOSIS — R2240 Localized swelling, mass and lump, unspecified lower limb: Secondary | ICD-10-CM | POA: Diagnosis present

## 2017-02-11 DIAGNOSIS — Z5321 Procedure and treatment not carried out due to patient leaving prior to being seen by health care provider: Secondary | ICD-10-CM | POA: Diagnosis not present

## 2017-02-11 NOTE — ED Notes (Signed)
Pt told registration he was leaving

## 2017-02-11 NOTE — ED Triage Notes (Addendum)
Pt reports he has had a knot on his upper R leg for the past 2 months that is gradually getting larger. No known injury. No redness noted. Knot is about 4" by 3".  Pt has hx of cancer, but has not been on chemo for some time.

## 2017-03-09 ENCOUNTER — Telehealth: Payer: Self-pay | Admitting: Medical Oncology

## 2017-03-09 NOTE — Telephone Encounter (Signed)
Dr Sonny Dandy is prescribing pain meds.

## 2017-03-18 ENCOUNTER — Encounter (HOSPITAL_COMMUNITY): Payer: Self-pay | Admitting: *Deleted

## 2017-03-18 ENCOUNTER — Emergency Department (HOSPITAL_COMMUNITY)
Admission: EM | Admit: 2017-03-18 | Discharge: 2017-03-18 | Disposition: A | Discharge status: Home / Self Care | Payer: Medicaid Other | Attending: Emergency Medicine | Admitting: Emergency Medicine

## 2017-03-18 ENCOUNTER — Emergency Department (HOSPITAL_COMMUNITY): Payer: Medicaid Other

## 2017-03-18 ENCOUNTER — Other Ambulatory Visit: Payer: Self-pay

## 2017-03-18 DIAGNOSIS — Z87891 Personal history of nicotine dependence: Secondary | ICD-10-CM | POA: Diagnosis not present

## 2017-03-18 DIAGNOSIS — Z85118 Personal history of other malignant neoplasm of bronchus and lung: Secondary | ICD-10-CM | POA: Diagnosis not present

## 2017-03-18 DIAGNOSIS — R2241 Localized swelling, mass and lump, right lower limb: Secondary | ICD-10-CM | POA: Insufficient documentation

## 2017-03-18 DIAGNOSIS — Z79899 Other long term (current) drug therapy: Secondary | ICD-10-CM | POA: Insufficient documentation

## 2017-03-18 LAB — BASIC METABOLIC PANEL
Anion gap: 8 (ref 5–15)
BUN: 14 mg/dL (ref 6–20)
CO2: 29 mmol/L (ref 22–32)
CREATININE: 0.88 mg/dL (ref 0.61–1.24)
Calcium: 12.8 mg/dL — ABNORMAL HIGH (ref 8.9–10.3)
Chloride: 98 mmol/L — ABNORMAL LOW (ref 101–111)
GFR calc Af Amer: >60 mL/min (ref >60)
GLUCOSE: 97 mg/dL (ref 65–99)
Potassium: 4.4 mmol/L (ref 3.5–5.1)
SODIUM: 135 mmol/L (ref 135–145)

## 2017-03-18 LAB — CBC WITH DIFFERENTIAL/PLATELET
BASOS ABS: 0 10*3/uL (ref 0.0–0.1)
Basophils Relative: 0 %
EOS PCT: 2 %
Eosinophils Absolute: 0.2 10*3/uL (ref 0.0–0.7)
HCT: 31.5 % — ABNORMAL LOW (ref 39.0–52.0)
Hemoglobin: 10.1 g/dL — ABNORMAL LOW (ref 13.0–17.0)
Lymphocytes Relative: 6 %
Lymphs Abs: 0.5 10*3/uL — ABNORMAL LOW (ref 0.7–4.0)
MCH: 29.2 pg (ref 26.0–34.0)
MCHC: 32.1 g/dL (ref 30.0–36.0)
MCV: 91 fL (ref 78.0–100.0)
MONO ABS: 1.1 10*3/uL — AB (ref 0.1–1.0)
Monocytes Relative: 12 %
Neutro Abs: 7.3 10*3/uL (ref 1.7–7.7)
Neutrophils Relative %: 80 %
PLATELETS: 363 10*3/uL (ref 150–400)
RBC: 3.46 MIL/uL — AB (ref 4.22–5.81)
RDW: 13.7 % (ref 11.5–15.5)
WBC: 9.2 10*3/uL (ref 4.0–10.5)

## 2017-03-18 MED ORDER — SODIUM CHLORIDE 0.9 % IV BOLUS (SEPSIS)
1000.0000 mL | Freq: Once | INTRAVENOUS | Status: AC
  Administered 2017-03-18: 1000 mL via INTRAVENOUS

## 2017-03-18 NOTE — ED Notes (Signed)
hospice called and  Stats the family of mr Scurlock was to call them when he went to ER and they did not , there was a worry that the family was diverting his pain meds as he had had an increase in the amt  And it was running out to  Soon, dr Regenia Skeeter listened  And is aware

## 2017-03-18 NOTE — ED Notes (Signed)
States has had swelling on his upper rt leg x 3 months states has seen a dr but they do not know what it is, states has stage 4 cancer and has a nurse that comes to see hin sveral times a day and she told him to come  In toi have it checked out

## 2017-03-18 NOTE — Discharge Instructions (Addendum)
You will likely need an MRI of your right thigh to diagnose the soft tissue swelling.  This is probably some sort of soft tissue mass or lipoma.  Also you have too much calcium, called hypercalcemia.  This can come along with cancer, drink plenty of fluids and follow-up with your oncologist.

## 2017-03-18 NOTE — ED Triage Notes (Signed)
Pt reports area in the right lateral upper thigh that is swollen and painful, first started 3 months ago. Pain is worse tonight. Pt has been seen and r/o dvt prior for the same. Last Roxicodone 30 mg 2 hours ago, Fentanyl patch on his back.

## 2017-03-18 NOTE — ED Provider Notes (Signed)
[facility] EMERGENCY DEPARTMENT Provider Note   CSN: [id number] Arrival date & time: 03/18/17  0546     History   Chief Complaint Chief Complaint  Patient presents with  . Leg Pain    HPI Douglas Kelly is a 46 y.o. male.  HPI  46 year old male with a history of stage IV lung cancer presents with right thigh pain and swelling for about 3 months.  Pain seems to wax and wane and the swelling goes up and down depending on if he puts ice or elevates his leg.  He was seen at [facility] in September and states they did an ultrasound to rule out blood clot and this was negative.  However he has not received an answer for what this is.  He is able to ambulate but it is painful.  There is no weakness or numbness in his extremities.  No injury to this area.  He states that some of the cancer is in his lymph nodes in his groin.  He is currently on oxycodone and fentanyl patch. Pain is severe  Past Medical History:  Diagnosis Date  . Cancer ([place])    lung   . 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 Collapsed lung   . Encounter for antineoplastic chemotherapy 05/14/2016  . Goals of care, counseling/discussion 07/27/2016  . Pneumonia     Patient Active Problem List   Diagnosis Date Noted  . Sepsis ([place]) 10/22/2016  . Pericarditis 09/28/2016  . Goals of care, counseling/discussion 07/27/2016  . Encounter for antineoplastic immunotherapy 07/27/2016  . 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 Hypersensitivity reaction 06/08/2016  . Pneumothorax 05/22/2016  . 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 Encounter for antineoplastic chemotherapy 05/14/2016  . Protein-calorie malnutrition, severe 04/17/2016  . Collapse of left lung, hx of pneumonia 5 months ago 04/16/2016  . Pneumonia of left upper lobe due to infectious organism ([place])   . Postobstructive pneumonia 04/15/2016  . Normocytic anemia 04/15/2016  . LFTs abnormal 04/15/2016  . Smoker 04/15/2016     Past Surgical History:  Procedure Laterality Date  . CHEST TUBE INSERTION    . FRACTURE SURGERY    . HERNIA REPAIR    . TYMPANOSTOMY TUBE PLACEMENT         Home Medications    Prior to Admission medications   Medication Sig Start Date End Date Taking? Authorizing Provider  colchicine 0.6 MG tablet Take 1 tablet (0.6 mg total) by mouth 2 (two) times daily. 10/01/16  Yes Charlynne Cousins, MD  Cyanocobalamin (VITAMIN B-12 PO) Take 1 tablet by mouth daily.   Yes [provider]  fentaNYL (DURAGESIC - DOSED MCG/HR) 100 MCG/HR Place 100 mcg every 3 (three) days onto the skin.   Yes [provider]  fentaNYL (DURAGESIC - DOSED MCG/HR) 50 MCG/HR Place 50 mcg every 3 (three) days onto the skin.   Yes [provider]  indomethacin (INDOCIN) 25 MG capsule Take 1 capsule (25 mg total) by mouth 2 (two) times daily with a meal. 10/26/16  Yes Domenic Polite, MD  Multiple Vitamin (MULTIVITAMIN WITH MINERALS) TABS tablet Take 1 tablet by mouth daily.   Yes [provider]  polyethylene glycol (MIRALAX / GLYCOLAX) packet Take 17 g by mouth daily as needed for moderate constipation. 10/26/16  Yes Domenic Polite, MD  fentaNYL (DURAGESIC - DOSED MCG/HR) 25 MCG/HR patch Place 1 patch (25 mcg total) onto the skin every 3 (three) days. 10/27/16  Curt Bears, MD  levofloxacin (LEVAQUIN) 500 MG tablet Take 1 tablet (500 mg total) by mouth daily. For 5days 10/26/16   Domenic Polite, MD  oxyCODONE-acetaminophen (PERCOCET/ROXICET) 5-325 MG tablet Take 2 tablets by mouth every 6 (six) hours as needed for severe pain. 10/27/16   Curt Bears, MD    Family History No family history on file.  Social History Social History   Tobacco Use  . Smoking status: Former Smoker    Packs/day: 1.00    Types: Cigarettes    Last attempt to quit: 04/13/2016    Years since quitting: 0.9  . Smokeless tobacco: Never Used  Substance Use Topics  . Alcohol use: No  . Drug use:  No     Allergies   Patient has no known allergies.   Review of Systems Review of Systems  Constitutional: Negative for fever.  Respiratory: Positive for shortness of breath (chronic).   Cardiovascular: Positive for leg swelling (right thigh).  Musculoskeletal: Positive for myalgias.  Neurological: Negative for weakness and numbness.  All other systems reviewed and are negative.    Physical Exam Updated Vital Signs BP 108/67   Pulse 83   Temp 99 F (37.2 C) (Oral)   Resp 16   Ht 5\' 8"  (1.727 m)   SpO2 100%   BMI 20.68 kg/m   Physical Exam  Constitutional: He is oriented to person, place, and time. He appears well-developed. He appears cachectic.  HENT:  Head: Normocephalic and atraumatic.  Right Ear: External ear normal.  Left Ear: External ear normal.  Nose: Nose normal.  Eyes: Right eye exhibits no discharge. Left eye exhibits no discharge.  Neck: Neck supple.  Cardiovascular: Normal rate and regular rhythm.  Pulses:      Dorsalis pedis pulses are 2+ on the right side, and 2+ on the left side.  Pulmonary/Chest: Effort normal.  Abdominal: He exhibits no distension.  Musculoskeletal:       Right hip: He exhibits normal range of motion.       Right knee: He exhibits normal range of motion and no swelling. No tenderness found.       Right upper leg: He exhibits tenderness.       Right lower leg: He exhibits no tenderness and no swelling.       Legs: No calf swelling. Feet are warm/well perfused bilaterally. Normal sensation and movement.  Neurological: He is alert and oriented to person, place, and time.  Skin: Skin is warm and dry. He is not diaphoretic.  Nursing note and vitals reviewed.    ED Treatments / Results  Labs (all labs ordered are listed, but only abnormal results are displayed) Labs Reviewed  BASIC METABOLIC PANEL - Abnormal; Notable for the following components:      Result Value   Chloride 98 (*)    Calcium 12.8 (*)    All other  components within normal limits  CBC WITH DIFFERENTIAL/PLATELET - Abnormal; Notable for the following components:   RBC 3.46 (*)    Hemoglobin 10.1 (*)    HCT 31.5 (*)    Lymphs Abs 0.5 (*)    Monocytes Absolute 1.1 (*)    All other components within normal limits    EKG  EKG Interpretation None       Radiology Dg Femur Min 2 Views Right  Result Date: 03/18/2017 CLINICAL DATA:  Right upper leg pain since the patient fell in his kitchen 1 month ago. Initial encounter. EXAM: RIGHT FEMUR 2 VIEWS COMPARISON:  None. FINDINGS: There is no evidence of fracture or other focal bone lesions. Soft tissues are unremarkable. IMPRESSION: Negative exam. Electronically Signed   By: Inge Rise M.D.   On: 03/18/2017 10:26    Procedures Procedures (including critical care time)  Medications Ordered in ED Medications  sodium chloride 0.9 % bolus 1,000 mL (0 mLs Intravenous Stopped 03/18/17 1232)     Initial Impression / Assessment and Plan / ED Course  I have reviewed the triage vital signs and the nursing notes.  Pertinent labs & imaging results that were available during my care of the patient were reviewed by me and considered in my medical decision making (see chart for details).     Patient has no bony abnormalities on x-ray.  He is able to ambulate.  He is on a large dose of chronic pain medicine but otherwise does not appear to have any acute emergent condition.  This is been ongoing for 3 more more months.  It is not very tender and does feel like a soft tissue mass.  There is no skin changes such as redness or warmth.  I highly doubt infection, especially with length of time.  More likely this is a lipoma but he would need imaging to help rule out other changes such as like a cancerous mass.  However given the subacute nature I think this can be worked up as an outpatient an MRI would not be emergently indicated.  I do not think CT would show [place] much in the soft tissue realm as much  as MRI would.  Thus I recommended he follow-up with his PCP and/or oncologist for this testing.  He was noted to have a moderately elevated calcium.  Discussed increasing fluids, this is likely from his cancer.  He was given a dose of IV fluids and told to increase fluid intake at home and follow-up with his PCP/oncologist.  Return precautions.  Final Clinical Impressions(s) / ED Diagnoses   Final diagnoses:  Mass of right thigh  Hypercalcemia    ED Discharge Orders    None       Sherwood Gambler, MD 03/18/17 1322

## 2017-05-11 ENCOUNTER — Telehealth: Payer: Self-pay | Admitting: *Deleted

## 2017-05-11 NOTE — Telephone Encounter (Signed)
Received call from Creston Cellar, RN @ [facility]. Reporting :  Pt expired at home on  06/06/2017.

## 2017-05-12 ENCOUNTER — Telehealth: Payer: Self-pay | Admitting: Internal Medicine

## 2017-05-12 NOTE — Telephone Encounter (Signed)
Per note from nurse pt expired. Center informed by hospice. Status updated.

## 2017-05-28 ENCOUNTER — Other Ambulatory Visit: Payer: Self-pay | Admitting: Nurse Practitioner

## 2017-06-10 DEATH — deceased

## 2018-11-29 IMAGING — CT NM PET TUM IMG INITIAL (PI) SKULL BASE T - THIGH
8 series · 25 of 25 positions shown · non-contrast
Comparison: CT chest 04/15/2016.  Abdominopelvic CT 04/19/2016.

CLINICAL DATA: Initial treatment strategy for left lung
adenocarcinoma.

EXAM:
NUCLEAR MEDICINE PET SKULL BASE TO THIGH
TECHNIQUE: 7.48 mCi F-18 FDG was injected intravenously. Full-ring PET imaging
was performed from the skull base to thigh after the radiotracer. CT
data was obtained and used for attenuation correction and anatomic
localization.
FASTING BLOOD GLUCOSE:  Value: 117 mg/dl

[Series 3: pet sk_thigh ac · axial · 5.0mm · 4.07mm/px · z∈[-872,-0]mm · 4 of 219 slices shown]
[im 1/219]
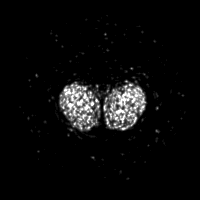
[im 73/219]
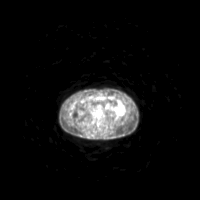
[im 146/219]
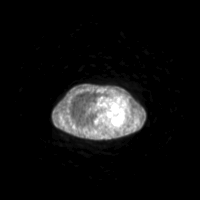
[im 219/219]
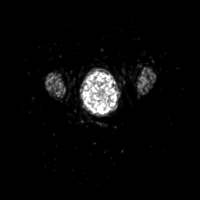

[Series 4: ct sk_thigh 5.0 b31f · axial · 5.0mm · 0.98mm/px · z∈[-872,-0]mm · 5 of 219 slices shown]
[im 1/219]
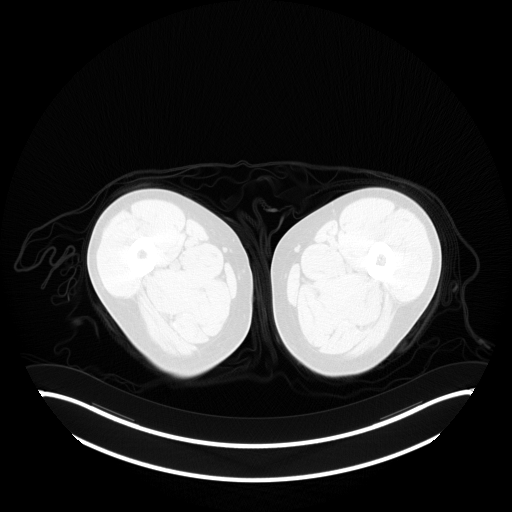
[im 55/219]
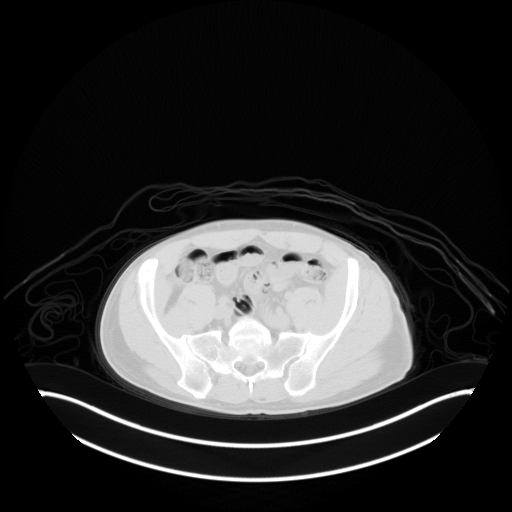
[im 110/219]
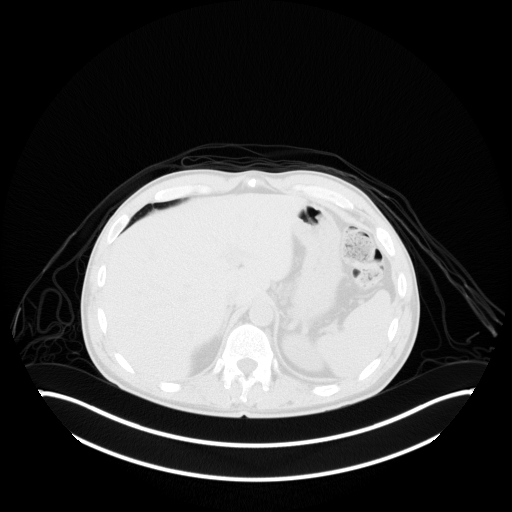
[im 164/219]
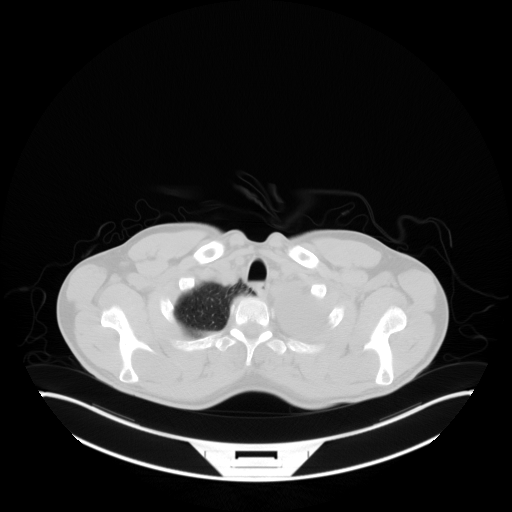
[im 219/219  brain]
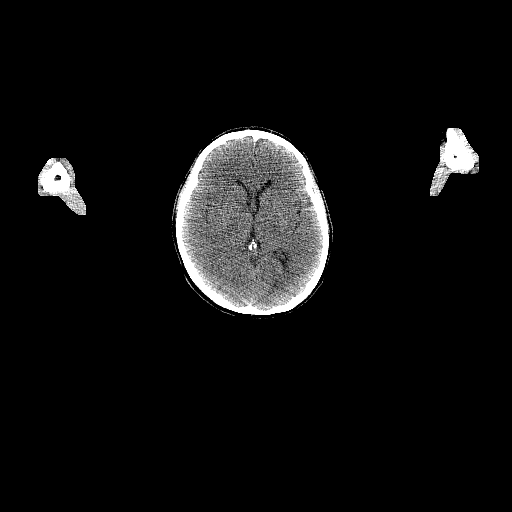

[Series 7: pet sk_thigh nac · axial · 5.0mm · 4.07mm/px · z∈[-872,-0]mm · 5 of 219 slices shown]
[im 1/219]
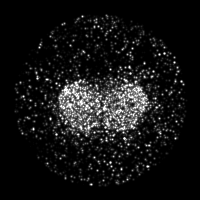
[im 55/219]
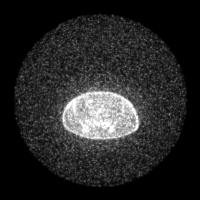
[im 110/219]
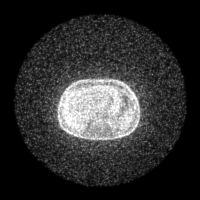
[im 164/219]
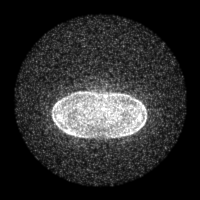
[im 219/219]
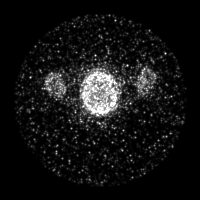

[Series 8: ct sk_thigh 5.0 b70f (id)_bone · axial · 5.0mm · 0.67mm/px · z∈[-448,-176]mm · 2 of 69 slices shown]
[im 1/69  bone]
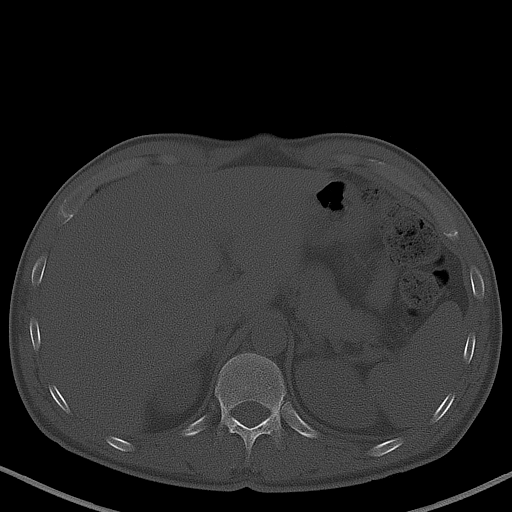
[im 69/69  bone]
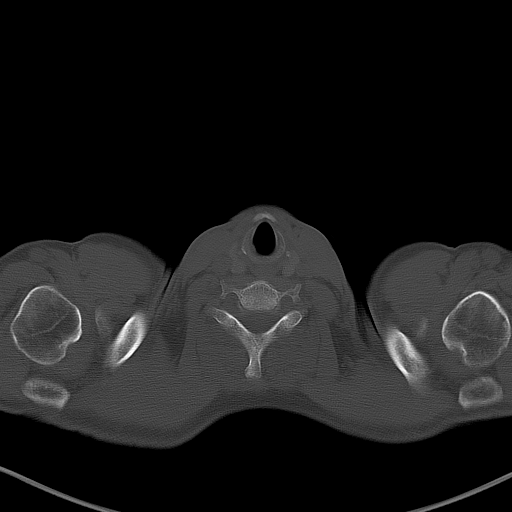

[Series 603: range-ct sk_thigh 5.0 (id)<alpha range> · 2 of 65 slices shown (1 of 2)]
[im 1/65]
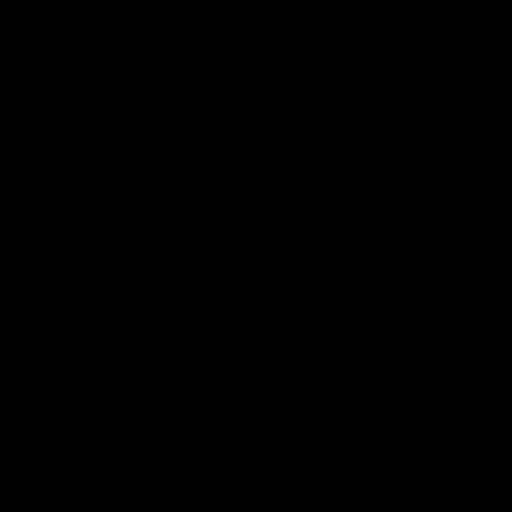
[im 65/65]
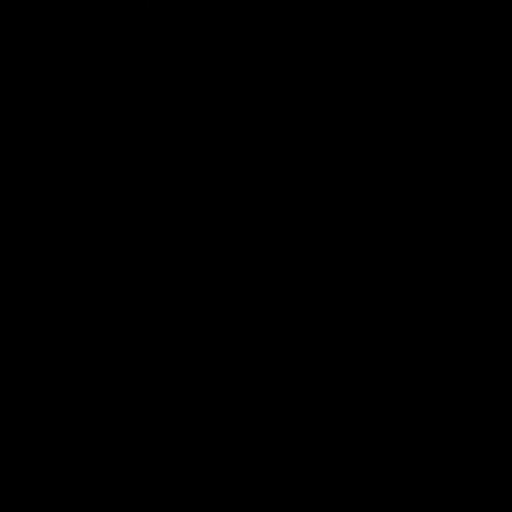

[Series 604: mip collection · coronal · 1.81mm/px · 1 of 32 slices shown]
[im 1/32]
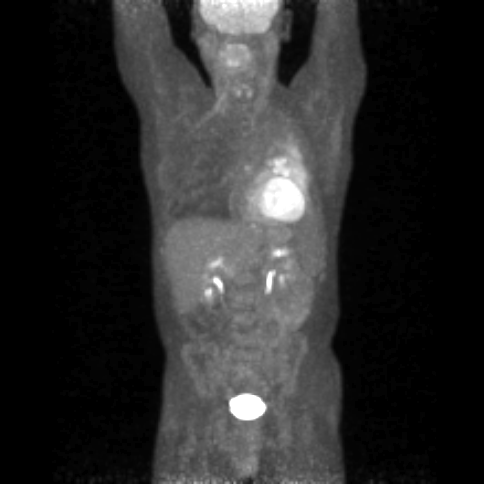

[Series 605: range-ct sk_thigh 5.0 (id)<alpha range> · 5 of 207 slices shown (2 of 2)]
[im 1/207]
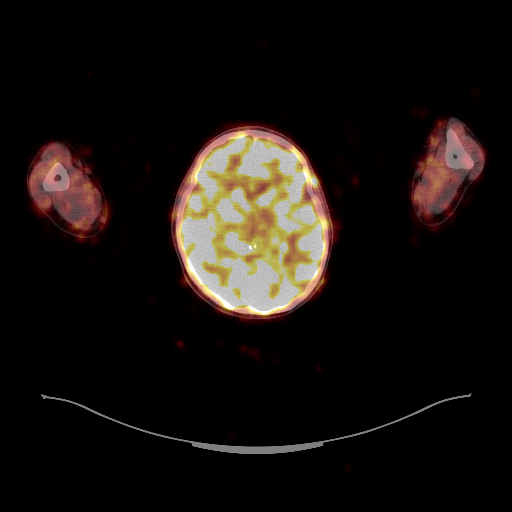
[im 52/207]
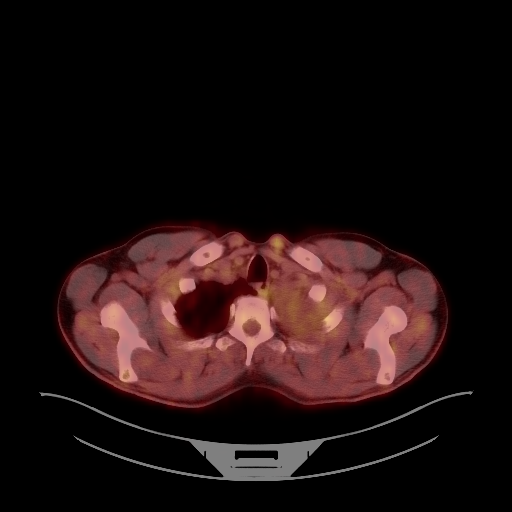
[im 104/207]
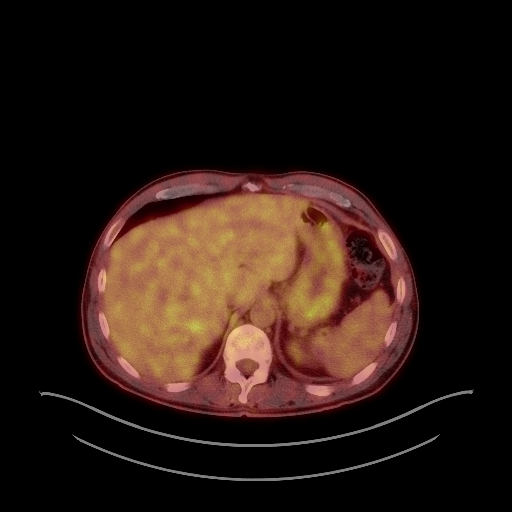
[im 155/207]
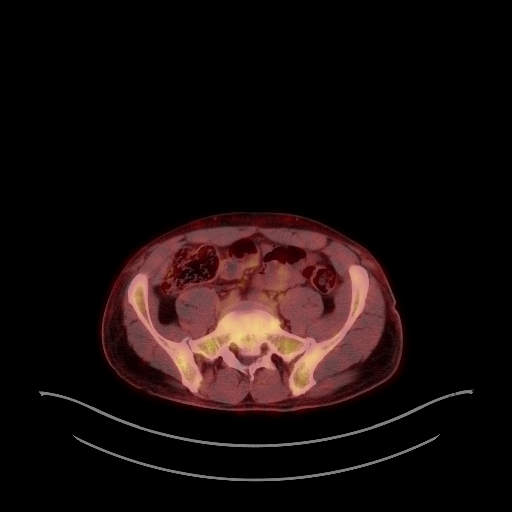
[im 207/207]
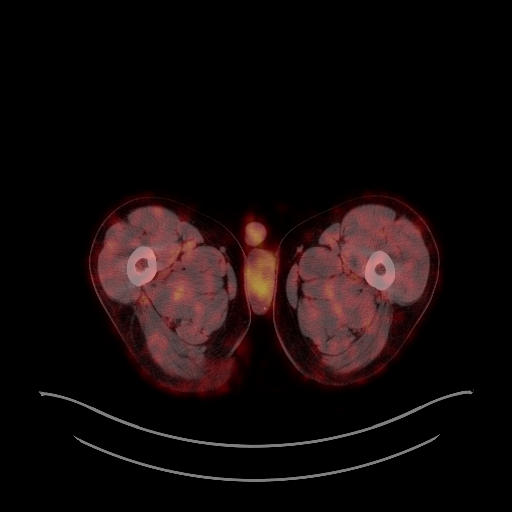

[Series 1032: results mm oncology reading · 1.01mm/px · 1 of 3 slices shown]
[im 1/3]
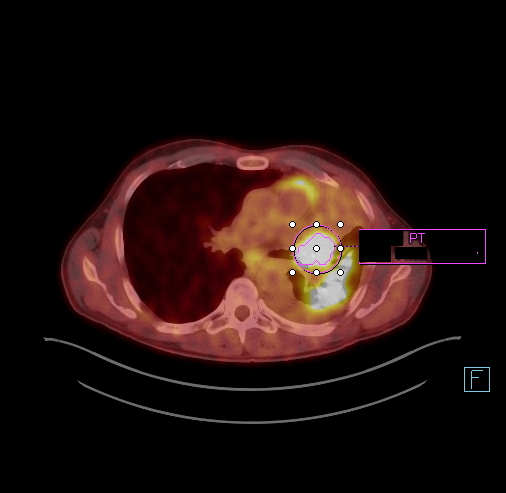

[25 of 25 positions shown; findings below may reference images not displayed]

FINDINGS: NECK

No hypermetabolic cervical lymph nodes are identified.There are no
lesions of the pharyngeal mucosal space.

CHEST

There is large hypermetabolic left hilar mass. This is not optimally
measured due to adjacent postobstructive pneumonitis, although is
estimated to measure approximately 3.9 x 3.5 cm on image 78. This
has an SUV max of 14.5. There is resulting complete left upper lobe
collapse and partial left lower lobe collapse. There are air
bronchograms throughout the left lower lobe with surrounding
parenchymal opacity demonstrating hypermetabolic activity, most
likely postobstructive pneumonitis. Of note, the left hilar mass is
in close proximity to the left ventricle, although demonstrates no
definite cardiac involvement. There is no pericardial effusion.
There is a dependent left pleural effusion without hypermetabolic
activity.

There are small hypermetabolic subcarinal (SUV max 4.7) and left
retro hilar (SUV max 6.2) lymph nodes. These are not pathologically
enlarged. No other definite hypermetabolic nodal activity seen
within the chest. There is some low-level activity near the
gastroesophageal junction which appears to be related to the
esophagus rather than adjacent mildly prominent lymph nodes. No
suspicious pulmonary activity within the right lung. The previously
noted ground-glass densities in the right upper lobe are less
evident. This difference may be technical.

ABDOMEN/PELVIS

There is no hypermetabolic activity within the liver, adrenal
glands, spleen or pancreas. There is no hypermetabolic nodal
activity. Mild aortic and branch vessel atherosclerosis.

SKELETON

There is no hypermetabolic activity to suggest osseous metastatic
disease.
IMPRESSION: 1. Large hypermetabolic left hilar mass with resulting left upper
lobe collapse and postobstructive pneumonitis in the left lower
lobe. Associated hypermetabolic left retro hilar and subcarinal
lymph nodes. By this examination, this is most consistent with stage
IIIA (T2a N2 M0) disease.
2. No suspicious abdominopelvic activity.
3. The previously demonstrated ground-glass densities in the right
upper lobe are less apparent on this study. Attention on follow-up
recommended.
